# Patient Record
Sex: Male | Born: 1955
Health system: Southern US, Community
[De-identification: ages and names within clinical notes are randomized; demographics above are authoritative.]

## PROBLEM LIST (undated history)

## (undated) DIAGNOSIS — F329 Major depressive disorder, single episode, unspecified: Secondary | ICD-10-CM

## (undated) DIAGNOSIS — M109 Gout, unspecified: Secondary | ICD-10-CM

## (undated) DIAGNOSIS — E079 Disorder of thyroid, unspecified: Secondary | ICD-10-CM

## (undated) DIAGNOSIS — F32A Depression, unspecified: Secondary | ICD-10-CM

## (undated) DIAGNOSIS — G709 Myoneural disorder, unspecified: Secondary | ICD-10-CM

## (undated) HISTORY — DX: Disorder of thyroid, unspecified: E07.9

## (undated) HISTORY — DX: Major depressive disorder, single episode, unspecified: F32.9

## (undated) HISTORY — PX: ANKLE ARTHROSCOPY WITH RECONSTRUCTION: SHX5583

## (undated) HISTORY — PX: TONSILLECTOMY AND ADENOIDECTOMY: SUR1326

## (undated) HISTORY — DX: Myoneural disorder, unspecified: G70.9

## (undated) HISTORY — DX: Depression, unspecified: F32.A

## (undated) HISTORY — PX: CHOLECYSTECTOMY: SHX55

---

## 1999-04-29 ENCOUNTER — Encounter: Payer: Self-pay | Admitting: Emergency Medicine

## 1999-04-29 ENCOUNTER — Inpatient Hospital Stay (HOSPITAL_COMMUNITY): Admission: EM | Admit: 1999-04-29 | Discharge: 1999-05-12 | Payer: Self-pay | Admitting: Emergency Medicine

## 1999-04-29 ENCOUNTER — Encounter (INDEPENDENT_AMBULATORY_CARE_PROVIDER_SITE_OTHER): Payer: Self-pay | Admitting: *Deleted

## 1999-05-01 ENCOUNTER — Encounter: Payer: Self-pay | Admitting: Family Medicine

## 1999-05-02 ENCOUNTER — Encounter: Payer: Self-pay | Admitting: Thoracic Surgery (Cardiothoracic Vascular Surgery)

## 1999-05-03 ENCOUNTER — Encounter: Payer: Self-pay | Admitting: Cardiology

## 1999-05-03 ENCOUNTER — Encounter: Payer: Self-pay | Admitting: Family Medicine

## 1999-05-04 ENCOUNTER — Encounter: Payer: Self-pay | Admitting: Thoracic Surgery (Cardiothoracic Vascular Surgery)

## 1999-05-05 ENCOUNTER — Encounter: Payer: Self-pay | Admitting: Thoracic Surgery (Cardiothoracic Vascular Surgery)

## 1999-05-06 ENCOUNTER — Encounter: Payer: Self-pay | Admitting: Thoracic Surgery (Cardiothoracic Vascular Surgery)

## 1999-05-07 ENCOUNTER — Encounter: Payer: Self-pay | Admitting: Thoracic Surgery (Cardiothoracic Vascular Surgery)

## 1999-05-08 ENCOUNTER — Encounter: Payer: Self-pay | Admitting: Thoracic Surgery (Cardiothoracic Vascular Surgery)

## 1999-05-08 ENCOUNTER — Encounter: Payer: Self-pay | Admitting: Family Medicine

## 1999-05-18 ENCOUNTER — Encounter: Admission: RE | Admit: 1999-05-18 | Discharge: 1999-05-18 | Payer: Self-pay | Admitting: Family Medicine

## 1999-10-07 ENCOUNTER — Encounter: Payer: Self-pay | Admitting: *Deleted

## 1999-10-07 ENCOUNTER — Inpatient Hospital Stay (HOSPITAL_COMMUNITY): Admission: EM | Admit: 1999-10-07 | Discharge: 1999-10-10 | Payer: Self-pay | Admitting: Emergency Medicine

## 1999-10-07 ENCOUNTER — Encounter: Payer: Self-pay | Admitting: Emergency Medicine

## 1999-10-19 ENCOUNTER — Encounter: Admission: RE | Admit: 1999-10-19 | Discharge: 1999-10-19 | Payer: Self-pay | Admitting: Family Medicine

## 1999-11-07 ENCOUNTER — Inpatient Hospital Stay (HOSPITAL_COMMUNITY): Admission: EM | Admit: 1999-11-07 | Discharge: 1999-11-08 | Payer: Self-pay | Admitting: Emergency Medicine

## 1999-11-07 ENCOUNTER — Encounter: Payer: Self-pay | Admitting: Emergency Medicine

## 1999-11-15 ENCOUNTER — Encounter: Admission: RE | Admit: 1999-11-15 | Discharge: 1999-11-15 | Payer: Self-pay | Admitting: Family Medicine

## 1999-11-23 ENCOUNTER — Encounter: Admission: RE | Admit: 1999-11-23 | Discharge: 1999-11-23 | Payer: Self-pay | Admitting: Family Medicine

## 1999-11-28 ENCOUNTER — Encounter: Admission: RE | Admit: 1999-11-28 | Discharge: 1999-11-28 | Payer: Self-pay | Admitting: Family Medicine

## 1999-11-29 ENCOUNTER — Encounter: Admission: RE | Admit: 1999-11-29 | Discharge: 1999-11-29 | Payer: Self-pay | Admitting: Family Medicine

## 1999-12-07 ENCOUNTER — Encounter: Admission: RE | Admit: 1999-12-07 | Discharge: 1999-12-07 | Payer: Self-pay | Admitting: Family Medicine

## 1999-12-21 ENCOUNTER — Encounter: Admission: RE | Admit: 1999-12-21 | Discharge: 1999-12-21 | Payer: Self-pay | Admitting: Family Medicine

## 1999-12-28 ENCOUNTER — Encounter: Admission: RE | Admit: 1999-12-28 | Discharge: 1999-12-28 | Payer: Self-pay | Admitting: Family Medicine

## 2000-01-04 ENCOUNTER — Encounter: Admission: RE | Admit: 2000-01-04 | Discharge: 2000-01-04 | Payer: Self-pay | Admitting: Family Medicine

## 2000-01-15 ENCOUNTER — Encounter: Admission: RE | Admit: 2000-01-15 | Discharge: 2000-01-15 | Payer: Self-pay | Admitting: Family Medicine

## 2000-01-18 ENCOUNTER — Encounter: Admission: RE | Admit: 2000-01-18 | Discharge: 2000-01-18 | Payer: Self-pay | Admitting: Family Medicine

## 2000-01-25 ENCOUNTER — Encounter: Admission: RE | Admit: 2000-01-25 | Discharge: 2000-01-25 | Payer: Self-pay | Admitting: Family Medicine

## 2000-02-01 ENCOUNTER — Encounter: Admission: RE | Admit: 2000-02-01 | Discharge: 2000-02-01 | Payer: Self-pay | Admitting: Family Medicine

## 2000-02-08 ENCOUNTER — Encounter: Admission: RE | Admit: 2000-02-08 | Discharge: 2000-02-08 | Payer: Self-pay | Admitting: Family Medicine

## 2000-02-14 ENCOUNTER — Encounter: Admission: RE | Admit: 2000-02-14 | Discharge: 2000-02-14 | Payer: Self-pay | Admitting: Family Medicine

## 2000-02-21 ENCOUNTER — Encounter: Admission: RE | Admit: 2000-02-21 | Discharge: 2000-02-21 | Payer: Self-pay | Admitting: Family Medicine

## 2000-02-29 ENCOUNTER — Encounter: Admission: RE | Admit: 2000-02-29 | Discharge: 2000-02-29 | Payer: Self-pay | Admitting: Family Medicine

## 2000-03-05 ENCOUNTER — Encounter: Admission: RE | Admit: 2000-03-05 | Discharge: 2000-03-05 | Payer: Self-pay | Admitting: Family Medicine

## 2000-03-07 ENCOUNTER — Encounter: Admission: RE | Admit: 2000-03-07 | Discharge: 2000-03-07 | Payer: Self-pay | Admitting: Family Medicine

## 2000-03-14 ENCOUNTER — Encounter: Admission: RE | Admit: 2000-03-14 | Discharge: 2000-03-14 | Payer: Self-pay | Admitting: Family Medicine

## 2000-03-25 ENCOUNTER — Emergency Department (HOSPITAL_COMMUNITY): Admission: EM | Admit: 2000-03-25 | Discharge: 2000-03-26 | Payer: Self-pay | Admitting: Internal Medicine

## 2000-03-25 ENCOUNTER — Encounter: Payer: Self-pay | Admitting: Emergency Medicine

## 2000-07-30 ENCOUNTER — Emergency Department (HOSPITAL_COMMUNITY): Admission: EM | Admit: 2000-07-30 | Discharge: 2000-07-30 | Payer: Self-pay | Admitting: Emergency Medicine

## 2000-09-05 ENCOUNTER — Encounter: Payer: Self-pay | Admitting: Emergency Medicine

## 2000-09-05 ENCOUNTER — Emergency Department (HOSPITAL_COMMUNITY): Admission: EM | Admit: 2000-09-05 | Discharge: 2000-09-05 | Payer: Self-pay | Admitting: *Deleted

## 2000-09-21 ENCOUNTER — Emergency Department (HOSPITAL_COMMUNITY): Admission: EM | Admit: 2000-09-21 | Discharge: 2000-09-21 | Payer: Self-pay | Admitting: Emergency Medicine

## 2001-01-01 ENCOUNTER — Encounter: Payer: Self-pay | Admitting: Emergency Medicine

## 2001-01-01 ENCOUNTER — Emergency Department (HOSPITAL_COMMUNITY): Admission: EM | Admit: 2001-01-01 | Discharge: 2001-01-01 | Payer: Self-pay | Admitting: Emergency Medicine

## 2001-02-27 ENCOUNTER — Encounter: Payer: Self-pay | Admitting: Emergency Medicine

## 2001-02-27 ENCOUNTER — Emergency Department (HOSPITAL_COMMUNITY): Admission: EM | Admit: 2001-02-27 | Discharge: 2001-02-27 | Payer: Self-pay | Admitting: Emergency Medicine

## 2001-08-01 ENCOUNTER — Emergency Department (HOSPITAL_COMMUNITY): Admission: EM | Admit: 2001-08-01 | Discharge: 2001-08-01 | Payer: Self-pay | Admitting: Emergency Medicine

## 2001-08-01 ENCOUNTER — Encounter: Payer: Self-pay | Admitting: Emergency Medicine

## 2003-10-11 ENCOUNTER — Emergency Department (HOSPITAL_COMMUNITY): Admission: EM | Admit: 2003-10-11 | Discharge: 2003-10-11 | Payer: Self-pay | Admitting: Emergency Medicine

## 2003-10-15 ENCOUNTER — Inpatient Hospital Stay (HOSPITAL_COMMUNITY): Admission: RE | Admit: 2003-10-15 | Discharge: 2003-10-25 | Payer: Self-pay | Admitting: Psychiatry

## 2003-11-20 ENCOUNTER — Other Ambulatory Visit: Payer: Self-pay

## 2004-02-28 ENCOUNTER — Ambulatory Visit: Payer: Self-pay | Admitting: Family Medicine

## 2004-02-28 ENCOUNTER — Ambulatory Visit: Payer: Self-pay | Admitting: *Deleted

## 2004-06-08 ENCOUNTER — Emergency Department (HOSPITAL_COMMUNITY): Admission: EM | Admit: 2004-06-08 | Discharge: 2004-06-08 | Payer: Self-pay | Admitting: Emergency Medicine

## 2004-06-09 ENCOUNTER — Ambulatory Visit: Payer: Self-pay | Admitting: Family Medicine

## 2004-06-10 ENCOUNTER — Emergency Department (HOSPITAL_COMMUNITY): Admission: EM | Admit: 2004-06-10 | Discharge: 2004-06-10 | Payer: Self-pay | Admitting: Family Medicine

## 2004-06-14 ENCOUNTER — Ambulatory Visit: Payer: Self-pay | Admitting: Family Medicine

## 2004-06-16 ENCOUNTER — Emergency Department (HOSPITAL_COMMUNITY): Admission: EM | Admit: 2004-06-16 | Discharge: 2004-06-16 | Payer: Self-pay | Admitting: Family Medicine

## 2004-09-28 ENCOUNTER — Emergency Department (HOSPITAL_COMMUNITY): Admission: EM | Admit: 2004-09-28 | Discharge: 2004-09-28 | Payer: Self-pay | Admitting: Emergency Medicine

## 2004-09-28 ENCOUNTER — Ambulatory Visit: Payer: Self-pay | Admitting: Family Medicine

## 2004-09-28 ENCOUNTER — Ambulatory Visit (HOSPITAL_COMMUNITY): Admission: RE | Admit: 2004-09-28 | Discharge: 2004-09-28 | Payer: Self-pay | Admitting: Family Medicine

## 2004-10-02 ENCOUNTER — Ambulatory Visit: Payer: Self-pay | Admitting: Family Medicine

## 2004-10-13 ENCOUNTER — Emergency Department (HOSPITAL_COMMUNITY): Admission: EM | Admit: 2004-10-13 | Discharge: 2004-10-13 | Payer: Self-pay | Admitting: Emergency Medicine

## 2004-10-16 ENCOUNTER — Ambulatory Visit: Payer: Self-pay | Admitting: Family Medicine

## 2004-10-21 ENCOUNTER — Emergency Department (HOSPITAL_COMMUNITY): Admission: EM | Admit: 2004-10-21 | Discharge: 2004-10-21 | Payer: Self-pay | Admitting: Family Medicine

## 2004-10-25 ENCOUNTER — Ambulatory Visit: Payer: Self-pay | Admitting: Family Medicine

## 2004-11-07 ENCOUNTER — Emergency Department (HOSPITAL_COMMUNITY): Admission: EM | Admit: 2004-11-07 | Discharge: 2004-11-07 | Payer: Self-pay | Admitting: Emergency Medicine

## 2004-11-15 ENCOUNTER — Ambulatory Visit: Payer: Self-pay | Admitting: Nurse Practitioner

## 2004-11-22 ENCOUNTER — Ambulatory Visit: Payer: Self-pay | Admitting: Family Medicine

## 2004-12-01 ENCOUNTER — Emergency Department (HOSPITAL_COMMUNITY): Admission: EM | Admit: 2004-12-01 | Discharge: 2004-12-02 | Payer: Self-pay | Admitting: Emergency Medicine

## 2004-12-02 ENCOUNTER — Emergency Department (HOSPITAL_COMMUNITY): Admission: EM | Admit: 2004-12-02 | Discharge: 2004-12-03 | Payer: Self-pay | Admitting: Emergency Medicine

## 2004-12-07 ENCOUNTER — Inpatient Hospital Stay (HOSPITAL_COMMUNITY): Admission: EM | Admit: 2004-12-07 | Discharge: 2004-12-14 | Payer: Self-pay | Admitting: Psychiatry

## 2004-12-07 ENCOUNTER — Ambulatory Visit: Payer: Self-pay | Admitting: Psychiatry

## 2004-12-11 ENCOUNTER — Encounter: Payer: Self-pay | Admitting: Psychiatry

## 2007-06-29 ENCOUNTER — Emergency Department (HOSPITAL_COMMUNITY): Admission: EM | Admit: 2007-06-29 | Discharge: 2007-06-29 | Payer: Self-pay | Admitting: Family Medicine

## 2007-07-30 ENCOUNTER — Ambulatory Visit: Payer: Self-pay | Admitting: Vascular Surgery

## 2007-07-30 ENCOUNTER — Encounter (INDEPENDENT_AMBULATORY_CARE_PROVIDER_SITE_OTHER): Payer: Self-pay | Admitting: Emergency Medicine

## 2007-07-31 ENCOUNTER — Inpatient Hospital Stay (HOSPITAL_COMMUNITY): Admission: EM | Admit: 2007-07-31 | Discharge: 2007-08-02 | Payer: Self-pay | Admitting: Emergency Medicine

## 2007-08-01 ENCOUNTER — Encounter (INDEPENDENT_AMBULATORY_CARE_PROVIDER_SITE_OTHER): Payer: Self-pay | Admitting: Internal Medicine

## 2007-08-02 ENCOUNTER — Ambulatory Visit: Payer: Self-pay | Admitting: Psychiatry

## 2007-08-02 ENCOUNTER — Inpatient Hospital Stay (HOSPITAL_COMMUNITY): Admission: AD | Admit: 2007-08-02 | Discharge: 2007-08-06 | Payer: Self-pay | Admitting: Psychiatry

## 2007-08-14 ENCOUNTER — Emergency Department (HOSPITAL_COMMUNITY): Admission: EM | Admit: 2007-08-14 | Discharge: 2007-08-14 | Payer: Self-pay | Admitting: Emergency Medicine

## 2008-07-25 ENCOUNTER — Emergency Department (HOSPITAL_COMMUNITY): Admission: EM | Admit: 2008-07-25 | Discharge: 2008-07-25 | Payer: Self-pay | Admitting: Emergency Medicine

## 2008-08-01 ENCOUNTER — Emergency Department (HOSPITAL_COMMUNITY): Admission: EM | Admit: 2008-08-01 | Discharge: 2008-08-01 | Payer: Self-pay | Admitting: Emergency Medicine

## 2010-06-25 ENCOUNTER — Encounter: Payer: Self-pay | Admitting: Family Medicine

## 2010-10-17 NOTE — H&P (Signed)
NAME:  Paul Jefferson, Paul Jefferson NO.:  [id number]   MEDICAL RECORD NO.:  [id number]          PATIENT TYPE:  IPS   LOCATION:  0503                          FACILITY:  [facility]   PHYSICIAN:  Geoffery Lyons, M.D.      DATE OF BIRTH:  [birth year]   DATE OF ADMISSION:  08/02/2007  DATE OF DISCHARGE:                       PSYCHIATRIC ADMISSION ASSESSMENT   IDENTIFYING INFORMATION:  This is a voluntary admission to the services  of Dr. Geoffery Lyons.  This is a 55 year old single white male.  He was  admitted to the main hospital February 25 to February 28.  He had a  syncopal attack and he fell, enduring lacerations to his lower legs with  cellulitis.  He was admitted for antibiotic treatment, fluid treatment  of mild rhabdomyolysis, and is being transferred to the [facility] at this time for medication adjustment.  The patient was  seen in consult on February 26 by Dr. Geralyn Flash.  The patient is  well known to him, and at that time he was also treated for alcohol and  benzodiazepines dependence.  He was discharged on Campral, Seroquel,  Prozac, and was referred to [facility] at that  time for further followup.   PAST PSYCHIATRIC HISTORY:  He has a very long history for alcohol abuse  and infrequently attends [facility].  He has had  inpatient and outpatient treatment for years.   SOCIAL HISTORY:  He finished high school, he has never married, he has  no children.  He states he is currently employed and that he lives in an  apartment.  He frequently lives with and gets financial support from his  parents.   FAMILY HISTORY:  Depression.   ALCOHOL AND DRUG ABUSE:  He states his last use of alcohol was 6-8  months ago.   MEDICATIONS:  At the time of transfer from the hospital:  He was on  doxycycline 100 mg p.o. b.i.d. for 10 more days to treat his  cellulitis, Naprosyn enteric coated 500 mg p.o. b.i.d. for 2 more days  and then b.i.d. p.r.n. with food, Percocet 2 p.o. q.4-6h p.r.n.,  Protonix 40 mg p.o. daily, Prozac 20 mg p.o. daily, Klonopin 2 mg p.o.  daily, Seroquel 150 mg at h.s., and Synthroid 200 mcg p.o. daily.  He  was also to have wet to dry dressing changes to his right great toe  b.i.d. and he is to keep his legs elevated when in bed.   ALLERGIES:  No known drug allergies.   POSITIVE PHYSICAL FINDINGS:  PHYSICAL EXAMINATION:  They are well  documented and already on the chart.  Vital signs on admission show he  is 73 inches tall.  He weighs 240.5 pounds.  Temperature is 97.4, blood  pressure is 101/59 to 90/51, pulse is 72, respirations are 18.   MENTAL STATUS EXAM:  He is easily aroused.  He is appropriately groomed,  dressed and nourished.  His speech is not pressured.  His mood is  appropriate to the situation.  He affect has a normal range.  Thought  processes are clear, rational and goal oriented.  Judgment and insight  are good.  Concentration and memory are good.  Intelligence is at least  average.  He denies being suicidal or homicidal.  He denies auditory or  visual hallucinations.   ADMISSION DIAGNOSES:  AXIS I:  Alcohol and benzodiazepines dependence.  He also has major depressive disorder recurrent, without psychotic  features.  AXIS II:  Deferred.  AXIS III:  Cellulitis status post syncopal fall, hypothyroidism,  hypercholesterolemia, rhabdomyolysis, resolved, obesity.  AXIS IV:  Severe.  AXIS V:  Global assessment of function is 50.   PLAN:  The patient is to be detoxed from benzodiazepines and alcohol and  toward that end the low dose Librium protocol was initiated.  Other  medication adjustments will be made while on the floor, and he will  return to the [facility] for further followup  post discharge.   ESTIMATED LENGTH OF STAY:  Another 4-5 days.      Mickie Leonarda Salon, P.A.-C.      Geoffery Lyons, M.D.  Electronically Signed     MD/MEDQ  D:  08/03/2007  T:  08/03/2007  Job:  [id number]

## 2010-10-17 NOTE — H&P (Signed)
NAME:  Paul Jefferson, Paul Jefferson NO.:  [id number]   MEDICAL RECORD NO.:  [id number]          PATIENT TYPE:  OBV   LOCATION:  0107                         FACILITY:  [facility]   PHYSICIAN:  Hollice Espy, M.D.DATE OF BIRTH:  1955-12-18   DATE OF ADMISSION:  07/30/2007  DATE OF DISCHARGE:                              HISTORY & PHYSICAL   PRIMARY CARE PHYSICIAN:  Vikki Ports, M.D.   CONSULTING PHYSICIAN:  Antonietta Breach, M.D.   CHIEF COMPLAINT:  Fall.   HISTORY OF PRESENT ILLNESS:  The patient is a 55 year old white male  with past medical history of hypothyroidism, tobacco use, and  psychiatric disorder not otherwise specified who has been having  problems with his medications. He says his medications are constantly  adjusted and he at times gets very light-headed and dizzy and falls. He  fell yesterday leading to lacerations on both legs. He had planned to go  to [facility] for medication adjustments, but his legs started to  hurt so badly today that he came into the emergency room. In the  emergency room , he was evaluated and thought to perhaps have a  cellulitis. Labs were done on the patient and he was found to have a  white count of 16 as well as elevated CPK levels consistent with a fall.  The patient had an elevated D-dimer of 0.83; however, Doppler's  confirmed there was no evidence of deep venous thrombosis. With these  findings, it was felt the patient likely had a cellulitis secondary to  laceration and would need that to be treated before he could go over to  [facility]. Currently, he is doing well. He complains of some  mild leg pain but denies any headaches, vision changes, or dysphagia. No  chest pain, palpitations, shortness of breath, wheeze, cough, abdominal  pain, hematuria, dysuria, constipation, diarrhea, focal extremity  numbness, weakness, or pain other than described above.   REVIEW OF SYSTEMS:  Otherwise negative.   PAST  MEDICAL HISTORY:  Hypothyroidism, tobacco abuse, and psychiatric  disorder not otherwise specified.   MEDICATIONS:  1. Klonopin 2.  2. Daily Synthroid 200 mcg.  3. Daily Prozac 20.  4. Daily Seroquel 150 q.h.s.   ALLERGIES:  NO KNOWN DRUG ALLERGIES.   SOCIAL HISTORY:  He smokes about a half pack per day.   FAMILY HISTORY:  Noncontributory.   PHYSICAL EXAMINATION:  VITAL SIGNS: Patient's vitals on admission:  Temperature 98.6, heart rate 81, blood pressure 119/64, respirations 20,  02 saturation 97% on room air.  GENERAL: Alert and oriented x3, in no apparent distress.  HEENT:  He is normocephalic, atraumatic. His mucous membranes are  slightly dry. He has no carotid bruits.  HEART:  Regular rate and rhythm, S1 and S2.  LUNGS:  Clear to auscultation bilaterally.  ABDOMEN:  Soft, nontender, nondistended. Positive bowel sounds.  EXTREMITIES: He has evidence of 2 recent fresh lacerations, one about 9  to 10 inches over the left anterior leg below the knee, the other about  6 inches on the right anterior lower extremity below the knee. They  are  slightly tender to touch.   LAB WORK:  White count 16.1, H&H 10.9 and 31, MCV 90, platelet count  316, no shift. D-dimer is elevated at 0.83. CPK 341. MB 11.7. Troponin  less than 0.05. CPK 527. MB 11.8, troponin 0.03. Sodium 134, potassium  3.8, chloride 101, bicarb 24, BUN 17, creatinine 1.3, glucose 116.   ASSESSMENT/PLAN:  1. Fall with leg laceration leading to cellulitis. Treat with IV      Avelox and Doxycycline to cover both community-acquired methicillin-      resistant Staph aureus and gram negatives.  2. Psychiatric disturbance: The patient wants to go to Behavior Health      for medication adjustments. We will get Psych to see here. Social      Work consult to transfer over once white count is normalized.  3. Hypothyroidism: Continue Synthroid.  4. Tobacco abuse: Continue nicotine patch.  5. Increased CPK secondary to fall,  treat with IV fluids.      Hollice Espy, M.D.  Electronically Signed     SKK/MEDQ  D:  07/30/2007  T:  07/30/2007  Job:  [id number]   cc:   Vikki Ports, M.D.  Fax: [phone]   Antonietta Breach, M.D.

## 2010-10-17 NOTE — Discharge Summary (Signed)
NAME:  Paul Jefferson, Paul Jefferson NO.:  [id number]   MEDICAL RECORD NO.:  [id number]          PATIENT TYPE:  INP   LOCATION:  1531                         FACILITY:  [facility]   PHYSICIAN:  Kela Millin, M.D.DATE OF BIRTH:  29-Mar-1956   DATE OF ADMISSION:  07/30/2007  DATE OF DISCHARGE:                               DISCHARGE SUMMARY   DISCHARGE DIAGNOSES:  1. Probable syncope with fall.  2. Leg lacerations with cellulitis.  3. Major depressive disorder, recurrent - without psychotic features      per psychiatrist.  4. History of alcohol and benzodiazepine dependence.  5. Rhabdomyolysis, mild.  6. Hypothyroidism.  7. Tobacco abuse.  8. Left lower lobe pleural parenchymal thickening, band like - per CT      scan.  Follow-up CT scan of chest in three months per primary care      physician.   PROCEDURES AND STUDIES:  1. CT angiogram of chest - no evidence of pulmonary embolism.  Band-      like pleural parenchymal thickening in the superior segment of the      left lower lobe likely a chronic process.  However, recommend      follow-up CT of thorax in three months to assess interval growth      and exclude malignancy.  2. 2-D echocardiogram - normal left ventricular systolic function.      Ejection fraction 55-60%, no evidence of regional wall motion      abnormalities.  3. Carotid Doppler ultrasound done in December 2008 - no significant      ICA stenosis.  Bilateral mild intimal wall changes of the common      carotid.  4. Doppler ultrasound of lower extremities - no DVT.   CONSULTATIONS:  Psychiatry, Dr. Electa Sniff.   BRIEF HISTORY:  The patient is a 55 year old white male with above  listed medical problems who presented status post with lacerations on  both legs and swelling.  He wanted to go to [facility] for  adjustment of his medications, but he was having so much pain in his  legs that he decided to come to the ER to be evaluated.  In the ER he  was  found to have a white cell count of 16 and his CPK was elevated at  527.  He had Doppler ultrasound of his leg which showed no evidence of a  DVT.  He was admitted for further evaluation and management.   Please see the full admission History and Physical dictated on July 30, 2007 by Dr. Rito Ehrlich for the details of his admission physical exam  as well as the laboratory data.   HOSPITAL COURSE:  1. Probable syncope with fall - upon admission the patient had a D-      dimer done which was noted to be elevated at 0.83.  Initially lower      extremity ultrasounds of his legs were done, and these were      negative for a DVT.  A CT angiogram was later done that was      negative for pulmonary  embolus.  Workup for syncope included serial      cardiac enzymes which revealed normal troponins.  His total CPK's      were elevated, and this was attributed to rhabdomyolysis secondary      to his fall.  With hydration the rhabdomyolysis resolved.  A 2-D      echocardiogram was done to further evaluate the possible syncope      which was within normal limits as above.  It was noted that the      patient had previously been seen at [facility] for possible stroke in      December, and at that point had carotid Doppler ultrasounds which      showed no significant ICA stenosis.  The patient has not had any      further syncopal episodes here in the hospital.  The impression was      that his falls might have been related to dizziness that he has      been complaining of as a result of his psychiatric medications and      for which reason he needs to go to [facility] for adjustment      of his medications.  2. Fall with leg lacerations and cellulitis - the patient was started      on empiric antibiotics on admission.  It was noted that his white      cell count was elevated at 16.1 on admission.  With the IV      antibiotics his leukocytosis has improved to 11.2 on last check      prior to  discharge.  The patient has remained afebrile, and the      erythema in his legs improved.  His still has some pain from the      fall/lacerations, and is to continue pain medications upon      discharge.  3. Mild rhabdomyolysis - the patient was hydrated with IV fluids when      his total CPK reduced on recheck.  4. Hypothyroidism - the patient was maintained on his outpatient      medications during this hospital stay.  5. Major depressive disorder.  Recurrent alcohol and benzodiazepine      dependence - psychiatry was consulted, and Dr. Electa Sniff saw the      patient and recommended continuing his usual Prozac and also for      now his Xanax and Klonopin.  He stated that once the patient was      transferred to [facility] he would go through the      detoxification.  Dr. Electa Sniff also recommended to continue his usual Seroquel.   DISCHARGE MEDICATIONS:  1. Doxycycline 100 mg p.o. b.i.d. x10 more days.  2. Naprosyn enteric-coated 500 mg p.o. b.i.d. x2 days and then b.i.d.      p.r.n. with food.  3. Percocet two p.o. q.4-6 h. p.r.n.  4. Protonix 40 mg p.o. daily.  5. Prozac 20 mg p.o. daily.  6. Klonopin 2 mg p.o. daily.  7. Seroquel 150 mg p.o. q.h.s.  8. Synthroid 200 mg p.o. daily.   SPECIAL INSTRUCTIONS:  1. Wet to dry dressing changes to the one on his right great toe      b.i.d.  2. The patient is to keep his legs elevated when in bed.   DISCHARGE CONDITION:  Improved, stable.   FOLLOWUP CARE:  The patient is to follow up with Dr. Theresia Lo upon  discharge from [facility].  He is to call for an appointment.      Kela Millin, M.D.  Electronically Signed     ACV/MEDQ  D:  08/02/2007  T:  08/02/2007  Job:  [id number]   cc:   Vikki Ports, M.D.  Fax: [phone]

## 2010-10-17 NOTE — Consult Note (Signed)
NAME:  Paul Jefferson, Paul Jefferson NO.:  [id number]   MEDICAL RECORD NO.:  [id number]          PATIENT TYPE:  INP   LOCATION:  1531                         FACILITY:  [facility]   PHYSICIAN:  Anselm Jungling, MD  DATE OF BIRTH:  11/05/1955   DATE OF CONSULTATION:  07/31/2007  DATE OF DISCHARGE:                                 CONSULTATION   IDENTIFYING DATA AND REASON FOR REFERRAL:  The patient is a 55 year old  with cellulitis here at [facility].  He had been scheduled to  go into the [facility] for stabilization of mood disorder,  but he developed a cellulitis in the meantime and therefore is admitted  here to [facility] with the plan to transfer to [facility] when he is medically stable here.  Psychiatric consultation is  requested because of his status in which he is pending transfer to the  psychiatric facility.   HISTORY OF PRESENT ILLNESS:  The patient is well-known to me, having  been under my care in the inpatient psychiatric service approximately 20  months ago.  At that time, he was treated for alcohol and benzodiazepine  dependence and was discharged on a regimen of Campral, Seroquel, Prozac,  and referred to [facility] for further followup.   Today, the patient states that although he has had many followup visits  at [facility], that he continues to feel ongoing  problems with his moods, particularly anxiety and agitation.  He does  not feel that his medications are currently correct.   CURRENT PSYCHOTROPICS:  1. Prozac 20 mg daily.  2. Klonopin 2 mg daily.  3. Seroquel 150 mg q.h.s.  4. Xanax 0.5 mg t.i.d.   It is not clear why he is on both Xanax and Klonopin.   PAST MEDICAL HISTORY:  As above.  The patient also has a history of  hypothyroidism and hypercholesterolemia.   MENTAL STATUS EXAM:  The patient is a tall, paunchy gentleman who  appears well-nourished and normally  developed. __________ recently from  his treatment 2 years ago, but estimates that that was actually 5 years  ago, which is not the case.  He is not displaying any signs or symptoms  of psychosis or thought disorder.  His mood is depressed with dysphoric  and anxious affect.  When I let him know that the plan is for him to  transfer to [facility] once he is medically stabilized, he  expresses relief and gratitude.  There are no delusional statements.  There is nothing to suggest any underlying psychosis or thought  disorder.  He is not presenting with suicidal ideation, and as such,  there does not appear to be any concern about self-harm.   IMPRESSION:   AXIS I:  Major depressive disorder, recurrent, without psychotic  features; alcohol dependence, benzodiazepine dependence.   AXIS II:  Deferred.   AXIS III:  Cellulitis, hypercholesterolemia, hypothyroidism.   AXIS IV:  Stressors, severe.   AXIS V:  GAF 50.   RECOMMENDATIONS:  At this time, I would recommend continuing  his usual  Prozac.  He can be continued on his Xanax and Klonopin for now, although  when we transfer him to [facility], we will have him go through  detoxification.  It is suggested that we postpone the detoxification  process and the withdrawal of those medications until he is in  [facility].  If the process is started here at [facility], there  is a very good chance that he will bolt from treatment.   I have no other specific recommendations at this time.  It would also be  reasonable to continue his usual Seroquel and Prozac.  Thank you for  asking me to participate in this patient's care.      Anselm Jungling, MD  Electronically Signed     SPB/MEDQ  D:  07/31/2007  T:  07/31/2007  Job:  [id number]

## 2010-10-20 NOTE — H&P (Signed)
. [facility]  Patient:    Paul Jefferson, Paul Jefferson                         MRN: [id number] Adm. Date:  [id number] Attending:  Sanjuana Letters Dictator:   Talmage Nap, M.D. CC:         Raynelle Jan, M.D. at [facility]                         History and Physical  ADMISSION DIAGNOSES: 1. Chest pain, rule out myocardial infarction. 2. Depression. 3. Alcoholism. 4. Hypothyroidism. 5. History of migraines. 6. History of hemorrhoids. 7. Tobacco abuse. 8. History of a right empyema. Status post VATS May 01, 1999. 9. History of Vioxx and Ultram allergy with rash.  HISTORY OF PRESENT ILLNESS:  This is a 55 year old male admitted on Oct 07, 1999, for chest pain and an elevated troponin with a clean catheterization thought to have unexplained increased troponin with possible GI versus costochondritis chest pain. The patient improved upon discharge and followed up with Raynelle Jan, M.D. at [facility]. He was, at that time, setup for a GI evaluation on June 15. The patient presented this morning to [facility] ER for a two-day history of chest pain. He describes the pain as feeling like "there are bricks on my chest" and occasionally having sharp pains. They are located in the left chest and originally had some radiation to the left arm associated with numbness. The numbness has resolved, but chest pain continues, despite Demerol and morphine. The patient also reports a two-day history of shortness of breath and diaphoresis. He has had some nausea but no vomiting. He also reports having fallen in his house last evening. He denies any loss of consciousness. He originally denied any alcohol intake but after a 200-alcohol level reveals that he did drink one pint of vodka to try to help relieve the pain. The patient denies any change in his stools or any vomiting of blood.  PAST MEDICAL HISTORY: 1. Right empyema.  Status post VATS May 01, 1999. 2. Admission for chest pain May of 2001 with increased troponin but normal    catheterization thought not to be cardiac. 3. Depression. 4. Alcoholism. Reported quit December of 2000 but drank one pint of vodka last    night. 5. Hypothyroidism on Synthroid. 6. History of migraines. 7. History of hemorrhoids with guaiac-positive stools May of 2001. 8. History of tobacco abuse.  CURRENT MEDICATIONS: 1. Synthroid 0.2 mg q.d. 2. Doxepin 100 mg q.d. 3. Klonopin 2 mg b.i.d. 4. Midrin p.r.n. 5. Multivitamin q.d., currently not taking. 6. Protonix q.d., currently not taking. 7. Darvocet p.r.n., not taking.  ALLERGIES:  ULTRAM and VIOXX causing rash.  SOCIAL HISTORY:  The patient works for [employer] as a runner. He is an alcoholic who had reported quit in December of 2000 but currently drinking. Does smoke one pack per day of tobacco. Denies illicit drug use.  FAMILY HISTORY:  Diabetes and peripheral vascular disease in his father. Depression in his mother.  REVIEW OF SYSTEMS:  Positive for chest pain, shortness of breath, nausea, and diaphoresis. No PND. No edema. No orthopnea. Positive chronic cough, no increase currently. No nasal discharge. Positive for fall last night. Positive for headache. Negative for loss of consciousness. No urinary symptoms. No abdominal pain. No change in stools. No history of heartburn.  PHYSICAL EXAMINATION:  VITAL SIGNS:  Temperature is 96.7, blood pressure 101/50, heart rate 72, respiratory rate 27, O2 saturation 97% on room air.  GENERAL:  A male lying in bed with some diaphoresis in mild distress.  HEENT:  Pupils are equal, round, and reactive to light. Extraocular movements intact. TMs are clear. There is no nasal discharge. Throat is pink. There is a bright black eye.  NECK:  Reveals no JVD. No bruits. No adenopathy.  CHEST:  Clear to auscultation bilaterally with decreased breath sounds in  the bilateral bases. There is pain to palpation over the left chest.  CARDIOVASCULAR:  Reveals a regular rate and rhythm without murmur.  ABDOMEN:  Soft with positive bowel sounds, nontender, nondistended. No masses.  EXTREMITIES:  Without clubbing, cyanosis, or edema. There are scrapes and bruising on the patients right forearm and hand. Pulses are 2+ bilaterally.  RECTAL:  Deferred.  LABORATORY DATA:  UA negative. Urine drug screen negative. Alcohol level 211. Lipase 32, amylase 50. CMET within normal limits. Potassium 3.6. White count 13.2, hemoglobin 14, platelets 361. CK 202, with an MB of 1.8, relative index 0.9. Troponin 0.38.  Chest x-ray reveals chronic right lower lobe scarring.  EKG is sinus rhythm, no ST or T wave changes.  CT of the head is normal.  ASSESSMENT AND PLAN:  This is a 55 year old male admitted for chest pain that is atypical in nature and increased troponin.  1. Chest pain. Etiology is unclear. Troponin is elevated, but CK-MB and EKGs    are normal. This was similar to his admission in May of this year. The    patient does have a negative cardiac catheterization. Etiology of chest    pain includes possible rib fracture, costochondritis, gastroesophageal    reflux disease, esophageal spasm, coronary spasm, or psychological. Will    admit patient for observation, rule out myocardial infarction, and pain    control with Celebrex and morphine. Consider a gastrointestinal workup    sooner. Until then, start Protonix and obtain H. pylori. Again, etiology    of chest pain is likely not cardiac. 2. Hypothyroidism. Continue Synthroid, check a TSH. 3. Alcoholism. Watch for delirium tremens in patient. Will continue Klonopin    but consider another benzodiazepine if needed for withdrawal. Will give    multivitamin, thiamine, and folate. Offer assistance if needed. 4. Tobacco abuse. Will counsel on abuse, nicotine patch if needed. 5. Depression. Continue doxepin  and Klonopin. DD:  11/07/99 TD:  11/07/99 Job: [id number]

## 2010-10-20 NOTE — Cardiovascular Report (Signed)
[place]. [facility]  Patient:    Paul Jefferson, Paul Jefferson                         MRN: [id number] Proc. Date: 10/09/99 Adm. Date:  [id number] Disc. Date: [id number] Attending:  Garnette Scheuermann CC:         Sibyl Parr. Darrick Penna, M.D.             Luis Abed, M.D. [facility]             CV Laboratory             Delsa Grana. Andrey Campanile, M.D.                        Cardiac Catheterization  INDICATIONS: Mr. Pierre is a 55 year old who has had atypical chest pain, and thought to have musculoskeletal etiology. However, the troponins were positive and subsequently the patient was referred for cardiac catheterization.  PROCEDURES: Left heart catheterization, selective coronary angiography, selective left ventriculography, proximal root aortography.  DESCRIPTION OF PROCEDURE: The procedure was performed from the right femoral artery. After placement of an initial 6 French sheath, we noted that the blood pressure was in the range of about 80 in the central aorta. We then proceeded to give him IV fluids over approximately a 30 minute period to get his pressure up. There was never any bradycardia. He was somewhat claustrophobic and we did give him 3 mg of intravenous Versed. The procedure was performed without complication.  HEMODYNAMICS: The initial central aortic pressure was 77/51. LV pressure 85/12, after intravenous fluid administration. There was no gradient on pullback across the aortic valve.  ANGIOGRAPHIC DATA:  LEFT VENTRICULOGRAPHY: Ventriculography in the RAO projection reveals preserved global systolic function. No segmental abnormalities contraction were identified. Ejection fraction was not calculated because of ectopy. However, the overall systolic function appeared to be well-preserved without segmental wall motion abnormality.  The proximal aortic root appeared to be a normal sized aortic root. No significant aortic regurgitation was noted.  The left main coronary  artery was free of significant disease.  The left anterior descending artery coursed through the apex. There was one large diagonal branch and one somewhat smaller diagonal branch. Distal LAD wrapped the apex. No critical lesions were noted.  The circumflex provided a large marginal branch and an AV circumflex. Other than minor luminal irregularity, no critical lesions were noted.  The right coronary artery was a dominant vessel providing a posterior descending branch and two good sized posterolateral branches. No critical disease was noted.  CONCLUSIONS: 1. Normal left ventricular function. 2. No aortic root dissection and no significant aortic root regurgitation. 3. No high-grade coronary stenoses.  DISPOSITION: I have discussed the case with Dr. Myrtis Ser. We will obtain a 2-D echocardiogram. No other studies will be performed. DD:  10/09/99 TD:  10/11/99 Job: [id number]

## 2010-10-20 NOTE — Discharge Summary (Signed)
NAME:  Paul Jefferson, Paul Jefferson NO.:  [id number]   MEDICAL RECORD NO.:  [id number]          PATIENT TYPE:  IPS   LOCATION:  0503                          FACILITY:  [facility]   PHYSICIAN:  Geoffery Lyons, M.D.      DATE OF BIRTH:  02/14/1956   DATE OF ADMISSION:  08/02/2007  DATE OF DISCHARGE:  08/06/2007                               DISCHARGE SUMMARY   CHIEF COMPLAINT/HISTORY OF PRESENT ILLNESS:  This was the first  admission to [facility] for this 55 year old single  white male admitted to the main hospital February 25 to February 28 for  syncopal attack and he fell enduring lacerations to his lower legs with  cellulitis.  He was admitted for antibiotic treatment, fluid treatment  of mild abdominal aortic disease, and he is being transferred to  [facility].  Seen in consult February 26 by Dr. Electa Sniff.  He was  also treated for alcohol and benzodiazepine dependence.  Discharged on  Campral, Seroquel, Prozac.  Referred to the [facility].   PAST PSYCHIATRIC HISTORY:  He has a long history of alcohol abuse.  Infrequently attends [facility] mental [facility] center.  Has had  inpatient and outpatient treatment for years.   MEDICAL HISTORY:  Alcohol and drug history as already stated, alcohol  and benzodiazepine abuse, rule out dependence.  Cellulitis, status post  syncopal, hypothyroidism, hypercholesterolemia, rhabdomyolysis resolved.   MEDICATIONS:  1. Doxycycline 100 mg twice a day.  2. Naproxen 500 mg twice a day.  3. Percocet two every 4-6 hours as needed for pain.  4. Protonix 40 mg per day.  5. Prozac 20 mg per day.  6. Klonopin 2 mg per day.  7. Seroquel 150 at bedtime.  8. Synthroid 200 mcg per day.   PHYSICAL EXAMINATION:  Failed to show any acute findings.   LABORATORY WORKUP:  Sodium 139, potassium 3.6, glucose 97.  SGOT 19,  SGPT 19.  Total bilirubin 0.7.  TSH 0.129, T4 1.82, free T3 3.6.   MENTAL STATUS EXAM:  Reveals male  appropriately groomed and dressed.  Speech was normal in rate, tempo, and production.  Mood is anxious,  depressed.  Affect was broad.  Thought processes were clear, rational,  goal oriented.  No active suicidal or homicidal ideas, no delusions, no  hallucinations.  Cognition well-preserved.   IMPRESSION:  AXIS I:  Alcohol and benzodiazepine dependence, major  depressive disorder.  AXIS II:  No diagnosis.  AXIS III:  Cellulitis status post syncopal fall, hypothyroidism,  hypercholesterolemia, rhabdomyolysis which was resolved.  AXIS IV:  Moderate.  AXIS V:  Upon admission 35-40, highest GAF in the last year 65-70.   COURSE IN THE HOSPITAL:  He was admitted.  Started in individual and  group psychotherapy.  He was detoxified with Librium.  He was maintained  on the same medication he was taking the in medical unit except Klonopin  and that was discontinued.  As already stated 55 year old single white  male with long history of alcohol and drug abuse presented for detox  March 2.  Endorsed he was very  nauseous, unable to eat.  Wanted to get  his medication adjusted; minimize his substance abuse.  Claimed that two  weeks prior to this admission he went to [facility].  Medications  were adjusted then, but he is not feeling any better.  The chart  suggested alcohol, benzodiazepine, opiate abuse.  He minimized.  He  continued to endorse nausea, vomiting once.  March 3 he was still having  a hard time being that his medications were not working well, feeling  nauseated.  Claimed he was not drinking or using benzodiazepines.  Felt  that he could not explain what was going on.  He endorsed that he was  uncomfortable with the physician at [facility].  Wanted to change  medications.  He endorsed he was not suicidal and that has not been his  problem.  He was wanting outpatient treatment.  On March 4 he was in  full contact with reality.  There were no active suicidal or homicidal   ideas, no hallucinations or delusions.  He was physically better, able  to be up and about, tolerating medications well, active in milieu, felt  that he was ready and stable to go home.   DISCHARGE DIAGNOSES:  AXIS I:  Alcohol and benzodiazepine dependent,  mood disorder NOS.  AXIS II:  No diagnosis.  AXIS III:  Cellulitis status post syncope, hypothyroidism,  hypercholesterolemia, rhabdomyolysis resolved.  AXIS IV:  Moderate.  AXIS V:  Upon discharge 50-55.   DISCHARGE MEDICATIONS:  Discharged on Protonix 40 mg per day, Seroquel  50 in the morning and 150 at night, doxycycline 100 mg twice a day,  Synthroid 200 mcg per day, Robaxin 500 mg one four times a day,  trazodone 100 1/2 to one at night for sleep.   FOLLOWUP:  Dr. Lang Snow at [facility].      Geoffery Lyons, M.D.  Electronically Signed     IL/MEDQ  D:  09/03/2007  T:  09/04/2007  Job:  [id number]

## 2010-10-20 NOTE — Discharge Summary (Signed)
NAME:  Paul Jefferson, Paul Jefferson NO.:  [id number]   MEDICAL RECORD NO.:  [id number]          PATIENT TYPE:  IPS   LOCATION:  0505                          FACILITY:  [facility]   PHYSICIAN:  Anselm Jungling, MD  DATE OF BIRTH:  1956-02-09   DATE OF ADMISSION:  12/07/2004  DATE OF DISCHARGE:  12/14/2004                                 DISCHARGE SUMMARY   IDENTIFYING DATA AND REASON FOR ADMISSION:  This was the second [facility]  admission for Paul Jefferson, a 55 year old separate Caucasian male, who was admitted  for alcohol and benzodiazepine detoxification.  He had presented himself for  treatment elsewhere, prior to coming to [facility] for inpatient psychiatric  hospitalization.   HISTORY OF PRESENTING PROBLEMS:  The patient had been consuming in excess of  one fifth of hard liquor per day, and in addition had been using Klonopin 4  mg daily.  He had previously been detoxed at [facility] in May 2005.  He had a  history of alcohol withdrawal seizures previously.   MEDICATIONS ON ADMISSION:  1.  Prozac 60 mg daily.  2.  Trazodone 100 mg q.h.s.  3.  Klonopin 2 mg b.i.d.  4.  Pravachol 10 mg daily.  5.  Levothyroxine 175 mcg daily.   INITIAL DIAGNOSTIC IMPRESSION:  AXIS I:  Alcohol and benzodiazepine  dependence.  Possible substance-induced mood disorder.  AXIS II:  Deferred.  AXIS III:  History of hypothyroidism.  Right shoulder injury.  AXIS IV:  Stressors severe.  AXIS V:  Global assessment of function on admission 30.   MEDICAL AND LABORATORY:  There were no significant medical issues outside of  his alcohol withdrawal during this brief hospital stay.  The patient showed  a leukocytosis of 15,100 on admission.  Because he was a smoker, a chest x-  ray was obtained which showed no acute changes.  Because of his history of  hypothyroidism, thyroid studies were obtained which showed an elevated TSH  of 6.4, decreased T3 uptake at 34.5 and decreased T4 at 0.78.  He was  continued on his usual  levothyroxine 175 mcg per day.  Other laboratory was  significant for AST of 58 and ALT of 42, both presumed to be related to his  alcohol use.   HOSPITAL COURSE:  The patient was admitted to the adult inpatient service  where he was placed on both Librium and phenobarbital withdrawal protocols.  He was continued on Prozac 20 mg daily, but this was increased to 40 mg  daily in hopes of a better antidepressant response.  Trazodone 50 mg at h.s.  was given on a p.r.n. basis for insomnia.  To further help with daytime  agitation and restlessness, Seroquel 25 mg t.i.d. and 100 mg h.s. was added.  This was beneficial and well tolerated.  Subsequently the dose was increased  to 50 mg t.i.d. with 100 mg q.h.s.   Later in the patient's hospital stay, Campral 666 mg t.i.d. was added for  alcohol cessation.   Throughout the early part of the patient's stay, he showed significant  alcohol withdrawal symptoms with  shaking, tremulousness, sweating and  disturbed sleep.  However, he did not experience any extreme signs or  symptoms of alcohol withdrawal such as seizure or DTs.  His symptoms  appeared to be adequately managed by the above-referenced medication  regimen.   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  However, he was absent any  further alcohol withdrawal symptoms.   AFTERCARE:  The patient was to follow up at the [facility] on December 19, 2004 to see Dr. Lang Snow for medication followup.   DISCHARGE MEDICATIONS:  1.  Campral 666 mg t.i.d.  2.  Seroquel 50 mg t.i.d. and 100 mg q.h.s.  3.  Prozac 40 mg daily.  4.  Librium 25 mg t.i.d. x 1 day, then Librium 25 mg b.i.d. x 3 days, then      Librium 25 mg daily x 3 days, then discontinue Librium.  5.  Levothyroxine 175 mcg daily.  6.  Pravachol 10 mg p.o. daily.   DISCHARGE  DIAGNOSES:  AXIS I:  Alcohol and benzodiazepine dependence.  Substance-induced mood disorder.  AXIS II:  Deferred.  AXIS III:  Hypothyroidism.  Right shoulder injury.  AXIS IV:  Stressors severe.  AXIS V:  Global assessment of function on discharge 55.   The patient was urged to have his thyroid status monitored on an ongoing  basis through his usual medical Zeki Bedrosian, which is [facility].     _______________    SPB/MEDQ  D:  01/03/2005  T:  01/03/2005  Job:  [id number]

## 2010-10-20 NOTE — H&P (Signed)
[place]. [facility]  Patient:    Paul Jefferson                          MRN: [id number] Adm. Date:  [id number] Attending:  Gwendolyn Lima. Dictator:   Guadalupe Dawn, M.D.                         History and Physical  PROBLEM LIST: 1. Cough with associated fever and chills x 1 month. 2. Weight loss of 15 pounds over 1 month. 3. Tobacco abuse.  HISTORY OF PRESENT ILLNESS:  This is a 55 year old white male in good health until 1 month ago when he developed cough that was dry.  He tried over the counter medications with no improvement.  He states cough was worse at nights.  After 2-3 weeks cough became minimally productive of a little white sputum.  He had one episode of "smothering" with a coughing spasm and then had sudden onset of acute pain.  He states that he heard and felt some ribs on the right side of his chest crack.  He states now when he lays flat on his back he develops severe coughing and a "drowning feeling".  He does also have dyspnea on exertion.  He admits to poor appetite with approximately 15 pound weight loss over the past month with mild nausea and no vomiting.  He has no diarrhea.  He has had an increased fluid intake. He admits to sore back worse with coughing.  He states the fever is worse at night with a high of 102.2.  He denies exposure to any one with known tuberculosis. e denies history of IV drug use, blood transfusion, travel to [place], multiple sexual partners past or present.  PAST MEDICAL HISTORY:   1. Hypothyroidism.  2. Depression.  3. 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  PAST SURGICAL HISTORY:  Tonsillectomy 55 years old.  SOCIAL HISTORY:  Unemployed, was previously a Music therapist.  Smoked one pack per day x 30 years.  Denies alcohol or drug use.  FAMILY HISTORY:  His father has diabetes mellitus and also some aunts have diabetes mellitus.  Father and mother have had cardiac catheterizations.  He has one uncle with  lung cancer.  MEDICATIONS:  1. Levothroid 0.175 mg q.d.  2. Doxepin 100 mg p.o. q.h.s. 3. Effexor 75 mg p.o. q.a.m.  4. Klonopin 1-2 mg p.r.n. 5. Aleve every day. 6. Tylenol p.r.n.  7. Cough syrup.  ALLERGIES:   No known drug allergies.  REVIEW OF SYSTEMS:  Negative except for HPI.  PHYSICAL EXAMINATION:  VITAL SIGNS:  Temperature 96.8, blood pressure 96/61, pulse 93, oxygen saturation 97% on room air, respiratory rate 20.  GENERAL:   He is ill-appearing in no acute distress, alert and oriented x 3.  HEENT:  [place]/AT, PERRL, EOMI, fundi within normal limits, oropharynx within normal limits.  Tympanic membranes normal bilaterally.  NECK:  Supple, no lymphadenopathy, no JVD, no thyromegaly.  CHEST:  Left side lung fields are normal, right base with markedly decreased breath sounds and mid lung fields on the right have crackles and decreased breath sounds. There is egophony on the right.  CARDIOVASCULAR:  Regular rate and rhythm, no murmur or gallop.  ABDOMEN:  Soft, nondistended with normoactive bowel sounds.  Tender right upper  quadrant, no rebound or guarding, no mass.  RECTAL:  Normal tone, heme negative stool.  EXTREMITIES:  No clubbing, cyanosis,  or edema.  Posterior pulses peripherally neurologic, grossly intact.  LABORATORY:  Chest x-ray shows a right middle lobe infiltrate with tracking of ____ chest wall.  HIV pending, CBC pending, PPD placed, sputum and blood cultures pending, chest T pending.  IMPRESSION:  A 55 year old white male with 1 month history of constitutional signs and symptoms of infection and chest x-ray with significant pneumonia, possible empyema.  He also has a history of hypothyroidism and tobacco abuse.  PLAN: 1. Admit to a medical bed. 2. Treat pneumonia/possible empyema with Rocephin 2 g IV q.24h. and    Clindamycin 900 mg IV q.8h. 3. Rule out cancers and abscess with chest CT. 4. Left lateral decubitus film to evaluate empyema  fluid. 5. Blood and sputum cultures. 6. Read PPD in 48-72 hours. 7. IV fluid resuscitation. 8. Analgesia for possible rib fractures.DD:  04/29/99 TD:  04/29/99 Job: [id number]

## 2010-10-20 NOTE — Discharge Summary (Signed)
[place]. [facility]  Patient:    Paul Jefferson, Paul Jefferson                         MRN: [id number] Adm. Date:  [id number] Disc. Date: [id number] Attending:  Sanjuana Letters Dictator:   Doren Custard, M.D.                           Discharge Summary  DATE OF BIRTH: 10-Feb-1956  DISCHARGE DIAGNOSES:  1. Noncardiac chest pain.  2. Hypothyroidism.  3. Depression.  4. Alcoholism.  5. Tobacco abuse.  DISCHARGE MEDICATIONS:  1. Synthroid 0.2 mg p.o. q.d.  2. Doxepin 100 mg p.o. q.d.  3. Klonopin 2 mg p.o. b.i.d.  4. Protonix 40 mg p.o. b.i.d.  5. Naprosyn 500 mg p.o. b.i.d. p.r.n. pain (total of #30 given).  PROCEDURES:  1. Chest x-ray showing normal cardiac size, no focal consolidation, and right     scar secondary to an old empyema.  2. CT of the head, negative.  3. Left rib detail, negative.  HISTORY OF PRESENT ILLNESS: This patient is a 55 year old white male with recurrent chest pain, pneumonia/empyema/VATS in November 2000, admitted for chest pain, rule out MI in May 2001, with normal cardiac catheterization but positive troponin.  He came in on November 07, 1999 complaining of worsening chest pain x 3 days and was drinking after four to six months of abstinence.  He reported he was doing some significant strenuous activity three days prior to admission and chest pain has been going on since that time.  He had a normal EKG and troponin of 0.38, and was thus admitted for chest pain and to rule out MI.  HOSPITAL COURSE: #1 - CHEST PAIN: The patient had serial cardiac enzymes and CKs and MBs were all negative.  Two troponins were 0.38 and 0.57 (this consistent with mildly elevated troponins on previous admission).  Repeat EKG showed no ischemia changes.  His pain was controlled with morphine initially and then this was stopped and his pain was minor after that time.  The pain was worse with deep inspiration and chest wall compression, felt most likely  to be musculoskeletal in origin.  #2 - SUBSTANCE ABUSE: The patient has a history of alcoholism and reported abstinence for six months prior to a drinking episode prior to this admission. He was continued to continue abstinence from alcohol and tobacco and the patient indicated that he would avoid alcohol.  #3 - DEPRESSION: The patient does not meet criteria for major depression at this time but will follow up with Dr. Rondel Jumbo at the [facility] for management of elements of depression and anxiety as well as stress management.  #4: LEUKOCYTOSIS: The patient was afebrile and had no signs or symptoms of infection during this admission.  It was felt this might be white cell demargination.  DISCHARGE LABORATORY DATA: Sodium 130, potassium 3.9, chloride 97, bicarbonate 25, glucose 77, BUN 11, creatinine 0.9.  Calcium 8.6.  Hemoglobin 11.9, hematocrit 33.3, platelets 278,000, WBC 17.0.  Troponin #1 was 0.38, troponin #2 was 0.57.  CK #1 was 202, MB 1.8, CK #2 was 205, MB 3.1, CK #3 was 141, MB 0.9.  FOLLOW-UP:  1. The patient is to call Dr. Rondel Jumbo for follow-up in one to two     days.  2. The patient is to follow up with Dr. Joetta Manners on  November 28, 1999 at     2:45 p.m.  3. The patient is to call or return to the clinic if he develops chest or     abdominal pain.  SPECIAL INSTRUCTIONS: The patient was instructed to avoid alcohol.DD:  11/08/99 TD:  11/13/99 Job: [id number]

## 2010-10-20 NOTE — Discharge Summary (Signed)
[place]. [facility]  Patient:    EATON, FOLMAR                         MRN: [id number] Adm. Date:  [id number] Disc. Date: [id number] Attending:  Kelvin Cellar Dictator:   Raynelle Jan                           Discharge Summary  DATE OF BIRTH:  [birth year]  DISCHARGE DIAGNOSES: 1. Chest pain, likely either costochondritis or gastroesophageal reflux    disease in nature. 2. Guaiac positive stools. 3. Depression. 4. Hypothyroidism. 5. History of a right empyema, status post VATS May 01, 1999.  DISCHARGE MEDICATIONS: 1. Levothyroxine 200 mcg 1 tablet p.o. q.d. 2. Doxepin 100 mg p.o. q.d. 3. Klonopin 2 mg p.o. b.i.d. 4. Midrin p.r.n. 5. Multivitamin p.o. q.d. 6. Protonix 40 mg p.o. q.d. 7. Percocet 5/325 mg 1-2 tablets every 6 hours p.r.n. pain.  DISCHARGE DIET:  He is to be discharged on a low fat, low salt diet.  DISCHARGE FOLLOW-UP: 1. He is to follow up with myself at [facility] on May 17 at    2:45 p.m. 2. The patient is to follow up with [facility] on Tuesday, May 29, at 2 p.m.    to see Dr. Russella Dar for an outpatient work-up for his guaiac positive stools    and to rule out GI etiologies of his chest pain.  CONSULTATIONS:  Dr. Myrtis Ser with [facility].  PROCEDURES: 1. The patient underwent cardiac catheterization on Oct 09, 1999, showing    essentially no disease and a normal left ventricular function. 2. 2-D echocardiogram.  The patient underwent 2-D echocardiogram on Oct 10, 1999, which showed normal left ventricular ejection fraction, trace mitral    regurgitation, mild tricuspid regurgitation, essentially otherwise normal.  ADMISSION HISTORY:  Briefly, the patient is a 55 year old white male with a history of a right lung empyema status post VATS procedure in November 2000, who presented to [facility] Emergency Department with substernal chest pain, 8/10 at its worst but tended to be worse with deep  breaths, exertion, and movement of the arms.  He stated that relaxation tended to help the pain; however, it was accompanied by mild dyspnea, nausea, vomiting x 1, and some intermittent diaphoresis.  He had had an occasional nonproductive cough x 2 weeks which he attributed to allergies.  On exam in the emergency department, cardiovascularly, he was found to have a regular rate and rhythm with no murmur, rub or gallop.  His lungs were notable for decreased breath sounds in the right base but were otherwise clear.  An EKG showed a normal sinus rhythm but no acute ST or T wave changes.  First set of cardiac enzymes showed a CK of 97, MB fraction of 0.8, index 0.8, troponin elevated at .47.  Chest x-ray in the emergency department showed some bibasilar atelectasis, no active disease or COPD.  The patient was therefore admitted for rule out MI given his elevated enzymes.  HOSPITAL COURSE: #1  - CHEST PAIN:  The patient was placed on low-dose beta-blocker, IV heparin and IV nitroglycerin and given morphine for the pain.  Serial enzymes x 4 revealed elevation of his troponins; however, normal CK and CK-MBs. Cardiology consult was obtained on Oct 08, 1999, and given his presentation, the patient was therefore brought to the catheterization  lab to rule out coronary artery disease.  The patient underwent catheterization on Oct 08, 1999, which showed normal coronary arteries and a normal left ventricular function.  Per cardiologys recommendations, a 2-D echocardiogram was obtained to assess ejection fraction and function which was essentially normal. Therefore, it was felt that his chest pain was likely due to another etiology such as GERD or costochondritis.  He was therefore discharged to home on Protonix 40 mg p.o. q.d. and Percocet 5/325 mg 1-2 tablets every 6 hours as needed for pain.  #2 - GUAIAC POSITIVE STOOLS:  On admission, the patients rectal exam was found to be guaiac positive.  He had  no history of GERD; however, given his symptomatology, it was felt that an outpatient work-up would certainly be in order to rule out some sort of gastritis or peptic ulcer disease.  He was therefore scheduled with [facility] on an outpatient basis for work-up and was placed on Protonix at discharge.  #3 - DEPRESSION AND ANXIETY:  The patient remained on his home medications, including Doxepin 100 mg p.o. q.d. and Klonopin 2 mg p.o. b.i.d. DD:  06/17/00 TD:  06/17/00 Job: [id number]

## 2010-10-20 NOTE — Op Note (Signed)
[place]. [facility]  Patient:    Paul Jefferson                          MRN: [id number] Proc. Date: 05/01/99 Adm. Date:  [id number] Attending:  Sanjuana Letters                           Operative Report  PREOPERATIVE DIAGNOSES: 1. Right pneumonia. 2. Possible lung abscess. 3. Right empyema.  POSTOPERATIVE DIAGNOSES: 1. Right pneumonia. 2. Lung abscess with empyema.  OPERATIONS PERFORMED: 1. Bronchoscopy. 2. Right VATS. 3. Decortication.  SURGEON: Salvatore Decent. Dorris Fetch, M.D.  ASSISTANT: Areta Haber, P.A.  ANESTHESIA: General.  FINDINGS: Bronchoscopy:  Normal endobronchial anatomy.  No endobronchial lesions. Mucosal irritation of the airways in the right lung.  Thoracoscopy: Complex empyema with extensive fibrinous exudate.  Thickened pleural peel.  Moderate visceral pleural peel.  Abscess, right middle lobe.  CLINICAL NOTE:  Paul Jefferson is a 55 year old gentleman who had been in good health until approximately a month prior to admission when he developed upper respiratory symptoms.  The patient did not seek medical attention until his condition worsened.  He began experiencing pleuritic chest pain, frequent cough and fever to 102. he patient was admitted and treated with intravenous antibiotics.  The patient chest x-ray showed a complex right effusion.  CT scan was performed, which showed multiple infiltrates on the right side and possible right middle lobe lung abscess as well as probable empyema.  Possible therapeutic options were discussed with the patient.  It was felt the ost definitive option would be bronchoscopy to rule out an endobronchial lesion followed by a VATS for drainage of peripneumonic effusion and decortication if n empyema was, in fact, present.  The indications, risks and benefits of this, as well as the alternative modes of therapy, were discussed in detail with the patient.  He understood and  agreed to proceed.  OPERATIVE NOTE:  Paul Jefferson was brought to the Preoperative Holding Area on May 01, 1999.  Arterial blood pressure monitoring line was placed and intravenous access was obtained.  The patient was brought to the Operating Room, anesthetized and intubated with a single lumen endotracheal tube.  Flexible fiberoptic bronchoscopy was carried out.  There were no endobronchial lesions noted.  There was mucosal inflammation throughout the right bronchial tree.  Washings were performed for both cytology and cultures.  Cultures were sent for bacterial, fungal, viral and acid fast bacilli.  The patient then was reintubated with a double lumen endotracheal tube.  He was  placed in the left lateral decubitus position and  the right chest was prepped nd draped in the usual fashion.  An incision in the posterior axillary line in the  seventh intercostal space and carried through the skin and subcutaneous tissue.  Hemostasis was achieved with cautery.  A single lung ventilation was carried out with the left lung only being inflated.  The chest was carefully entered above the eighth rib using a hemostat.  A finger was placed into the chest cavity and gelatinous material could be felt.  Fluid as evacuated.  Specimens were sent for pH, LDH, glucose, protein and cell counts. A separate specimen was sent for bacterial, fungal, viral and acid fast bacilli cultures.   Finally a separate specimen was sent  for cytology.  A thoracoscope was inserted.  A complex peripneumonic effusion with extensive fibrinous exudate and multiple  loculations were identified.  There was a thick parietal pleural peel consistent with empyema as well as moderate peel on the visceral pleural.  A second incision was made posteriorly in approximately the fifth intercostal space.  The chest was entered under thoracoscopic visualization. The fibrinous exudative adhesions then were taken down from  between the visceral and parietal pleura.  Multiple fluid collections were entered and were evacuated. The thick peel on the parietal pleura then was taken off using blunt dissection. This was a very inflamed pleural surface and there was diffuse bleeding from the underlying surface.  Specimens were sent of the peel for both cultures and pathology.  No tumor masses were seen.  The peel then was taken off the visceral surface of the lung.  This was primarily over the lower and middle lobes.  This  came off easily with peeling the exudate off the middle lobe.  A lung abscess was unroofed.  There was purulent drainage.  This was likewise cultured.  The apex f the lung was densely adherent and was not taken down.  Considerable time was taken debriding this peel throughout the lung cavity.  The chest was then copiously irrigated with warm Saline solution. Three chest tubes were placed through separate incisions, they were directed to the apex anteriorly and posteriorly, and a third tube was placed along the diaphragmatic surface of the lung.  The chest tubes were placed to suction.  The remaining incision was closed with 0-Vicryl figure-of-eight fascial sutures, 2-0 Vicryl subcutaneous suture and 3-0 Vicryl subcuticular suture.  All sponge, needle and instrument counts were correct at the end of the procedure.  The patient was taken from the Operating Room extubated to the Recovery Room in  stable condition. DD:  05/02/99 TD:  05/02/99 Job: [id number]

## 2010-10-20 NOTE — H&P (Signed)
[place]. [facility]  Patient:    Paul Jefferson, Paul Jefferson                         MRN: [id number] Adm. Date:  10/07/99 Attending:  Sibyl Parr. Darrick Penna, M.D. Dictator:   Cheree Ditto, M.D. CC:         [facility]                         History and Physical  CHIEF COMPLAINT:  Chest pain.  HISTORY OF PRESENT ILLNESS:  The patient is a 55 year old male with history of right lung empyema, status post November of 2000 VATS procedure presents with substernal chest pain since Friday a.m. at 2:30. He developed the pain while he was in bed. The chest pain, 8/10 at its worse, waxing and wanes but does not go completely away. The pain is worse with deep breathing, exertion, and moving his arms. Relaxing helps the pain. He did have vomiting x 1 and has occasional nausea with the pain. He is mildly short of breath. No radiation. He has intermittent diaphoresis. No fevers. Occasional nonproductive cough for two weeks which he attributes to allergies. No history of reflux. His pain continued throughout last night and this a.m.  PAST MEDICAL HISTORY/PAST SURGICAL HISTORY: 1. Right lung empyema, status post VATS on May 01, 1999. 2. Depression. 3. Status post tonsillectomy. 4. Hypothyroidism. 5. History of migraines.  ALLERGIES:  He gets a diffuse rash with VIOXX and ULTRAM.  CURRENT MEDICATIONS: 1. Levothyroxine 0.002 q.d. 2. Doxepin 100 mg q.d. 3. Klonopin 2 mg b.i.d. 4. Midrin p.r.n. 5. Multivitamin q.d.  SOCIAL HISTORY:  He is a former heavy drinker. No alcohol since December of 2000. He smoked one pack per day. No illicit drugs.  FAMILY HISTORY:  His father has diabetes and peripheral vascular disease. His mother has depression.  REVIEW OF SYSTEMS:  His left hand cramped up and his bilateral upper extremities became numb on Thursday. This has resolved. No dysuria. He has a history of hemorrhoid with scant blood on his tissue  recently.  PHYSICAL EXAMINATION:  VITAL SIGNS:  Blood pressure 103/65, heart rate 89, respiratory rate 18, O2 saturation 98% on room air.  GENERAL:  He appears uncomfortable but engages in appropriate conversation.  HEENT:  Pupils are equal, round, and reactive to light and accommodation. Extraocular movements are intact. Oropharynx is clear. Tympanic membranes are normal. There is no sinus tenderness.  LUNGS:  Decreased breath sounds at the right base but otherwise clear. There are well-healed surgical scars on the right flank.  CARDIOVASCULAR:  Regular rate and rhythm with no murmur, rub, or gallop.  ABDOMEN:  Soft, nontender. Positive bowel sounds. He is tender to palpation near the right lower rib.  EXTREMITIES:  No clubbing, cyanosis, or edema.  NEUROLOGICAL:  Cranial nerves 2 through 12 are intact. Sensation is intact to light touch in the upper extremities bilaterally.  RECTAL:  Good tone. No mass. Guaiac positive.  LABORATORY DATA:  EKG shows normal sinus rhythm with no acute ST or T wave changes.  White count 16.6, hemoglobin 15.1, hematocrit 42.2, platelets 306. Troponin I 0.47. CK 97, MB 0.8, index 0.8, creatinine 0.9.  Chest x-ray shows bibasilar atelectasis. No acute disease, COPD.  ASSESSMENT AND PLAN:  The patient is a 55 year old male with no known coronary artery disease but a long time history of smoking presents with chest  pain.  1. Chest pain. EKG is normal but his troponin I is elevated. Will admit and    evaluate with serial EKGs and enzymes. We will monitor on telemetry.    Aspirin was given in the emergency room. We will start low-dose beta    blocker and IV heparin. We will be careful with nitroglycerin as one    sublingual nitroglycerin dropped his blood pressure. Given his history of    empyema and leukocytosis, we will consider as ______. Pericarditis is a    possibility. Check a sed rate. Chest pain seems pleuritic in nature. We    will  provide morphine for pain control. 2. Hypothyroidism. Continue thyroid hormone replacement. 3. Depression. Continue home medicines. Disposition:  The patient has a card    from financial services to receive care at the [facility]. 4. Guaiac-positive stool. We will monitor hemoglobin. Will likely need    outpatient colonoscopy. DD:  10/08/99 TD:  10/08/99 Job: [id number]

## 2010-10-20 NOTE — Discharge Summary (Signed)
NAME:  Paul Jefferson, Paul Jefferson                       ACCOUNT NO.:  [id number]   MEDICAL RECORD NO.:  [id number]                   PATIENT TYPE:  IPS   LOCATION:  0300                                 FACILITY:  [facility]   PHYSICIAN:  Jeanice Lim, M.D.              DATE OF BIRTH:  1956-02-09   DATE OF ADMISSION:  10/15/2003  DATE OF DISCHARGE:  10/25/2003                                 DISCHARGE SUMMARY   IDENTIFYING DATA:  This is a 55 year old single white male, here for  detox.  Relapsed a week ago after two years of sobriety.  Alcohol level was  318.  Urine drug screen was positive for benzodiazepines.  The patient is  prescribed Klonopin.  Had been in [facility] in [facility], [facility], [facility] in  the [facility]s and [facility] in the past.  History of hypothyroidism.   MEDICATIONS:  Zoloft 100 mg q.a.m., Levothroid 0.175 mg q.d., doxepin 150 mg  q.h.s., Klonopin 2 mg q.a.m. and q.h.s.   ALLERGIES:  No known drug allergies.   PHYSICAL EXAMINATION:  Status post drainage on right in 2001, scratches  right wrist, given tetanus shot.   MENTAL STATUS EXAM:  Alert and oriented x 3.  Grooming appropriate.  Casual  attire.  Soft, hesitant speaking.  Tremulous, quite shaky.  Mood depressed,  dysphoric.  Affect remorseful.  Thought process goal directed and rational.  Cognitively intact.  No psychotic symptoms or dangerous ideation.  The  patient followed up by [facility] and [facility].   ADMISSION DIAGNOSES:   AXIS I:  1. Alcohol dependence.  2. Reported history of a diagnosis of bipolar disorder versus substance-     induced mood disorder.   AXIS II:  Dependent traits.   AXIS III:  Hypothyroidism.   AXIS IV:  Moderate (stressors including primary support, occupational  problems, housing problems and economic problems).   AXIS V:  20/55.   HOSPITAL COURSE:  The patient was admitted and ordered routine p.r.n.  medications and underwent further monitoring.   Was encouraged to participate  in individual, group and milieu therapy.  The patient was admitted for  detox, medically monitored, ordered routine p.r.n.'s.  Encouraged to  participate in individual, group and milieu therapy, especially substance  abuse treatment.  Zoloft was optimized and Seroquel used for agitation and  to restore sleep.  The patient was resumed on medications, including  Protonix and Librium p.r.n. for withdrawal symptoms.  The patient was  detoxed after initially having elevated blood pressure, pulse, temperature,  tremulous, describing palpitations, significant withdrawal symptoms with a  positive history of withdrawal seizures, drinking two fifths per day as per  patient and taking 4 mg of Klonopin a day.  The patient did gradually  tolerate stabilization of medications, nerves, medical condition and  withdrawal symptoms.  The patient had many negative complaints about the  staff, seemed to be more focused on interpersonal conflict  and reasons for  admission.  However, the patient could be redirected at times and tolerated  Librium detox protocol.  The patient participated in aftercare plan.   CONDITION ON DISCHARGE:  The patient was discharged in improved condition.  The patient still expressing negative attitude towards staff, hospital and  interventions.  Apparently, wanted to stay on Klonopin and may be having  Xanax added and minimizing the seriousness of alcohol as well as the  severity of withdrawal symptoms that he experienced during admission,  displays long history of treatment for substance abuse.  He was discharged  with no dangerous ideation, no psychotic symptoms, reported motivation to  remain abstinent and given medication education regarding safety and  risk/benefit ratio.   DISCHARGE MEDICATIONS:  1. Synthroid 175 mcg daily.  2. Sinequan 150 mg, 1 at bedtime.  3. Zoloft 100 mg, 1-1/2 q.a.m.  4. Seroquel 25 mg, 2 q.a.m., 1 p.m. and 6 p.m. and 200  mg q.h.s.   FOLLOW UP:  The patient was discharged to follow up with Emm[facility] on  Oct 26, 2003 and alcohol and drug services ([facility]).   DISCHARGE DIAGNOSES:   AXIS I:  1. Alcohol dependence.  2. Reported history of a diagnosis of bipolar disorder versus substance-     induced mood disorder.   AXIS II:  Dependent traits.   AXIS III:  Hypothyroidism.   AXIS IV:  Moderate (stressors including primary support, occupational  problems, housing problems and economic problems).   AXIS V:  Global Assessment of Functioning on discharge 55.                                               Jeanice Lim, M.D.    JEM/MEDQ  D:  11/14/2003  T:  11/14/2003  Job:  [id number]

## 2011-02-22 LAB — POCT URINALYSIS DIP (DEVICE)
Ketones, ur: NEGATIVE
Operator id: 235561
Protein, ur: NEGATIVE
Specific Gravity, Urine: 1.01

## 2011-02-22 LAB — OCCULT BLOOD X 1 CARD TO LAB, STOOL: Fecal Occult Bld: NEGATIVE

## 2011-02-23 LAB — BASIC METABOLIC PANEL
BUN: 17
BUN: 6
BUN: 9
Calcium: 8.6
Chloride: 106
Creatinine, Ser: 0.93
Creatinine, Ser: 1.03
Creatinine, Ser: 1.26
GFR calc non Af Amer: 60 — ABNORMAL LOW
GFR calc non Af Amer: >60
Glucose, Bld: 105 — ABNORMAL HIGH
Glucose, Bld: 107 — ABNORMAL HIGH
Potassium: 3.8

## 2011-02-23 LAB — CBC
HCT: 31.1 — ABNORMAL LOW
MCV: 91.7
Platelets: 272
Platelets: 316
RDW: 12.8
WBC: 11.2 — ABNORMAL HIGH
WBC: 16.1 — ABNORMAL HIGH

## 2011-02-23 LAB — TROPONIN I
Troponin I: 0.02
Troponin I: 0.03

## 2011-02-23 LAB — CK TOTAL AND CKMB (NOT AT ARMC)
Relative Index: 2.2
Relative Index: 2.7 — ABNORMAL HIGH
Total CK: 337 — ABNORMAL HIGH

## 2011-02-23 LAB — POCT CARDIAC MARKERS
CKMB, poc: 11.7
Operator id: 4708
Troponin i, poc: <0.05

## 2011-02-23 LAB — DIFFERENTIAL
Lymphocytes Relative: 17
Lymphs Abs: 2.7
Neutro Abs: 11.6 — ABNORMAL HIGH
Neutrophils Relative %: 72

## 2011-02-23 LAB — D-DIMER, QUANTITATIVE: D-Dimer, Quant: 0.83 — ABNORMAL HIGH

## 2011-02-26 LAB — BASIC METABOLIC PANEL
BUN: 14
Chloride: 104
Creatinine, Ser: 1.11
Glucose, Bld: 88
Potassium: 3.7

## 2011-02-26 LAB — CBC
HCT: 33.6 — ABNORMAL LOW
Hemoglobin: 11.7 — ABNORMAL LOW
MCHC: 35
MCV: 89.7
Platelets: 369
RBC: 3.74 — ABNORMAL LOW
RDW: 12.2
WBC: 12.4 — ABNORMAL HIGH

## 2011-02-26 LAB — POCT CARDIAC MARKERS
CKMB, poc: 1.1
Myoglobin, poc: 79.3
Operator id: 5362
Troponin i, poc: <0.05

## 2011-02-26 LAB — COMPREHENSIVE METABOLIC PANEL
ALT: 19
AST: 19
Albumin: 2.7 — ABNORMAL LOW
Alkaline Phosphatase: 102
BUN: 6
CO2: 26
Calcium: 9
Chloride: 107
Creatinine, Ser: 0.77
GFR calc Af Amer: >60
GFR calc non Af Amer: >60
Glucose, Bld: 97
Potassium: 3.6
Sodium: 139
Total Bilirubin: 0.7
Total Protein: 6.1

## 2011-02-26 LAB — DIFFERENTIAL
Basophils Absolute: 0.2 — ABNORMAL HIGH
Basophils Relative: 2 — ABNORMAL HIGH
Eosinophils Absolute: 0.5
Eosinophils Relative: 4
Lymphocytes Relative: 29
Lymphs Abs: 3.6
Monocytes Absolute: 0.8
Monocytes Relative: 7
Neutro Abs: 7.3
Neutrophils Relative %: 58

## 2011-02-26 LAB — TSH: TSH: 0.129 — ABNORMAL LOW

## 2011-02-26 LAB — RAPID URINE DRUG SCREEN, HOSP PERFORMED
Amphetamines: NOT DETECTED
Barbiturates: NOT DETECTED
Benzodiazepines: POSITIVE — AB
Cocaine: NOT DETECTED
Opiates: POSITIVE — AB
Tetrahydrocannabinol: NOT DETECTED

## 2011-02-26 LAB — T3, FREE: T3, Free: 3.6 (ref 2.3–4.2)

## 2011-02-26 LAB — ETHANOL

## 2011-02-26 LAB — T4, FREE: Free T4: 1.82 — ABNORMAL HIGH

## 2012-09-05 ENCOUNTER — Ambulatory Visit: Payer: Self-pay

## 2012-09-05 ENCOUNTER — Ambulatory Visit: Payer: Self-pay | Admitting: Family Medicine

## 2012-09-05 VITALS — BP 120/80 | HR 73 | Temp 98.5°F | Resp 20 | Ht 75.0 in | Wt 295.0 lb

## 2012-09-05 DIAGNOSIS — K5900 Constipation, unspecified: Secondary | ICD-10-CM

## 2012-09-05 DIAGNOSIS — R109 Unspecified abdominal pain: Secondary | ICD-10-CM

## 2012-09-05 DIAGNOSIS — E039 Hypothyroidism, unspecified: Secondary | ICD-10-CM

## 2012-09-05 LAB — POCT CBC
HCT, POC: 48.8 % (ref 43.5–53.7)
Hemoglobin: 15.6 g/dL (ref 14.1–18.1)
Lymph, poc: 3.8 — AB (ref 0.6–3.4)
MCHC: 32 g/dL (ref 31.8–35.4)
MCV: 99.3 fL — AB (ref 80–97)
POC Granulocyte: 8.3 — AB (ref 2–6.9)
POC LYMPH PERCENT: 29 %L (ref 10–50)
RDW, POC: 14.4 %

## 2012-09-05 MED ORDER — LEVOTHYROXINE SODIUM 200 MCG PO TABS: 200.0000 ug | ORAL_TABLET | Freq: Every day | ORAL | Status: DC

## 2012-09-05 NOTE — Patient Instructions (Addendum)
Drink more water  Try to walk more when your foot allows.  Avoid cheese, bananas  Eat more fruits and vegetables, especially green vegetables  Take a bottle of phosphosoda (ask pharmacist for it)  Take Miralax one dose twice daily until stools are loose, then decrease to 1/2 dose daily, then as needed  Consider using a fleets enema  Take the thyroid medicine faithfully  Return if worse pain or go to the emergency room   Constipation, Adult Constipation is when a person has fewer than 3 bowel movements a week; has difficulty having a bowel movement; or has stools that are dry, hard, or larger than normal. As people grow older, constipation is more common. If you try to fix constipation with medicines that make you have a bowel movement (laxatives), the problem may get worse. Long-term laxative use may cause the muscles of the colon to become weak. A low-fiber diet, not taking in enough fluids, and taking certain medicines may make constipation worse. CAUSES   Certain medicines, such as antidepressants, pain medicine, iron supplements, antacids, and water pills.   Certain diseases, such as diabetes, irritable bowel syndrome (IBS), thyroid disease, or depression.   Not drinking enough water.   Not eating enough fiber-rich foods.   Stress or travel.  Lack of physical activity or exercise.  Not going to the restroom when there is the urge to have a bowel movement.  Ignoring the urge to have a bowel movement.  Using laxatives too much. SYMPTOMS   Having fewer than 3 bowel movements a week.   Straining to have a bowel movement.   Having hard, dry, or larger than normal stools.   Feeling full or bloated.   Pain in the lower abdomen.  Not feeling relief after having a bowel movement. DIAGNOSIS  Your caregiver will take a medical history and perform a physical exam. Further testing may be done for severe constipation. Some tests may include:   A barium enema  X-ray to examine your rectum, colon, and sometimes, your small intestine.  A sigmoidoscopy to examine your lower colon.  A colonoscopy to examine your entire colon. TREATMENT  Treatment will depend on the severity of your constipation and what is causing it. Some dietary treatments include drinking more fluids and eating more fiber-rich foods. Lifestyle treatments may include regular exercise. If these diet and lifestyle recommendations do not help, your caregiver may recommend taking over-the-counter laxative medicines to help you have bowel movements. Prescription medicines may be prescribed if over-the-counter medicines do not work.  HOME CARE INSTRUCTIONS   Increase dietary fiber in your diet, such as fruits, vegetables, whole grains, and beans. Limit high-fat and processed sugars in your diet, such as [place] fries, hamburgers, cookies, candies, and soda.   A fiber supplement may be added to your diet if you cannot get enough fiber from foods.   Drink enough fluids to keep your urine clear or pale yellow.   Exercise regularly or as directed by your caregiver.   Go to the restroom when you have the urge to go. Do not hold it.  Only take medicines as directed by your caregiver. Do not take other medicines for constipation without talking to your caregiver first. SEEK IMMEDIATE MEDICAL CARE IF:   You have bright red blood in your stool.   Your constipation lasts for more than 4 days or gets worse.   You have abdominal or rectal pain.   You have thin, pencil-like stools.  You have unexplained  weight loss. MAKE SURE YOU:   Understand these instructions.  Will watch your condition.  Will get help right away if you are not doing well or get worse. Document Released: 02/17/2004 Document Revised: 08/13/2011 Document Reviewed: 04/24/2011 [facility] Patient Information 2013 [place], [place].

## 2012-09-05 NOTE — Progress Notes (Signed)
57 year old man with abdominal pain and constipation. He says his bowels have not moved for a week, and he notes that since problem. He takes muscle relaxants and tramadol for chronic back pain problems. He's been to his orthopedist couple weeks ago and was being treated for gout with colchicine. He's been out of his thyroid medicine. He does not have a history of constipation the past. However he has badly plugged up now. He says he tried to break out with his fingers a little bit, and had some rabbit poops.  Cannot eat well.  No fever.  Nonspecific generalized abdominal pains. Hard to pass gas.  All he has taken is a stool softener.  Objective: Obese male who does not feel well. Chest is clear. Heart regular without murmurs. Abdomen has fairly normal bowel sounds. Tympanitic in the upper abdomen on percussion. Soft without organomegaly or masses. He is rotund. Skin is warm and dry. He does have a splint on his ankle where the gout bothered him.  Results for orders placed in visit on 09/05/12  POCT CBC      Result Value Range   WBC 13.2 (*) 4.6 - 10.2 K/uL   Lymph, poc 3.8 (*) 0.6 - 3.4   POC LYMPH PERCENT 29.0  10 - 50 %L   MID (cbc) 1.0 (*) 0 - 0.9   POC MID % 7.9  0 - 12 %M   POC Granulocyte 8.3 (*) 2 - 6.9   Granulocyte percent 63.1  37 - 80 %G   RBC 4.91  4.69 - 6.13 M/uL   Hemoglobin 15.6  14.1 - 18.1 g/dL   HCT, POC 44.0  10.2 - 53.7 %   MCV 99.3 (*) 80 - 97 fL   MCH, POC 31.8 (*) 27 - 31.2 pg   MCHC 32.0  31.8 - 35.4 g/dL   RDW, POC 72.5     Platelet Count, POC 196  142 - 424 K/uL   MPV 10.2  0 - 99.8 fL   [facility] reading (PRIMARY) by  Dr. Alwyn Ren Nonspecific abdomen. Lots of gas in the transverse colon. Lots of stool in the low abdomen.  Assessment: Abdominal pain constipation Hypothyroidism Recent attack of gout.  Plan: Gave him the instructions below. Stress if he gets abruptly worse he is going to the emergency room. Actually as I went through various laxatives and things  he started telling me more things. He tried. He would tell me that he are to try that, he is arty tried this, and initially when I asked him he only told me that he had taken some stool softeners. This progressed revelation of his history made it difficult.   Drink more water  Try to walk more when your foot allows.  Avoid cheese, bananas  Eat more fruits and vegetables, especially green vegetables  Take a bottle of phosphosoda (ask pharmacist for it)  Take Miralax one dose twice daily until stools are loose, then decrease to 1/2 dose daily, then as needed  Consider using a fleets enema  Take the thyroid medicine faithfully  Return if worse pain or go to the emergency room

## 2012-09-06 LAB — TSH: TSH: 692.42 u[IU]/mL — ABNORMAL HIGH (ref 0.350–4.500)

## 2012-09-12 ENCOUNTER — Telehealth: Payer: Self-pay | Admitting: Radiology

## 2012-09-12 ENCOUNTER — Other Ambulatory Visit: Payer: Self-pay | Admitting: Radiology

## 2012-09-12 ENCOUNTER — Encounter: Payer: Self-pay | Admitting: Radiology

## 2012-09-12 DIAGNOSIS — E039 Hypothyroidism, unspecified: Secondary | ICD-10-CM

## 2012-09-12 NOTE — Telephone Encounter (Signed)
Spoke with patient and went over results-he understood and will return for repeat blood work. He does take his medication daily x 6-7 years. Orders will be placed for lab visit only.  Notes Recorded by Watt Climes on 09/12/2012 at 8:15 AM Left message on machine to call back. Will send U[facility] letter. Notes Recorded by Johnnette Litter, CMA on 09/11/2012 at 5:53 PM Left message on machine to call back Notes Recorded by Lucia Gaskins, CMA on 09/09/2012 at 3:37 PM Left message to return call Notes Recorded by Peyton Najjar, MD on 09/08/2012 at 10:09 AM Call patient:  His thyroid test shows an extremely underactive thyroid gland. Has he been on his medicine faithfully?   We need to be sure the test was correct (this is the most underactive value I have seen for a thyroid). I would like him to come in and get lab only repeat of tests this week: Full thyroid panel plus TSH.  If the tests prove to be correct and he has been taking his medicine faithfully I will need to refer him to a specialist.

## 2012-09-18 ENCOUNTER — Other Ambulatory Visit: Payer: Self-pay

## 2012-09-18 DIAGNOSIS — E039 Hypothyroidism, unspecified: Secondary | ICD-10-CM

## 2012-09-18 LAB — THYROID PANEL WITH TSH
Free Thyroxine Index: 3.1 (ref 1.0–3.9)
T4, Total: 10.1 ug/dL (ref 5.0–12.5)
TSH: 43.026 u[IU]/mL — ABNORMAL HIGH (ref 0.350–4.500)

## 2012-09-24 ENCOUNTER — Telehealth: Payer: Self-pay

## 2012-09-24 NOTE — Telephone Encounter (Signed)
Patient was notified of lab findings.

## 2012-09-24 NOTE — Telephone Encounter (Signed)
Pt is calling in regards to labs. Call back number is [phone]

## 2012-09-25 MED ORDER — LEVOTHYROXINE SODIUM 125 MCG PO TABS: ORAL_TABLET | ORAL | Status: DC

## 2012-09-25 NOTE — Telephone Encounter (Signed)
Patient called wanted to know why thyroid meds has not been called in yet. Pt was told yesterday they would be called in. [employer]. Please let pt know when done. [phone]

## 2012-09-25 NOTE — Telephone Encounter (Signed)
Per lab results note from Dr. Alwyn Ren- Synthroid increased to 250 mcg- pt made aware

## 2013-03-06 ENCOUNTER — Ambulatory Visit: Payer: Self-pay | Admitting: Family Medicine

## 2013-03-06 ENCOUNTER — Ambulatory Visit: Payer: Self-pay

## 2013-03-06 VITALS — BP 118/70 | HR 100 | Temp 98.6°F | Resp 20 | Ht 74.0 in | Wt 272.2 lb

## 2013-03-06 DIAGNOSIS — E039 Hypothyroidism, unspecified: Secondary | ICD-10-CM

## 2013-03-06 DIAGNOSIS — M25579 Pain in unspecified ankle and joints of unspecified foot: Secondary | ICD-10-CM

## 2013-03-06 DIAGNOSIS — Z23 Encounter for immunization: Secondary | ICD-10-CM

## 2013-03-06 DIAGNOSIS — S93409A Sprain of unspecified ligament of unspecified ankle, initial encounter: Secondary | ICD-10-CM

## 2013-03-06 DIAGNOSIS — S93401A Sprain of unspecified ligament of right ankle, initial encounter: Secondary | ICD-10-CM

## 2013-03-06 DIAGNOSIS — M25571 Pain in right ankle and joints of right foot: Secondary | ICD-10-CM

## 2013-03-06 DIAGNOSIS — M109 Gout, unspecified: Secondary | ICD-10-CM

## 2013-03-06 MED ORDER — HYDROCODONE-ACETAMINOPHEN 5-325 MG PO TABS: 1.0000 | ORAL_TABLET | Freq: Four times a day (QID) | ORAL | Status: DC | PRN

## 2013-03-06 MED ORDER — LEVOTHYROXINE SODIUM 125 MCG PO TABS: ORAL_TABLET | ORAL | Status: DC

## 2013-03-06 MED ORDER — ALLOPURINOL 100 MG PO TABS: 100.0000 mg | ORAL_TABLET | Freq: Every day | ORAL | Status: DC

## 2013-03-06 MED ORDER — DICLOFENAC SODIUM 75 MG PO TBEC: 75.0000 mg | DELAYED_RELEASE_TABLET | Freq: Two times a day (BID) | ORAL | Status: DC

## 2013-03-06 MED ORDER — COLCHICINE 0.6 MG PO TABS: 0.6000 mg | ORAL_TABLET | Freq: Every day | ORAL | Status: DC

## 2013-03-06 NOTE — Progress Notes (Addendum)
Subjective: Patient stumbled on Sunday night and felt a pop in the lateral aspect of right ankle. Same morning it was hurting him, and is continued to hurt intensely. He does have a history of gout. He has taken colchicine for gout, and apparently also has been on allopurinol at one point. The colchicine is very expensive. He needs his thyroid checked today also.  Objective: Pleasant man in moderate discomfort. Cannot move his right foot without hurting him a good deal. Stat tenderness laterally over the distal fibula and in the soft tissues surrounding that and below it. When he moves his toes it hurts lower aspect of his foot. He is tender along there also. Pulses are diminished in that right foot, but capillary refill appears to be very good .  Assessment: Right foot injury and pain Hypothyroidism  Plan: X-ray right foot Uric acid level TSH  [facility] reading (PRIMARY) by  Dr. Alwyn Ren Normal ankle.

## 2013-03-06 NOTE — Patient Instructions (Signed)
Wear your boot at home to support the ankle  Allopurinol one daily  Continue the colchicine when you have attacks of gout  Take the diclofenac one twice daily for pain and inflammation and ankle  Use the hydrocodone every 4 hours when needed for severe pain only  Continue thyroid  Return if ankle not improving

## 2013-03-07 LAB — TSH: TSH: 0.049 u[IU]/mL — ABNORMAL LOW (ref 0.350–4.500)

## 2013-03-15 ENCOUNTER — Other Ambulatory Visit: Payer: Self-pay

## 2013-03-15 ENCOUNTER — Telehealth: Payer: Self-pay | Admitting: *Deleted

## 2013-03-15 MED ORDER — LEVOTHYROXINE SODIUM 100 MCG PO TABS: 100.0000 ug | ORAL_TABLET | Freq: Every day | ORAL | Status: DC

## 2013-03-15 NOTE — Telephone Encounter (Signed)
Pt would like a refill on pain medication, because his ankle is not improving.  He tried taking the diclofenac but it gives him the tremors.  Can he try something else?

## 2013-03-16 ENCOUNTER — Telehealth: Payer: Self-pay | Admitting: Family Medicine

## 2013-03-16 NOTE — Telephone Encounter (Signed)
Called patient to advise  °

## 2013-03-16 NOTE — Telephone Encounter (Signed)
Patient want refill of pain medicine Amy L already spoke with patient about this plus want a refill of his thyroid medicine

## 2013-03-16 NOTE — Telephone Encounter (Signed)
Would recommend pt RTC for further evaluation, would have expected improvement within 10 days

## 2013-03-19 ENCOUNTER — Ambulatory Visit: Payer: Self-pay | Admitting: Family Medicine

## 2013-03-19 VITALS — BP 122/74 | HR 94 | Temp 97.3°F | Resp 20 | Ht 74.5 in | Wt 272.8 lb

## 2013-03-19 DIAGNOSIS — Z5189 Encounter for other specified aftercare: Secondary | ICD-10-CM

## 2013-03-19 DIAGNOSIS — M25571 Pain in right ankle and joints of right foot: Secondary | ICD-10-CM

## 2013-03-19 DIAGNOSIS — S99921D Unspecified injury of right foot, subsequent encounter: Secondary | ICD-10-CM

## 2013-03-19 DIAGNOSIS — I739 Peripheral vascular disease, unspecified: Secondary | ICD-10-CM

## 2013-03-19 MED ORDER — METHOCARBAMOL 500 MG PO TABS: 500.0000 mg | ORAL_TABLET | Freq: Three times a day (TID) | ORAL | Status: DC

## 2013-03-19 MED ORDER — HYDROCODONE-ACETAMINOPHEN 5-325 MG PO TABS: 1.0000 | ORAL_TABLET | Freq: Four times a day (QID) | ORAL | Status: DC | PRN

## 2013-03-19 NOTE — Patient Instructions (Signed)
RICE:  R: Rest is achieved by limiting weightbearing. Use crutches until you are able to walk with a normal gait. I:   Ice or cold water immersion for 15 to 20 minutes every two to three hours for the first 48 hours or until swelling is improved, whichever comes first. C: Compression with an elastic bandage to minimize swelling should be applied early. E: Elevation - The injured ankle should be kept elevated above the level of the heart to further alleviate swelling.  Nonsteroidal antiinflammatory drugs (NSAIDs) can be used; no particular NSAID has been shown to be superior so you can using whatever you have at home - ibuprofen, aleve, motrin, advil, etc.    Exercises including pointing and flexing the foot and doing foot circles should be started early, once acute pain and swelling subside, to maintain range of motion. The intensity of rehabilitation is increased gradually.  Ankle splints or braces can limit extremes of joint motion and allow early weightbearing while protecting against reinjury.   Ankle Exercises for Rehabilitation Following ankle injuries, it is as important to follow your caregivers instructions for regaining full use of your ankle as it was to follow the initial treatment plan following the injury. The following are some suggestions for exercises and treatment, which can be done to help you regain full use of your ankle as soon as possible.  Follow all instructions regarding physical therapy.  Before exercising, it may be helpful to use heat on the muscles or joint being exercised. This loosens up the muscles and tendons (cord like structure) and decreases chances of injury during your exercises. If this is not possible just begin your exercises slowly to gradually warm up.  Stand on your toes several times per dayto strengthen the calf muscles. These are the muscles in the back of your leg between the knee and the heel. The cord you can feel just above the heel is the  Achilles tendon. Rise up on your toes several times repeating this three to four times per day. Do not exercise to the point of pain. If pain starts to develop, decrease the exercise until you are comfortable again.  Do range of motion exercises. This means moving the ankle in all directions. Practice writing the alphabet with your toes in the air. Do not increase beyond a range that is comfortable.  Increase the strength of the muscles in the front of your leg by raising your toes and foot straight up in the air. Repeat this exercise as you did the calf exercise with the same warnings. This also help to stretch your muscles.  Stretch your calf muscles also by leaning against a wall with your hands in front of you. Put your feet a few feet from the wall and bend your knees until you feel the muscles in your calves become tight.  After exercising it may be helpful to put ice on the ankle to prevent swelling and improve rehabilitation. This may be done for 15 to 20 minutes following your exercises. If exercising is being done in the work place, this may not always be possible.  Taping an ankle injury may be helpful to give added support following an injury. It also may help prevent re-injury. This may be true if you are in training or in a conditioning program. You and your caregiver can decide on the best course of action to follow. Document Released: 05/18/2000 Document Revised: 08/13/2011 Document Reviewed: 05/15/2008 [facility] Patient Information 2014 [place], [place].

## 2013-03-25 NOTE — Progress Notes (Signed)
Subjective:    Patient ID: Paul Jefferson, male    DOB: [birth year], 57 y.o.   MRN: [id number]  Chief Complaint  Patient presents with  . Medication Refill  . Ankle Pain    right ankle   Ankle Pain  Pertinent negatives include no numbness.   Paul Jefferson is a 57 y.o. male with a history of gout who presents to [facility] complaining of right ankle pain of sudden onset 2 weeks prev when he tripped over it and heard a pop. Patient reports that he has had to compensate for some ongoing weakness to the left ankle after Lt ankle surgery for an accident that occurred at work several years ago (performed by Dr. Fonnie Jarvis), and so tends to rely to heavily on his Rt ankle. Patient was seen here by Dr. Alwyn Ren 2 weeks ago w/ negative Rt ankle xray so diagnosed w/ ankle sprain. Patient states that he was started on a course of Diclofenac, but that he did not like its effects and has stopped taking it. Patient was also started on a course of allopurinol 2 weeks ago and reports that he is still currently taking it and has had no prob w/ gout. Patient reports that he has run out of his prescription for hydrocodone and would like a refill. He denies any recent changes to his left ankle. He reports associated swelling to the right ankle. Patient reports applying ice to the area with mild, temporary relief.    Past Medical History  Diagnosis Date  . Thyroid disease   . Neuromuscular disorder   . Depression    Current Outpatient Prescriptions on File Prior to Visit  Medication Sig Dispense Refill  . allopurinol (ZYLOPRIM) 100 MG tablet Take 1 tablet (100 mg total) by mouth daily.  30 tablet  6  . colchicine 0.6 MG tablet Take 1 tablet (0.6 mg total) by mouth daily.  30 tablet  5  . levothyroxine (SYNTHROID, LEVOTHROID) 100 MCG tablet Take 1 tablet (100 mcg total) by mouth daily.  30 tablet  2  . diclofenac (VOLTAREN) 75 MG EC tablet Take 1 tablet (75 mg total) by mouth 2 (two) times  daily.  30 tablet  0  . levothyroxine (SYNTHROID) 125 MCG tablet Take 2 tablets daily- total dose of 250 mcg  60 tablet  5  . traMADol (ULTRAM) 50 MG tablet Take 50 mg by mouth every 6 (six) hours as needed for pain.       No current facility-administered medications on file prior to visit.   Allergies  Allergen Reactions  . Voltaren [Diclofenac Sodium] Other (See Comments)    Per patients states this medication caused him to feel nervous, dizziness, and light headed and he does not want to take it again.   Review of Systems  Constitutional: Negative for fever, chills, diaphoresis, activity change and appetite change.  Musculoskeletal: Positive for arthralgias, gait problem, joint swelling and myalgias.  Skin: Positive for color change ( ecchymosis). Negative for rash and wound.  Neurological: Positive for weakness ( to left ankle, normal for him). Negative for numbness.  Hematological: Negative for adenopathy. Does not bruise/bleed easily.     BP 122/74  Pulse 94  Temp(Src) 97.3 F (36.3 C) (Oral)  Resp 20  Ht 6' 2.5" (1.892 m)  Wt 272 lb 12.8 oz (123.741 kg)  BMI 34.57 kg/m2  SpO2 96%  Objective:   Physical Exam  Nursing note and vitals reviewed. Constitutional: He is oriented  to person, place, and time. He appears well-developed and well-nourished. No distress.  HENT:  Head: Normocephalic and atraumatic.  Eyes: Conjunctivae are normal.  Neck: Normal range of motion. Neck supple.  Cardiovascular: Intact distal pulses.   Pulmonary/Chest: Effort normal. No respiratory distress.  Abdominal: He exhibits no distension.  Musculoskeletal: Normal range of motion. He exhibits edema and tenderness.  Right foot is cold with decreased capillary refill of 6-7 seconds. 1+ DP pulse bilaterally. Posterior tibial pulse on the right is not palpable. Unable to move the right toes secondary to pain. No pain to the lateral malleolus. Pain along distal fibula. Trace amount of edema to the right  foot. Pain along the 5th metatarsal head. Pain along the AITF. Negative squeeze and Thompson's test. Negative CF ligament tenderness.   Neurological: He is alert and oriented to person, place, and time.  Skin: Skin is warm and dry.  Ecchymosis over mid and lateral right foot.   Psychiatric: He has a normal mood and affect. His behavior is normal.      Assessment & Plan:   Right foot injury, subsequent encounter - Plan: Ambulatory referral to Orthopedic Surgery   Right ankle pain - Plan: HYDROcodone-acetaminophen (NORCO) 5-325 MG per tablet, Ambulatory referral to Orthopedic Surgery - recommend to pt that he needs a foot xray since now he is having more pain over his mid-lateral foot - an area that would not be well visualized on his prior ankle xray - however, pt declines due to cost.  He is still having severe pain and disability with huge loss of ROM and function 2 wks after sprain so he needs ortho eval to which pt is aggreable. Refilled pain medicines for pt x 1 only but no further refills w/o further w/u as to cause of foot pain. Discussed treatment plan with patient at bedside and patient verbalized agreement.   Peripheral vascular disease, unspecified   Meds ordered this encounter  Medications  . HYDROcodone-acetaminophen (NORCO) 5-325 MG per tablet    Sig: Take 1 tablet by mouth every 6 (six) hours as needed for pain.    Dispense:  30 tablet    Refill:  0  . methocarbamol (ROBAXIN) 500 MG tablet    Sig: Take 1 tablet (500 mg total) by mouth 3 (three) times daily.    Dispense:  40 tablet    Refill:  0    I personally performed the services described in this documentation, which was scribed in my presence. The recorded information has been reviewed and considered, and addended by me as needed.  Norberto Sorenson, MD MPH

## 2013-04-02 ENCOUNTER — Telehealth: Payer: Self-pay

## 2013-04-02 NOTE — Telephone Encounter (Signed)
Dr.Hopper,  Pt states that he dropped off his labs recently and wanted to know if you have had time to review them yet.    Best# [phone]

## 2013-04-03 NOTE — Telephone Encounter (Signed)
Did you review?

## 2013-04-03 NOTE — Telephone Encounter (Signed)
Patient is calling in reference to some lab results/ppw that he dropped off for Dr. Alwyn Ren to review.   [phone]

## 2013-04-04 ENCOUNTER — Telehealth: Payer: Self-pay

## 2013-04-04 NOTE — Telephone Encounter (Signed)
Patient called requesting that Dr. Alwyn Ren review patient's lab work and complete a letter. Patient's phone number [phone].

## 2013-04-06 ENCOUNTER — Other Ambulatory Visit: Payer: Self-pay | Admitting: Family Medicine

## 2013-04-06 DIAGNOSIS — M25571 Pain in right ankle and joints of right foot: Secondary | ICD-10-CM

## 2013-04-06 MED ORDER — HYDROCODONE-ACETAMINOPHEN 5-325 MG PO TABS: 1.0000 | ORAL_TABLET | Freq: Four times a day (QID) | ORAL | Status: DC | PRN

## 2013-04-06 NOTE — Progress Notes (Signed)
See telephone note.

## 2013-04-06 NOTE — Telephone Encounter (Signed)
I tried to call.  Left message on machine.  However I would like him called back again to say the below:  1:  His note asked about lovastatin for his lipids.  Only the triglycerides were high on his labs he brought by. That is not a treatment of choice for just high triglycerides since his cholesterol was good. I left a message that he should come back by some time to discuss his lipids and decide on another medication for him. He has a little liver enzyme elevation also, and that would need to be monitored.  #2   he wants more pain pills. He was supposed to have gone to see an orthopedist. I will give him a few pain pills and leave the prescription at the desk, for 20 more of the Norco 5/325. However I will not prescribe anymore after this.

## 2013-04-07 ENCOUNTER — Telehealth: Payer: Self-pay

## 2013-04-07 NOTE — Telephone Encounter (Signed)
Noted  

## 2013-04-07 NOTE — Telephone Encounter (Signed)
Lupita Leash will resend the Referral to [facility], he has settled the [employer], and is self [facility] will no longer see him. He will pick up the records from [facility] and have them available for the appointment at [facility]. Patient aware this is the last Rx Dr Alwyn Ren will provide, to you FYI

## 2013-04-07 NOTE — Telephone Encounter (Signed)
Called him, see previous phone message from Dr Alwyn Ren.

## 2013-04-07 NOTE — Telephone Encounter (Signed)
PT WOULD LIKE TO SPEAK WITH A NURSE, DIDN'T WANT TO GO INTO DETAILS WITH ME. PLEASE CALL [phone]

## 2013-04-13 ENCOUNTER — Telehealth: Payer: Self-pay | Admitting: Radiology

## 2013-04-13 NOTE — Telephone Encounter (Signed)
Can you please check on Ortho referral?

## 2013-04-13 NOTE — Telephone Encounter (Signed)
Patient needs meds to control his triglyerides [facility] on [address]   [phone]

## 2013-04-13 NOTE — Telephone Encounter (Signed)
Per Dr Alwyn Ren, advised patient medication not indicated. He understands he needs to have LFT'S drawn, he wants lab only for this. Can this be lab only encounter? Pended please advise.  Also have asked Lupita Leash to check on the referral , as he has not heard from this yet.

## 2013-04-15 NOTE — Telephone Encounter (Signed)
Discussed with Paul Jefferson has declined the referral, he has to contact [facility]. Need to explain to patient, 2 groups [facility]  and [place], neither has accepted his referral. He will be advised to contact [facility], Dr Lajoyce Corners will take care of him, but he has to call them, he has a balance there.

## 2013-04-16 NOTE — Telephone Encounter (Signed)
Lab only visit okay.

## 2013-04-16 NOTE — Telephone Encounter (Signed)
Thanks patient aware

## 2013-04-20 NOTE — Telephone Encounter (Signed)
Left another message for him, I am quite sure he knows the situation.

## 2014-02-11 ENCOUNTER — Emergency Department (HOSPITAL_COMMUNITY)
Admission: EM | Admit: 2014-02-11 | Discharge: 2014-02-11 | Disposition: A | Discharge status: Home / Self Care | Payer: Self-pay | Attending: Emergency Medicine | Admitting: Emergency Medicine

## 2014-02-11 ENCOUNTER — Encounter (HOSPITAL_COMMUNITY): Payer: Self-pay | Admitting: Emergency Medicine

## 2014-02-11 DIAGNOSIS — E079 Disorder of thyroid, unspecified: Secondary | ICD-10-CM | POA: Insufficient documentation

## 2014-02-11 DIAGNOSIS — Z8659 Personal history of other mental and behavioral disorders: Secondary | ICD-10-CM | POA: Insufficient documentation

## 2014-02-11 DIAGNOSIS — M25529 Pain in unspecified elbow: Secondary | ICD-10-CM | POA: Insufficient documentation

## 2014-02-11 DIAGNOSIS — M7020 Olecranon bursitis, unspecified elbow: Secondary | ICD-10-CM | POA: Insufficient documentation

## 2014-02-11 DIAGNOSIS — Z79899 Other long term (current) drug therapy: Secondary | ICD-10-CM | POA: Insufficient documentation

## 2014-02-11 DIAGNOSIS — F17200 Nicotine dependence, unspecified, uncomplicated: Secondary | ICD-10-CM | POA: Insufficient documentation

## 2014-02-11 DIAGNOSIS — Z8669 Personal history of other diseases of the nervous system and sense organs: Secondary | ICD-10-CM | POA: Insufficient documentation

## 2014-02-11 DIAGNOSIS — M7022 Olecranon bursitis, left elbow: Secondary | ICD-10-CM

## 2014-02-11 DIAGNOSIS — G8929 Other chronic pain: Secondary | ICD-10-CM | POA: Insufficient documentation

## 2014-02-11 DIAGNOSIS — M549 Dorsalgia, unspecified: Secondary | ICD-10-CM | POA: Insufficient documentation

## 2014-02-11 MED ORDER — OXYCODONE-ACETAMINOPHEN 5-325 MG PO TABS: 1.0000 | ORAL_TABLET | ORAL | Status: DC | PRN

## 2014-02-11 MED ORDER — OXYCODONE-ACETAMINOPHEN 5-325 MG PO TABS: 2.0000 | ORAL_TABLET | Freq: Once | ORAL | Status: DC

## 2014-02-11 MED ORDER — DEXAMETHASONE SODIUM PHOSPHATE 10 MG/ML IJ SOLN: 10.0000 mg | Freq: Once | INTRAMUSCULAR | Status: DC

## 2014-02-11 MED ORDER — KETOROLAC TROMETHAMINE 60 MG/2ML IM SOLN
60.0000 mg | Freq: Once | INTRAMUSCULAR | Status: DC
  Filled 2014-02-11: qty 2

## 2014-02-11 MED ORDER — MELOXICAM 15 MG PO TABS: 15.0000 mg | ORAL_TABLET | Freq: Every day | ORAL | Status: DC

## 2014-02-11 NOTE — Discharge Instructions (Signed)
Olecranon Bursitis Bursitis is swelling and soreness (inflammation) of a fluid-filled sac (bursa) that covers and protects a joint. Olecranon bursitis occurs over the elbow.  CAUSES Bursitis can be caused by injury, overuse of the joint, arthritis, or infection.  SYMPTOMS   Tenderness, swelling, warmth, or redness over the elbow.  Elbow pain with movement. This is greater with bending the elbow.  Squeaking sound when the bursa is rubbed or moved.  Increasing size of the bursa without pain or discomfort.  Fever with increasing pain and swelling if the bursa becomes infected. HOME CARE INSTRUCTIONS   Put ice on the affected area.  Put ice in a plastic bag.  Place a towel between your skin and the bag.  Leave the ice on for 15-20 minutes each hour while awake. Do this for the first 2 days.  When resting, elevate your elbow above the level of your heart. This helps reduce swelling.  Continue to put the joint through a full range of motion 4 times per day. Rest the injured joint at other times. When the pain lessens, begin normal slow movements and usual activities.  Only take over-the-counter or prescription medicines for pain, discomfort, or fever as directed by your caregiver.  Reduce your intake of milk and related dairy products (cheese, yogurt). They may make your condition worse. SEEK IMMEDIATE MEDICAL CARE IF:   Your pain increases even during treatment.  You have a fever.  You have heat and inflammation over the bursa and elbow.  You have a red line that goes up your arm.  You have pain with movement of your elbow. MAKE SURE YOU:   Understand these instructions.  Will watch your condition.  Will get help right away if you are not doing well or get worse. Document Released: 06/20/2006 Document Revised: 08/13/2011 Document Reviewed: 05/06/2007 [employer] Patient Information 2015 [employer]. This information is not intended to replace advice given to you by your  health care provider. Make sure you discuss any questions you have with your health care provider.  

## 2014-02-11 NOTE — ED Notes (Signed)
Pt c/o right elbow pain with some redness and swelling noted; pt sts some foot pain; pt sts hx of gout

## 2014-02-11 NOTE — ED Provider Notes (Signed)
CSN: [id number]     Arrival date & time 02/11/14  1326 History  This chart was scribed for non-physician practitioner Arthor Captain working with Candyce Churn III, * by Carl Best, ED Scribe. This patient was seen in room TR07C/TR07C and the patient's care was started at 2:59 PM.     Chief Complaint  Patient presents with  . Elbow Pain  . Foot Pain    HPI HPI Comments: Isador Castille is a 58 y.o. male with a history of gout who presents to the Emergency Department complaining of constant right elbow pain with associated swelling that started last night.  He is also complaining of constant bilateral foot pain, ankle pain, back pain, and shoulder pain.    Past Medical History  Diagnosis Date  . Thyroid disease   . Neuromuscular disorder   . Depression    Past Surgical History  Procedure Laterality Date  . Cholecystectomy     History reviewed. No pertinent family history. History  Substance Use Topics  . Smoking status: Current Every Day Smoker -- 0.50 packs/day    Types: Cigarettes  . Smokeless tobacco: Not on file  . Alcohol Use: No    Review of Systems  Musculoskeletal: Positive for arthralgias, back pain and joint swelling.      Allergies  Voltaren  Home Medications   Prior to Admission medications   Medication Sig Start Date End Date Taking? Authorizing Provider  allopurinol (ZYLOPRIM) 100 MG tablet Take 1 tablet (100 mg total) by mouth daily. 03/06/13  Yes Peyton Najjar, MD  colchicine 0.6 MG tablet Take 0.6 mg by mouth daily as needed (for gout flare ups). 03/06/13  Yes Peyton Najjar, MD  levothyroxine (SYNTHROID, LEVOTHROID) 125 MCG tablet Take by mouth daily before breakfast. 03/06/13  Yes Peyton Najjar, MD   BP 150/71  Pulse 108  Temp(Src) 98 F (36.7 C) (Oral)  Resp 20  Ht  (1.905 m)  Wt 265 lb (120.203 kg)  BMI 33.12 kg/m2  SpO2 96%  Physical Exam  Nursing note and vitals reviewed. Constitutional: He is oriented to person, place,  and time. He appears well-developed and well-nourished.  HENT:  Head: Normocephalic and atraumatic.  Eyes: EOM are normal.  Neck: Normal range of motion.  Cardiovascular: Normal rate.   Pulmonary/Chest: Effort normal.  Musculoskeletal: Normal range of motion.  Neurological: He is alert and oriented to person, place, and time.  Skin: Skin is warm and dry.  Psychiatric: He has a normal mood and affect. His behavior is normal.      ED Course  Procedures (including critical care time)  DIAGNOSTIC STUDIES: Oxygen Saturation is 96% on room air, adequate by my interpretation.    COORDINATION OF CARE: 3:00 PM- Discussed a clinical suspicion of olecranon bursitis with the patient.  Discussed discharging the patient with steroids and pain medication and the patient agreed to the treatment plan.   Labs Review Labs Reviewed - No data to display  Imaging Review No results found.   EKG Interpretation None      MDM   Final diagnoses:  Olecranon bursitis, left  Chronic pain    Patient with olecranon bursitis of the R elbow. Able to move although painful. No apparent signs of infeciton. Chronic pain. Will d/c withe pain meds. F/u with pcp  I personally performed the services described in this documentation, which was scribed in my presence. The recorded information has been reviewed and is accurate.  {Add scribe attestation  stateme  Arthor Captain, PA-C 02/14/14 1943

## 2014-02-15 NOTE — ED Provider Notes (Signed)
Medical screening examination/treatment/procedure(s) were performed by non-physician practitioner and as supervising physician I was immediately available for consultation/collaboration.   EKG Interpretation None        Jarissa Sheriff David Odas Ozer III, MD 02/15/14 1633 

## 2014-04-13 ENCOUNTER — Encounter (HOSPITAL_COMMUNITY): Payer: Self-pay | Admitting: Emergency Medicine

## 2014-04-13 ENCOUNTER — Emergency Department (HOSPITAL_COMMUNITY): Payer: Self-pay

## 2014-04-13 ENCOUNTER — Inpatient Hospital Stay (HOSPITAL_COMMUNITY)
Admission: EM | Admit: 2014-04-13 | Discharge: 2014-04-16 | DRG: 896 | Disposition: A | Discharge status: Home / Self Care | Payer: Self-pay | Attending: Family Medicine | Admitting: Family Medicine

## 2014-04-13 DIAGNOSIS — E871 Hypo-osmolality and hyponatremia: Secondary | ICD-10-CM | POA: Diagnosis present

## 2014-04-13 DIAGNOSIS — F1010 Alcohol abuse, uncomplicated: Secondary | ICD-10-CM

## 2014-04-13 DIAGNOSIS — R079 Chest pain, unspecified: Secondary | ICD-10-CM

## 2014-04-13 DIAGNOSIS — E785 Hyperlipidemia, unspecified: Secondary | ICD-10-CM | POA: Diagnosis present

## 2014-04-13 DIAGNOSIS — F419 Anxiety disorder, unspecified: Secondary | ICD-10-CM | POA: Diagnosis present

## 2014-04-13 DIAGNOSIS — F10239 Alcohol dependence with withdrawal, unspecified: Principal | ICD-10-CM | POA: Diagnosis present

## 2014-04-13 DIAGNOSIS — E079 Disorder of thyroid, unspecified: Secondary | ICD-10-CM | POA: Diagnosis present

## 2014-04-13 DIAGNOSIS — E8729 Other acidosis: Secondary | ICD-10-CM | POA: Diagnosis present

## 2014-04-13 DIAGNOSIS — J9811 Atelectasis: Secondary | ICD-10-CM | POA: Diagnosis present

## 2014-04-13 DIAGNOSIS — M109 Gout, unspecified: Secondary | ICD-10-CM | POA: Diagnosis present

## 2014-04-13 DIAGNOSIS — Z79899 Other long term (current) drug therapy: Secondary | ICD-10-CM

## 2014-04-13 DIAGNOSIS — F17210 Nicotine dependence, cigarettes, uncomplicated: Secondary | ICD-10-CM | POA: Diagnosis present

## 2014-04-13 DIAGNOSIS — E872 Acidosis: Secondary | ICD-10-CM | POA: Diagnosis present

## 2014-04-13 DIAGNOSIS — E669 Obesity, unspecified: Secondary | ICD-10-CM | POA: Diagnosis present

## 2014-04-13 DIAGNOSIS — Z79891 Long term (current) use of opiate analgesic: Secondary | ICD-10-CM

## 2014-04-13 DIAGNOSIS — Z6834 Body mass index (BMI) 34.0-34.9, adult: Secondary | ICD-10-CM

## 2014-04-13 DIAGNOSIS — F10939 Alcohol use, unspecified with withdrawal, unspecified: Secondary | ICD-10-CM | POA: Diagnosis present

## 2014-04-13 DIAGNOSIS — F329 Major depressive disorder, single episode, unspecified: Secondary | ICD-10-CM | POA: Diagnosis present

## 2014-04-13 DIAGNOSIS — J9601 Acute respiratory failure with hypoxia: Secondary | ICD-10-CM | POA: Diagnosis present

## 2014-04-13 DIAGNOSIS — F064 Anxiety disorder due to known physiological condition: Secondary | ICD-10-CM | POA: Diagnosis present

## 2014-04-13 DIAGNOSIS — R0902 Hypoxemia: Secondary | ICD-10-CM

## 2014-04-13 DIAGNOSIS — R072 Precordial pain: Secondary | ICD-10-CM | POA: Diagnosis present

## 2014-04-13 DIAGNOSIS — R911 Solitary pulmonary nodule: Secondary | ICD-10-CM | POA: Diagnosis present

## 2014-04-13 DIAGNOSIS — Z888 Allergy status to other drugs, medicaments and biological substances status: Secondary | ICD-10-CM

## 2014-04-13 DIAGNOSIS — Z23 Encounter for immunization: Secondary | ICD-10-CM

## 2014-04-13 HISTORY — DX: Gout, unspecified: M10.9

## 2014-04-13 LAB — I-STAT TROPONIN, ED
TROPONIN I, POC: 0.02 ng/mL (ref 0.00–0.08)
Troponin i, poc: 0 ng/mL (ref 0.00–0.08)

## 2014-04-13 LAB — RAPID URINE DRUG SCREEN, HOSP PERFORMED
Amphetamines: NOT DETECTED
BENZODIAZEPINES: NOT DETECTED
Barbiturates: NOT DETECTED
Cocaine: NOT DETECTED
Opiates: NOT DETECTED
Tetrahydrocannabinol: NOT DETECTED

## 2014-04-13 LAB — BASIC METABOLIC PANEL
ANION GAP: 21 — AB (ref 5–15)
BUN: 12 mg/dL (ref 6–23)
CHLORIDE: 92 meq/L — AB (ref 96–112)
CO2: 21 mEq/L (ref 19–32)
Calcium: 9.8 mg/dL (ref 8.4–10.5)
Creatinine, Ser: 1.05 mg/dL (ref 0.50–1.35)
GFR, EST AFRICAN AMERICAN: 88 mL/min — AB (ref >90)
GFR, EST NON AFRICAN AMERICAN: 76 mL/min — AB (ref >90)
Glucose, Bld: 168 mg/dL — ABNORMAL HIGH (ref 70–99)
POTASSIUM: 4.1 meq/L (ref 3.7–5.3)
SODIUM: 134 meq/L — AB (ref 137–147)

## 2014-04-13 LAB — CBC
HCT: 41.7 % (ref 39.0–52.0)
Hemoglobin: 14.3 g/dL (ref 13.0–17.0)
MCH: 35.5 pg — ABNORMAL HIGH (ref 26.0–34.0)
MCHC: 34.3 g/dL (ref 30.0–36.0)
MCV: 103.5 fL — ABNORMAL HIGH (ref 78.0–100.0)
Platelets: 200 10*3/uL (ref 150–400)
RBC: 4.03 MIL/uL — AB (ref 4.22–5.81)
RDW: 12.4 % (ref 11.5–15.5)
WBC: 11.2 10*3/uL — ABNORMAL HIGH (ref 4.0–10.5)

## 2014-04-13 LAB — HEPATIC FUNCTION PANEL
ALBUMIN: 3.9 g/dL (ref 3.5–5.2)
ALT: 127 U/L — ABNORMAL HIGH (ref 0–53)
AST: 110 U/L — ABNORMAL HIGH (ref 0–37)
Alkaline Phosphatase: 176 U/L — ABNORMAL HIGH (ref 39–117)
BILIRUBIN DIRECT: 0.2 mg/dL (ref 0.0–0.3)
Indirect Bilirubin: 0.5 mg/dL (ref 0.3–0.9)
Total Bilirubin: 0.7 mg/dL (ref 0.3–1.2)
Total Protein: 8.2 g/dL (ref 6.0–8.3)

## 2014-04-13 LAB — ETHANOL: Alcohol, Ethyl (B): <11 mg/dL (ref 0–11)

## 2014-04-13 LAB — LACTIC ACID, PLASMA: LACTIC ACID, VENOUS: 2.5 mmol/L — AB (ref 0.5–2.2)

## 2014-04-13 LAB — PRO B NATRIURETIC PEPTIDE: PRO B NATRI PEPTIDE: 127.3 pg/mL — AB (ref 0–125)

## 2014-04-13 MED ORDER — ONDANSETRON HCL 4 MG/2ML IJ SOLN
4.0000 mg | Freq: Four times a day (QID) | INTRAMUSCULAR | Status: DC | PRN
  Administered 2014-04-14: 4 mg via INTRAVENOUS
  Filled 2014-04-13: qty 2

## 2014-04-13 MED ORDER — LORAZEPAM 1 MG PO TABS
1.0000 mg | ORAL_TABLET | Freq: Four times a day (QID) | ORAL | Status: DC | PRN
  Administered 2014-04-14: 1 mg via ORAL
  Filled 2014-04-13 (×2): qty 1

## 2014-04-13 MED ORDER — LORAZEPAM 2 MG/ML IJ SOLN
1.0000 mg | Freq: Four times a day (QID) | INTRAMUSCULAR | Status: DC | PRN
  Administered 2014-04-14 – 2014-04-16 (×3): 1 mg via INTRAVENOUS
  Filled 2014-04-13 (×4): qty 1

## 2014-04-13 MED ORDER — LORAZEPAM 1 MG PO TABS
0.0000 mg | ORAL_TABLET | Freq: Two times a day (BID) | ORAL | Status: DC
  Administered 2014-04-13: 2 mg via ORAL
  Filled 2014-04-13 (×2): qty 2

## 2014-04-13 MED ORDER — LORAZEPAM 2 MG/ML IJ SOLN
0.0000 mg | Freq: Four times a day (QID) | INTRAMUSCULAR | Status: AC
  Administered 2014-04-13: 2 mg via INTRAVENOUS
  Administered 2014-04-14: 1 mg via INTRAVENOUS
  Administered 2014-04-14 (×2): 2 mg via INTRAVENOUS
  Administered 2014-04-15 (×4): 1 mg via INTRAVENOUS
  Filled 2014-04-13 (×9): qty 1

## 2014-04-13 MED ORDER — ACETAMINOPHEN 325 MG PO TABS: 650.0000 mg | ORAL_TABLET | ORAL | Status: DC | PRN

## 2014-04-13 MED ORDER — LORAZEPAM 2 MG/ML IJ SOLN: 0.0000 mg | Freq: Two times a day (BID) | INTRAMUSCULAR | Status: DC

## 2014-04-13 MED ORDER — THIAMINE HCL 100 MG/ML IJ SOLN
100.0000 mg | Freq: Every day | INTRAMUSCULAR | Status: DC
  Filled 2014-04-13: qty 1

## 2014-04-13 MED ORDER — QUETIAPINE FUMARATE ER 50 MG PO TB24
100.0000 mg | ORAL_TABLET | Freq: Every day | ORAL | Status: DC
  Administered 2014-04-13 – 2014-04-15 (×3): 100 mg via ORAL
  Filled 2014-04-13 (×4): qty 2

## 2014-04-13 MED ORDER — COLCHICINE 0.6 MG PO TABS
0.6000 mg | ORAL_TABLET | Freq: Every day | ORAL | Status: DC | PRN
  Filled 2014-04-13: qty 1

## 2014-04-13 MED ORDER — LORAZEPAM 1 MG PO TABS: 0.0000 mg | ORAL_TABLET | Freq: Four times a day (QID) | ORAL | Status: DC

## 2014-04-13 MED ORDER — GI COCKTAIL ~~LOC~~
30.0000 mL | Freq: Once | ORAL | Status: AC
  Administered 2014-04-13: 30 mL via ORAL
  Filled 2014-04-13: qty 30

## 2014-04-13 MED ORDER — VITAMIN B-1 100 MG PO TABS
100.0000 mg | ORAL_TABLET | Freq: Every day | ORAL | Status: DC
  Administered 2014-04-13: 100 mg via ORAL
  Filled 2014-04-13 (×2): qty 1

## 2014-04-13 MED ORDER — OXYCODONE-ACETAMINOPHEN 5-325 MG PO TABS
1.0000 | ORAL_TABLET | ORAL | Status: DC | PRN
Start: 2014-04-13 — End: 2014-04-16
  Administered 2014-04-13 – 2014-04-16 (×11): 2 via ORAL
  Filled 2014-04-13 (×11): qty 2

## 2014-04-13 MED ORDER — LORAZEPAM 2 MG/ML IJ SOLN: 0.0000 mg | Freq: Four times a day (QID) | INTRAMUSCULAR | Status: DC

## 2014-04-13 MED ORDER — ASPIRIN EC 325 MG PO TBEC
325.0000 mg | DELAYED_RELEASE_TABLET | Freq: Every day | ORAL | Status: DC
  Administered 2014-04-13 – 2014-04-16 (×4): 325 mg via ORAL
  Filled 2014-04-13 (×4): qty 1

## 2014-04-13 MED ORDER — LEVOTHYROXINE SODIUM 125 MCG PO TABS
125.0000 ug | ORAL_TABLET | Freq: Every day | ORAL | Status: DC
  Administered 2014-04-14 – 2014-04-16 (×2): 125 ug via ORAL
  Filled 2014-04-13 (×4): qty 1

## 2014-04-13 MED ORDER — HEPARIN SODIUM (PORCINE) 5000 UNIT/ML IJ SOLN
5000.0000 [IU] | Freq: Three times a day (TID) | INTRAMUSCULAR | Status: DC
  Administered 2014-04-13 – 2014-04-16 (×9): 5000 [IU] via SUBCUTANEOUS
  Filled 2014-04-13 (×11): qty 1

## 2014-04-13 MED ORDER — LORAZEPAM 2 MG/ML IJ SOLN
0.0000 mg | Freq: Two times a day (BID) | INTRAMUSCULAR | Status: DC
  Administered 2014-04-16: 1 mg via INTRAVENOUS
  Administered 2014-04-16: 2 mg via INTRAVENOUS
  Filled 2014-04-13: qty 1

## 2014-04-13 NOTE — ED Notes (Signed)
Pt states he feels like is withdrawing from alcohol, states he usually drinks a 1/5 a day, last time had that was yesterday, states has not had anything to drink today, states having centralized chest pain and shortness of breath, states has been more depressed lately, denies SI/HI, pt states he hears noises like whistles and bells.

## 2014-04-13 NOTE — ED Notes (Signed)
Per pt, states central chest pain for 24 hours-radiates to back-states it feels like it is skipping beats

## 2014-04-13 NOTE — H&P (Signed)
[facility] History and Physical  Paul CapersJames Jefferson Cobbs [id number] DOB: 18-Mar-1956 DOA: 04/13/2014  Referring physician: EDP PCP: No PCP Per Patient   Chief Complaint: Chest pain, Detox   HPI: Paul CapersJames Jefferson Paul Jefferson is a 58 y.o. male who presents to the ED for evaluation of chest pain and wishes to detox from EtOH.  With regard to chest pain, he reports over 24 hours of intermittent, substernal chest pain with radiation to his back.  Pain is a pressure like sensation, waxing and waning.  Nothing makes it better or worse.  Associated nausea and SOB.  Prior cardiac cath in hospital 8 years ago.  Regarding EtOH detox:  He admits to being a very heavy drinker, fifth of liquor daily, wants to quit.  He also reports current withdrawal symptoms including anxiety from EtOH, has had seizures and DTs in the past.  Review of Systems: Systems reviewed.  As above, otherwise negative  Past Medical History  Diagnosis Date  . Thyroid disease   . Neuromuscular disorder   . Depression    Past Surgical History  Procedure Laterality Date  . Cholecystectomy     Social History:  reports that he has been smoking Cigarettes.  He has been smoking about 0.50 packs per day. He does not have any smokeless tobacco history on file. He reports that he does not drink alcohol or use illicit drugs.  Allergies  Allergen Reactions  . Voltaren [Diclofenac Sodium] Other (See Comments)    Per patients states this medication caused him to feel nervous, dizziness, and light headed and he does not want to take it again.    No family history on file.   Prior to Admission medications   Medication Sig Start Date End Date Taking? Authorizing Provider  clonazePAM (KLONOPIN) 1 MG tablet Take 1 mg by mouth 2 (two) times daily.   Yes Historical Provider, MD  colchicine 0.6 MG tablet Take 0.6 mg by mouth daily as needed (gout flare up).   Yes Historical Provider, MD  levothyroxine (SYNTHROID, LEVOTHROID) 125 MCG tablet Take  125 mcg by mouth daily before breakfast.  03/06/13  Yes Peyton Najjaravid H Hopper, MD  oxyCODONE-acetaminophen (PERCOCET/ROXICET) 5-325 MG per tablet Take 1-2 tablets by mouth every 4 (four) hours as needed for moderate pain or severe pain. 02/11/14  Yes Arthor CaptainAbigail Harris, PA-C  QUEtiapine (SEROQUEL XR) 50 MG TB24 24 hr tablet Take 100 mg by mouth at bedtime.   Yes Historical Provider, MD  allopurinol (ZYLOPRIM) 100 MG tablet Take 1 tablet (100 mg total) by mouth daily. 03/06/13   Peyton Najjaravid H Hopper, MD  meloxicam (MOBIC) 15 MG tablet Take 1 tablet (15 mg total) by mouth daily. Take 1 daily with food. 02/11/14   Arthor CaptainAbigail Harris, PA-C   Physical Exam: Filed Vitals:   04/13/14 2030  BP: 117/75  Pulse: 64  Temp:   Resp: 12    BP 117/75 mmHg  Pulse 64  Temp(Src) 97.7 F (36.5 C) (Oral)  Resp 12  SpO2 96%  General Appearance:    Alert, oriented, no distress, appears stated age  Head:    Normocephalic, atraumatic  Eyes:    PERRL, EOMI, sclera non-icteric        Nose:   Nares without drainage or epistaxis. Mucosa, turbinates normal  Throat:   Moist mucous membranes. Oropharynx without erythema or exudate.  Neck:   Supple. No carotid bruits.  No thyromegaly.  No lymphadenopathy.   Back:     No CVA tenderness, no spinal tenderness  Lungs:     Clear to auscultation bilaterally, without wheezes, rhonchi or rales  Chest wall:    No tenderness to palpitation  Heart:    Regular rate and rhythm without murmurs, gallops, rubs  Abdomen:     Soft, non-tender, nondistended, normal bowel sounds, no organomegaly  Genitalia:    deferred  Rectal:    deferred  Extremities:   No clubbing, cyanosis or edema.  Pulses:   2+ and symmetric all extremities  Skin:   Skin color, texture, turgor normal, no rashes or lesions  Lymph nodes:   Cervical, supraclavicular, and axillary nodes normal  Neurologic:   CNII-XII intact. Normal strength, sensation and reflexes      throughout    Labs on Admission:  Basic Metabolic  Panel:  Recent Labs Lab 04/13/14 1521  NA 134*  K 4.1  CL 92*  CO2 21  GLUCOSE 168*  BUN 12  CREATININE 1.05  CALCIUM 9.8   Liver Function Tests: No results for input(s): AST, ALT, ALKPHOS, BILITOT, PROT, ALBUMIN in the last 168 hours. No results for input(s): LIPASE, AMYLASE in the last 168 hours. No results for input(s): AMMONIA in the last 168 hours. CBC:  Recent Labs Lab 04/13/14 1521  WBC 11.2*  HGB 14.3  HCT 41.7  MCV 103.5*  PLT 200   Cardiac Enzymes: No results for input(s): CKTOTAL, CKMB, CKMBINDEX, TROPONINI in the last 168 hours.  BNP (last 3 results)  Recent Labs  04/13/14 1520  PROBNP 127.3*   CBG: No results for input(s): GLUCAP in the last 168 hours.  Radiological Exams on Admission: Dg Chest Port 1 View  04/13/2014   CLINICAL DATA:  Mid to LEFT side chest pain radiating around to back, shortness of breath and cough since yesterday, nausea, history smoking  EXAM: PORTABLE CHEST - 1 VIEW  COMPARISON:  Portable exam 2034 hr without priors for comparison  FINDINGS: Upper normal heart size.  Normal mediastinal contours and pulmonary vascularity.  Minimal LEFT basilar atelectasis.  Lungs otherwise clear.  No pleural effusion or pneumothorax.  No acute osseous findings.  IMPRESSION: Minimal LEFT basilar atelectasis.   Electronically Signed   By: Ulyses SouthwardMark  Boles M.D.   On: 04/13/2014 20:43    EKG: Independently reviewed.  Non-specific findings, no priors for comparison.  Assessment/Plan Principal Problem:   Chest pain Active Problems:   Metabolic acidosis, increased anion gap   Alcohol withdrawal   1. Chest pain - 1. Serial trops ordered 2. Tele monitor 3. Lipid panel ordered 4. ASA 325 2. High anion gap - 1. Lactate ordered 2. LFTs ordered given ongoing EtOH abuse 3. EtOH withdrawal - CIWA protocol, ativan PRN, thiamine and folate.    Code Status: Full Code  Family Communication: No family in room Disposition Plan: Admit to obs   Time  spent: 50 min  GARDNER, JARED M. [facility] Pager [phone]  If 7AM-7PM, please contact the day team taking care of the patient [email or web address] Password [email or web address] 04/13/2014, 10:37 PM

## 2014-04-13 NOTE — ED Provider Notes (Signed)
CSN: [id number]     Arrival date & time 04/13/14  1502 History   First MD Initiated Contact with Patient 04/13/14 1947     Chief Complaint  Patient presents with  . Chest Pain  . detox      HPI Comments: Patient presents for evaluation of chest pain and wanting detox from alcohol. In terms of chest pain, he describes 2 days of intermittent substernal chest pain and at times back pain described as pain and pressure. Pain is waxing and waning with no clear alleviating or worsening factors. Pain is moderate in nature and nonradiating. He has associated nausea and shortness of breath. Denies fevers, cough, abdominal pain, vomiting, leg swelling. He had similar symptoms once previously but they were milder.  He has a history of hyperlipidemia. He had a prior cardiac cath another hospital 8 or more years ago.  In terms of alcohol abuse Mr. Dan HumphreysWalker reports that he is a heavy drinker of liquor, drinking approximately a fifth of liquor daily. He is tired of drinking and comes in for detox.  He feels his life is not worth living but has no discrete suicidal ideation or plan. He has a history of alcohol withdrawals with prior seizures.  Patient is a 58 y.o. male presenting with chest pain. The history is provided by the patient.  Chest Pain   Past Medical History  Diagnosis Date  . Thyroid disease   . Neuromuscular disorder   . Depression    Past Surgical History  Procedure Laterality Date  . Cholecystectomy     No family history on file. History  Substance Use Topics  . Smoking status: Current Every Day Smoker -- 0.50 packs/day    Types: Cigarettes  . Smokeless tobacco: Not on file  . Alcohol Use: No    Review of Systems  Cardiovascular: Positive for chest pain.  All other systems reviewed and are negative.     Allergies  Voltaren  Home Medications   Prior to Admission medications   Medication Sig Start Date End Date Taking? Authorizing Provider  clonazePAM (KLONOPIN) 1 MG  tablet Take 1 mg by mouth 2 (two) times daily.   Yes Historical Provider, MD  colchicine 0.6 MG tablet Take 0.6 mg by mouth daily as needed (gout flare up).   Yes Historical Provider, MD  levothyroxine (SYNTHROID, LEVOTHROID) 125 MCG tablet Take 125 mcg by mouth daily before breakfast.  03/06/13  Yes Peyton Najjaravid H Hopper, MD  oxyCODONE-acetaminophen (PERCOCET/ROXICET) 5-325 MG per tablet Take 1-2 tablets by mouth every 4 (four) hours as needed for moderate pain or severe pain. 02/11/14  Yes Arthor CaptainAbigail Harris, PA-C  QUEtiapine (SEROQUEL XR) 50 MG TB24 24 hr tablet Take 100 mg by mouth at bedtime.   Yes Historical Provider, MD  allopurinol (ZYLOPRIM) 100 MG tablet Take 1 tablet (100 mg total) by mouth daily. 03/06/13   Peyton Najjaravid H Hopper, MD  meloxicam (MOBIC) 15 MG tablet Take 1 tablet (15 mg total) by mouth daily. Take 1 daily with food. 02/11/14   Abigail Harris, PA-C   BP 136/86 mmHg  Pulse 68  Temp(Src) 97.7 F (36.5 C) (Oral)  Resp 22  SpO2 99% Physical Exam  Constitutional: He is oriented to person, place, and time. He appears well-developed and well-nourished.  HENT:  Head: Normocephalic and atraumatic.  Cardiovascular: Normal rate and regular rhythm.   No murmur heard. Pulmonary/Chest: Effort normal and breath sounds normal. No respiratory distress.  Abdominal: Soft. There is no tenderness. There is no guarding.  Musculoskeletal: He exhibits no edema or tenderness.  Neurological: He is alert and oriented to person, place, and time.  Skin: Skin is warm.  Psychiatric:  Flat affect  Nursing note and vitals reviewed.   ED Course  Procedures (including critical care time) Labs Review Labs Reviewed  PRO B NATRIURETIC PEPTIDE - Abnormal; Notable for the following:    Pro B Natriuretic peptide (BNP) 127.3 (*)    All other components within normal limits  BASIC METABOLIC PANEL - Abnormal; Notable for the following:    Sodium 134 (*)    Chloride 92 (*)    Glucose, Bld 168 (*)    GFR calc non Af  Amer 76 (*)    GFR calc Af Amer 88 (*)    Anion gap 21 (*)    All other components within normal limits  CBC - Abnormal; Notable for the following:    WBC 11.2 (*)    RBC 4.03 (*)    MCV 103.5 (*)    MCH 35.5 (*)    All other components within normal limits  LACTIC ACID, PLASMA - Abnormal; Notable for the following:    Lactic Acid, Venous 2.5 (*)    All other components within normal limits  HEPATIC FUNCTION PANEL - Abnormal; Notable for the following:    AST 110 (*)    ALT 127 (*)    Alkaline Phosphatase 176 (*)    All other components within normal limits  ETHANOL  URINE RAPID DRUG SCREEN (HOSP PERFORMED)  TROPONIN I  TROPONIN I  TROPONIN I  LIPID PANEL  I-STAT TROPOININ, ED  I-STAT TROPOININ, ED    Imaging Review Dg Chest Port 1 View  04/13/2014   CLINICAL DATA:  Mid to LEFT side chest pain radiating around to back, shortness of breath and cough since yesterday, nausea, history smoking  EXAM: PORTABLE CHEST - 1 VIEW  COMPARISON:  Portable exam 2034 hr without priors for comparison  FINDINGS: Upper normal heart size.  Normal mediastinal contours and pulmonary vascularity.  Minimal LEFT basilar atelectasis.  Lungs otherwise clear.  No pleural effusion or pneumothorax.  No acute osseous findings.  IMPRESSION: Minimal LEFT basilar atelectasis.   Electronically Signed   By: Ulyses SouthwardMark  Boles M.D.   On: 04/13/2014 20:43     EKG Interpretation   Date/Time:  Tuesday April 13 2014 15:15:25 EST Ventricular Rate:  84 PR Interval:  154 QRS Duration: 96 QT Interval:  386 QTC Calculation: 456 R Axis:   156 Text Interpretation:  Sinus rhythm Probable left atrial enlargement  Probable right ventricular hypertrophy Borderline T abnormalities,  anterior leads Minimal ST elevation, lateral leads Baseline wander in  lead(s) I III V3 No old tracing to compare Confirmed by [facility] CenterWENTZ  MD, ELLIOTT  [phone]([id number]) on 04/13/2014 4:48:56 PM      MDM   Final diagnoses:  Chest pain at rest  Alcohol  abuse    Patient. Consents for 2 complaints: chest pain and requesting alcohol detox. In terms of chest pain - patient has heart score 3-4 with intermittent symptoms - recommend stress testing to rule out cardiac cause. Clinical picture and not consistent with PE, pneumonia, or dissection.terms of alcohol abuse-patient with history of EtOH withdrawals with seizures, patient with mild tremor in department, will discuss with medicine regarding stress testing as well as detox.    Tilden FossaElizabeth Marena Witts, MD 04/14/14 0110

## 2014-04-14 DIAGNOSIS — E871 Hypo-osmolality and hyponatremia: Secondary | ICD-10-CM

## 2014-04-14 DIAGNOSIS — F10232 Alcohol dependence with withdrawal with perceptual disturbance: Secondary | ICD-10-CM

## 2014-04-14 LAB — LIPID PANEL
CHOLESTEROL: 216 mg/dL — AB (ref 0–200)
HDL: 40 mg/dL (ref >39)
LDL Cholesterol: 108 mg/dL — ABNORMAL HIGH (ref 0–99)
TRIGLYCERIDES: 342 mg/dL — AB (ref <150)
Total CHOL/HDL Ratio: 5.4 RATIO
VLDL: 68 mg/dL — ABNORMAL HIGH (ref 0–40)

## 2014-04-14 LAB — TROPONIN I

## 2014-04-14 MED ORDER — THIAMINE HCL 100 MG/ML IJ SOLN
100.0000 mg | Freq: Every day | INTRAMUSCULAR | Status: DC
  Filled 2014-04-14 (×3): qty 1

## 2014-04-14 MED ORDER — ALBUTEROL SULFATE (2.5 MG/3ML) 0.083% IN NEBU
2.5000 mg | INHALATION_SOLUTION | Freq: Four times a day (QID) | RESPIRATORY_TRACT | Status: DC | PRN
  Administered 2014-04-14 (×2): 2.5 mg via RESPIRATORY_TRACT
  Filled 2014-04-14: qty 3

## 2014-04-14 MED ORDER — MOMETASONE FURO-FORMOTEROL FUM 100-5 MCG/ACT IN AERO
2.0000 | INHALATION_SPRAY | Freq: Two times a day (BID) | RESPIRATORY_TRACT | Status: DC
  Administered 2014-04-14 – 2014-04-16 (×5): 2 via RESPIRATORY_TRACT
  Filled 2014-04-14 (×2): qty 8.8

## 2014-04-14 MED ORDER — LORAZEPAM 2 MG/ML IJ SOLN
1.0000 mg | Freq: Once | INTRAMUSCULAR | Status: AC
  Administered 2014-04-14: 1 mg via INTRAVENOUS
  Filled 2014-04-14: qty 1

## 2014-04-14 MED ORDER — LORAZEPAM 2 MG/ML IJ SOLN
2.0000 mg | Freq: Once | INTRAMUSCULAR | Status: AC
  Administered 2014-04-14: 2 mg via INTRAVENOUS
  Filled 2014-04-14: qty 1

## 2014-04-14 MED ORDER — MORPHINE SULFATE 2 MG/ML IJ SOLN
2.0000 mg | Freq: Once | INTRAMUSCULAR | Status: AC
  Administered 2014-04-14: 2 mg via INTRAVENOUS
  Filled 2014-04-14: qty 1

## 2014-04-14 MED ORDER — IPRATROPIUM-ALBUTEROL 0.5-2.5 (3) MG/3ML IN SOLN
3.0000 mL | Freq: Two times a day (BID) | RESPIRATORY_TRACT | Status: DC
  Administered 2014-04-15 – 2014-04-16 (×3): 3 mL via RESPIRATORY_TRACT
  Filled 2014-04-14 (×4): qty 3

## 2014-04-14 MED ORDER — VITAMIN B-1 100 MG PO TABS
100.0000 mg | ORAL_TABLET | Freq: Every day | ORAL | Status: DC
  Administered 2014-04-14 – 2014-04-16 (×3): 100 mg via ORAL
  Filled 2014-04-14 (×3): qty 1

## 2014-04-14 MED ORDER — SODIUM CHLORIDE 0.9 % IV SOLN
INTRAVENOUS | Status: DC
  Administered 2014-04-14 – 2014-04-15 (×2): via INTRAVENOUS

## 2014-04-14 MED ORDER — PNEUMOCOCCAL VAC POLYVALENT 25 MCG/0.5ML IJ INJ
0.5000 mL | INJECTION | INTRAMUSCULAR | Status: AC
  Administered 2014-04-15: 0.5 mL via INTRAMUSCULAR
  Filled 2014-04-14 (×2): qty 0.5

## 2014-04-14 MED ORDER — ALBUTEROL SULFATE (2.5 MG/3ML) 0.083% IN NEBU
INHALATION_SOLUTION | RESPIRATORY_TRACT | Status: AC
  Filled 2014-04-14: qty 3

## 2014-04-14 NOTE — Progress Notes (Signed)
UR completed 

## 2014-04-14 NOTE — Progress Notes (Signed)
Clinical Social Work Department BRIEF PSYCHOSOCIAL ASSESSMENT 04/14/2014  Patient:  Paul Jefferson,Paul Jefferson     Account Number:  [id number]     Admit date:  04/13/2014  Clinical Social Worker:  Orpah GreekFOLEY,Jaymes Hang, LCSWA  Date/Time:  04/14/2014 04:07 PM  Referred by:  Physician  Date Referred:  04/14/2014 Referred for  Substance Abuse   Other Referral:   Interview type:  Patient Other interview type:    PSYCHOSOCIAL DATA Living Status:  ALONE Admitted from facility:   Level of care:   Primary support name:  Paul Jefferson (sister) ph#: [phone] Primary support relationship to patient:  SIBLING Degree of support available:   good    CURRENT CONCERNS Current Concerns  Substance Abuse   Other Concerns:    SOCIAL WORK ASSESSMENT / PLAN CSW received consult for ETOH abuse.   Assessment/plan status:  [facility] to [facility] Other assessment/ plan:   Information/referral to community resources:   CSW provided patient with Substance Abuse Resouce List - Inpatient & Outpatient.    PATIENT'S/FAMILY'S RESPONSE TO PLAN OF CARE: Patient informed CSW that patient would like information on insurance - CSW informed patient that Financial Counselor, Jasmine DecemberSharon would be meeting with him. Patient also informed CSW that he is familiar with [facility], [facility] Cena BentonVega aware & has called in psych consult. Patient states that he has been more depressed lately.       Lincoln MaxinKelly Tennessee Hanlon, LCSW [facility] Clinical Social Worker cell #: [phone]

## 2014-04-14 NOTE — Progress Notes (Signed)
[facility] PROGRESS NOTE  Paul CapersJames Mark Jefferson [id number] DOB: 01-29-1956 DOA: 04/13/2014 PCP: No PCP Per Patient  Assessment/Plan:  Principal Problem:   Chest pain - Troponins negative - Pt describes it as sharp pain off the left side of chest that shoots to the back. Sounds atypical.  But he is obese, smokes, and has not been taking his cholesterol medication. As such I would like to obtain stress test - Nothing by mouth at midnight for stress testing  Active Problems:   Metabolic acidosis, increased anion gap - Will provide normal saline administration. - we'll reassess BMP next a.m. Most likely related to alcohol use    Alcohol withdrawal - CIWA protocol.  Hyponatremia - not well controlled currently and still slightly below normal levels. Most likely due to poor oral solute intake. - We'll place on normal saline  Code Status: Full Family Communication: no family at bedside Disposition Plan: with improvement in condition and negative and chest pain rule out will plan on discharging.   Consultants:  none  Procedures:  Myoview pending  Antibiotics:  none  HPI/Subjective: Patient has no new complaints. No acute issues overnight. Nursing reports patient has required Ativan continuously for his alcohol withdrawal  Objective: Filed Vitals:   04/14/14 0532  BP: 110/69  Pulse: 76  Temp: 98.1 F (36.7 C)  Resp: 16    Intake/Output Summary (Last 24 hours) at 04/14/14 1414 Last data filed at 04/14/14 0500  Gross per 24 hour  Intake    240 ml  Output      0 ml  Net    240 ml   Filed Weights   04/13/14 2358  Weight: 126.962 kg (279 lb 14.4 oz)    Exam:   General:  Patient in no acute distress, alert and awake  Cardiovascular: regular rate and rhythm, no murmurs or rubs  Respiratory: clear to auscultation bilaterally, no wheezes  Abdomen: soft, no guarding, nontender, obese  Musculoskeletal: no clubbing   Data Reviewed: Basic Metabolic  Panel:  Recent Labs Lab 04/13/14 1521  NA 134*  K 4.1  CL 92*  CO2 21  GLUCOSE 168*  BUN 12  CREATININE 1.05  CALCIUM 9.8   Liver Function Tests:  Recent Labs Lab 04/13/14 2252  AST 110*  ALT 127*  ALKPHOS 176*  BILITOT 0.7  PROT 8.2  ALBUMIN 3.9   No results for input(s): LIPASE, AMYLASE in the last 168 hours. No results for input(s): AMMONIA in the last 168 hours. CBC:  Recent Labs Lab 04/13/14 1521  WBC 11.2*  HGB 14.3  HCT 41.7  MCV 103.5*  PLT 200   Cardiac Enzymes:  Recent Labs Lab 04/13/14 2252 04/14/14 0201 04/14/14 0456  TROPONINI <0.30 <0.30 <0.30   BNP (last 3 results)  Recent Labs  04/13/14 1520  PROBNP 127.3*   CBG: No results for input(s): GLUCAP in the last 168 hours.  No results found for this or any previous visit (from the past 240 hour(s)).   Studies: Dg Chest Port 1 View  04/13/2014   CLINICAL DATA:  Mid to LEFT side chest pain radiating around to back, shortness of breath and cough since yesterday, nausea, history smoking  EXAM: PORTABLE CHEST - 1 VIEW  COMPARISON:  Portable exam 2034 hr without priors for comparison  FINDINGS: Upper normal heart size.  Normal mediastinal contours and pulmonary vascularity.  Minimal LEFT basilar atelectasis.  Lungs otherwise clear.  No pleural effusion or pneumothorax.  No acute osseous findings.  IMPRESSION: Minimal  LEFT basilar atelectasis.   Electronically Signed   By: Ulyses SouthwardMark  Boles M.D.   On: 04/13/2014 20:43    Scheduled Meds: . albuterol      . aspirin EC  325 mg Oral Daily  . heparin  5,000 Units Subcutaneous 3 times per day  . levothyroxine  125 mcg Oral QAC breakfast  . LORazepam  0-4 mg Intravenous Q6H   Followed by  . [START ON 04/16/2014] LORazepam  0-4 mg Intravenous Q12H  . mometasone-formoterol  2 puff Inhalation BID  . [START ON 04/15/2014] pneumococcal 23 valent vaccine  0.5 mL Intramuscular Tomorrow-1000  . QUEtiapine  100 mg Oral QHS  . thiamine  100 mg Oral Daily   Or   . thiamine  100 mg Intravenous Daily   Continuous Infusions:    Time spent: > 35 minutes    Penny PiaVEGA, Judithe Keetch  [facility] Pager [phone]. If 7PM-7AM, please contact night-coverage at [email or web address], password [facility] 04/14/2014, 2:14 PM  LOS: 1 day

## 2014-04-14 NOTE — Progress Notes (Signed)
Patient came up to floor right before midnight. Around 0040, patient's CIWA was a 14 and patient complaining of chest pain with SOB. Patient given his PRN Ativan and it brought it down to 9. Patient was still complaining of chest pain 8 out of 10 with SOB. NP notified and new orders were given for a one time dose of morphine and orders for his inhaler he uses at home. Neither helped and around 0252, CIWA was back at a 12 and so NP was notified again and new orders were given for a one time dose of Ativan and told RN to call back if patient still having problems. During this time, patient was put on 2L [place] because his oxygen was 88% on room air. It brought his CIWA to a 9, but patient still saying they were having 8 out of 10 chest pain. NP notified again and new orders were given for a one time dose of Ativan and morphine. Will reassess patient.

## 2014-04-15 ENCOUNTER — Other Ambulatory Visit: Payer: Self-pay

## 2014-04-15 ENCOUNTER — Inpatient Hospital Stay (HOSPITAL_COMMUNITY): Payer: MEDICAID

## 2014-04-15 ENCOUNTER — Ambulatory Visit (HOSPITAL_COMMUNITY)
Admission: EM | Admit: 2014-04-15 | Discharge: 2014-04-15 | Disposition: A | Discharge status: Home / Self Care | Payer: Self-pay | Source: Home / Self Care | Attending: Family Medicine | Admitting: Family Medicine

## 2014-04-15 ENCOUNTER — Ambulatory Visit (HOSPITAL_COMMUNITY)
Admit: 2014-04-15 | Discharge: 2014-04-15 | Disposition: A | Discharge status: Home / Self Care | Payer: Self-pay | Attending: Family Medicine | Admitting: Family Medicine

## 2014-04-15 DIAGNOSIS — J9601 Acute respiratory failure with hypoxia: Secondary | ICD-10-CM

## 2014-04-15 DIAGNOSIS — R079 Chest pain, unspecified: Secondary | ICD-10-CM

## 2014-04-15 LAB — BASIC METABOLIC PANEL
Anion gap: 13 (ref 5–15)
BUN: 9 mg/dL (ref 6–23)
CHLORIDE: 97 meq/L (ref 96–112)
CO2: 25 mEq/L (ref 19–32)
Calcium: 9.5 mg/dL (ref 8.4–10.5)
Creatinine, Ser: 1.13 mg/dL (ref 0.50–1.35)
GFR, EST AFRICAN AMERICAN: 80 mL/min — AB (ref >90)
GFR, EST NON AFRICAN AMERICAN: 69 mL/min — AB (ref >90)
GLUCOSE: 110 mg/dL — AB (ref 70–99)
POTASSIUM: 4.6 meq/L (ref 3.7–5.3)
Sodium: 135 mEq/L — ABNORMAL LOW (ref 137–147)

## 2014-04-15 LAB — PRO B NATRIURETIC PEPTIDE: Pro B Natriuretic peptide (BNP): 81.6 pg/mL (ref 0–125)

## 2014-04-15 MED ORDER — REGADENOSON 0.4 MG/5ML IV SOLN
INTRAVENOUS | Status: AC
  Administered 2014-04-15: 0.4 mg via INTRAVENOUS
  Filled 2014-04-15: qty 5

## 2014-04-15 MED ORDER — TECHNETIUM TC 99M SESTAMIBI GENERIC - CARDIOLITE
30.0000 | Freq: Once | INTRAVENOUS | Status: AC | PRN
  Administered 2014-04-15: 30 via INTRAVENOUS

## 2014-04-15 MED ORDER — REGADENOSON 0.4 MG/5ML IV SOLN
0.4000 mg | Freq: Once | INTRAVENOUS | Status: AC
  Administered 2014-04-15: 0.4 mg via INTRAVENOUS

## 2014-04-15 MED ORDER — IOHEXOL 300 MG/ML  SOLN
80.0000 mL | Freq: Once | INTRAMUSCULAR | Status: AC | PRN
  Administered 2014-04-15: 80 mL via INTRAVENOUS

## 2014-04-15 MED ORDER — TECHNETIUM TC 99M SESTAMIBI GENERIC - CARDIOLITE
10.0000 | Freq: Once | INTRAVENOUS | Status: AC | PRN
  Administered 2014-04-15: 10 via INTRAVENOUS

## 2014-04-15 NOTE — Progress Notes (Signed)
Rechecking pt's sats intermittently showed marked improvement over last hour, oxygen decreased slowly, sats stayed above 90%.  Pt up to use bathroom, removed oxygen to ambulate stating he did not want extra tubing w/ IV pole, no SOB noted.  Sats 92-93% on room air after return from bathroom.  No SOB noted and pt w/out complaints.

## 2014-04-15 NOTE — Progress Notes (Signed)
[facility] PROGRESS NOTE  Lucienne CapersJames Mark Gelles ZOX:[id number] DOB: 23-Nov-1955 DOA: 04/13/2014 PCP: No PCP Per Patient  Assessment/Plan:  Principal Problem:   Chest pain - Troponins negative - Pt describes it as sharp pain off the left side of chest that shoots to the back. Sounds atypical.  But he is obese, smokes, and has not been taking his cholesterol medication. As such I would like to obtain stress test. Results pending  Acute respiratory failure with Hypoxia:  - New problem as patient has required oxygen supplementation. - Will obtain BNP - CT scan of chest - consider Echocardiogram should BNP come back elevated. Also would consider cardiology consult at that juncture.  Active Problems:   Metabolic acidosis, increased anion gap - Will provide normal saline administration. - we'll reassess BMP next a.m. Most likely related to alcohol use    Alcohol withdrawal - CIWA protocol.  Hyponatremia - not well controlled currently and still slightly below normal levels. Most likely due to poor oral solute intake. - Was administered normal saline - Will reassess next am.  Code Status: Full Family Communication: no family at bedside Disposition Plan: with improvement in condition and negative and chest pain rule out will plan on discharging.   Consultants:  none  Procedures:  Myoview pending  Antibiotics:  none  HPI/Subjective: Pt has been more short of breath today.  [facility]  Objective: Filed Vitals:   04/15/14 0546  BP: 114/76  Pulse: 76  Temp: 97.2 F (36.2 C)  Resp: 18    Intake/Output Summary (Last 24 hours) at 04/15/14 1347 Last data filed at 04/15/14 0600  Gross per 24 hour  Intake 1676.25 ml  Output      0 ml  Net 1676.25 ml   Filed Weights   04/13/14 2358  Weight: 126.962 kg (279 lb 14.4 oz)    Exam:   General:  Patient in no acute distress, alert and awake  Cardiovascular: warm extremities, no  cyanosis  Respiratory: breathing comfortably on room air, no wheezes  Abdomen: soft, no guarding, nontender, obese  Musculoskeletal: no clubbing   Data Reviewed: Basic Metabolic Panel:  Recent Labs Lab 04/13/14 1521  NA 134*  K 4.1  CL 92*  CO2 21  GLUCOSE 168*  BUN 12  CREATININE 1.05  CALCIUM 9.8   Liver Function Tests:  Recent Labs Lab 04/13/14 2252  AST 110*  ALT 127*  ALKPHOS 176*  BILITOT 0.7  PROT 8.2  ALBUMIN 3.9   No results for input(s): LIPASE, AMYLASE in the last 168 hours. No results for input(s): AMMONIA in the last 168 hours. CBC:  Recent Labs Lab 04/13/14 1521  WBC 11.2*  HGB 14.3  HCT 41.7  MCV 103.5*  PLT 200   Cardiac Enzymes:  Recent Labs Lab 04/13/14 2252 04/14/14 0201 04/14/14 0456  TROPONINI <0.30 <0.30 <0.30   BNP (last 3 results)  Recent Labs  04/13/14 1520  PROBNP 127.3*   CBG: No results for input(s): GLUCAP in the last 168 hours.  No results found for this or any previous visit (from the past 240 hour(s)).   Studies: Dg Chest Port 1 View  04/13/2014   CLINICAL DATA:  Mid to LEFT side chest pain radiating around to back, shortness of breath and cough since yesterday, nausea, history smoking  EXAM: PORTABLE CHEST - 1 VIEW  COMPARISON:  Portable exam 2034 hr without priors for comparison  FINDINGS: Upper normal heart size.  Normal mediastinal contours and  pulmonary vascularity.  Minimal LEFT basilar atelectasis.  Lungs otherwise clear.  No pleural effusion or pneumothorax.  No acute osseous findings.  IMPRESSION: Minimal LEFT basilar atelectasis.   Electronically Signed   By: Mark  Boles M.D.   On: 04/13/2014 20:43    [place]du[place]ses Southward[place] Meds: . aspirin EC  325 mg Oral Daily  . heparin  5,000 Units Subcutaneous 3 times per day  . ipratropium-albuterol  3 mL Nebulization BID  . levothyroxine  125 mcg Oral QAC breakfast  . LORazepam  0-4 mg Intravenous Q6H   Followed by  . [START ON 04/16/2014] LORazepam  0-4 mg  Intravenous Q12H  . mometasone-formoterol  2 puff Inhalation BID  . pneumococcal 23 valent vaccine  0.5 mL Intramuscular Tomorrow-1000  . QUEtiapine  100 mg Oral QHS  . thiamine  100 mg Oral Daily   Or  . thiamine  100 mg Intravenous Daily   Continuous Infusions: . sodium chloride 75 mL/hr at 04/15/14 0509     Time spent: > 35 minutes    Penny PiaVEGA, Miraj Truss  [facility] Pager [phone]. If 7PM-7AM, please contact night-coverage at [email or web address], password [email or web address] 04/15/2014, 1:47 PM  LOS: 2 days

## 2014-04-15 NOTE — Plan of Care (Signed)
Problem: Phase I Progression Outcomes Goal: Hemodynamically stable Outcome: Completed/Met Date Met:  04/15/14     

## 2014-04-15 NOTE — Plan of Care (Signed)
Problem: Phase I Progression Outcomes Goal: Aspirin unless contraindicated Outcome: Completed/Met Date Met:  04/15/14 Goal: MD aware of Cardiac Marker results Outcome: Completed/Met Date Met:  04/15/14 Goal: Voiding-avoid urinary catheter unless indicated Outcome: Completed/Met Date Met:  04/15/14

## 2014-04-15 NOTE — Progress Notes (Signed)
Clinical Social Work  CSW received referral to complete psychosocial assessment. CSW reviewed chart and went to room to meet with patient. Patient on the phone with financial counselor and asked that CSW follow up later. CSW will continue to follow.  [place] Kaleiah [place], [place] [phone]

## 2014-04-15 NOTE — Progress Notes (Signed)
Patient ID: Paul CapersJames Mark Jefferson, male   DOB: Nov 17, 1955, 58 y.o.   MRN: [id number]  Spoke with Dr. Cena BentonVega, patient was left unit for Stress Test, so he was not seen today for psych consult.   Ariadna Setter,Paul R. 04/15/2014 3:03 PM

## 2014-04-15 NOTE — Plan of Care (Signed)
Problem: Consults Goal: Chest Pain Patient Education (See Patient Education module for education specifics.)  Outcome: Progressing Goal: Skin Care Protocol Initiated - if Braden Score 18 or less If consults are not indicated, leave blank or document N/A  Outcome: Not Applicable Date Met:  58/94/83 Goal: Tobacco Cessation referral if indicated Outcome: Progressing Goal: Nutrition Consult-if indicated Outcome: Not Applicable Date Met:  47/58/30 Goal: Diabetes Guidelines if Diabetic/Glucose > 140 If diabetic or lab glucose is > 140 mg/dl - Initiate Diabetes/Hyperglycemia Guidelines & Document Interventions  Outcome: Progressing  Problem: Phase I Progression Outcomes Goal: Anginal pain relieved Outcome: Not Progressing Goal: Other Phase I Outcomes/Goals Outcome: Not Applicable Date Met:  74/60/02  Problem: Phase II Progression Outcomes Goal: Hemodynamically stable Outcome: Progressing Goal: Anginal pain relieved Outcome: Not Progressing Goal: Stress Test if indicated Outcome: Completed/Met Date Met:  04/15/14 Goal: CV Risk Factors identified Outcome: Progressing Goal: If positive for MI, change to MI Path Outcome: Not Applicable Date Met:  98/47/30 Goal: Other Phase II Outcomes/Goals Outcome: Not Applicable Date Met:  85/69/43

## 2014-04-15 NOTE — Progress Notes (Addendum)
Lexiscan nuc completed. EKG nonacute. Patient tolerated well with mild transient dyspnea. Pox 92% at beginning of test on RA, 88% at end of test - placed back on [place]. (See prior nurse note regarding hypoxia this AM.). Of note he reports >24 hrs of continuous chest pain with negative troponins thus far - this chest pain was not present during the test. No wheezing. Images pending. Primary team to follow up on results (cardiology not involved this admission). [facility] is assigned to read the results. Dayna Dunn PA-C

## 2014-04-15 NOTE — Progress Notes (Signed)
Report received from [facility] that pts nailbeds were noted to be ashen in color.  Stated he checked pt's O2sats as [facility] had [facility] and his Sats were 84-85%.  Stated pt did not appear in any distress and had no complaints.  Stated he placed pt to [facility] and his Sats in creased to 89-90%.  I was requested by nuc med tech to check pt who was about to be scanned to recheck his sats.  He was noted to be 85% on [facility].  Pt alert and oriented, pink in color, nailbeds pink and he was in no apparent distress. No change in Sats noted after increase from 2 to 4L, so placed pt on 6LNC.  Sats increased to 92-93%.

## 2014-04-15 NOTE — Progress Notes (Signed)
S/w Dr. Cena BentonVega to update on patient status.  Stated he would like to proceed w/ stress test at this point as long as pt stable to determine cause of patient's chest pain.  Discussed outcome of conversation w/ Lucile Craterana Dunn, PA-C.  Per discussion w/ Dr. Cena BentonVega, will keep pts O2Sats at least 89-90% as pt is long term smoker who stated he is currently going through alcohol withdrawals.

## 2014-04-16 DIAGNOSIS — F1994 Other psychoactive substance use, unspecified with psychoactive substance-induced mood disorder: Secondary | ICD-10-CM

## 2014-04-16 DIAGNOSIS — F1020 Alcohol dependence, uncomplicated: Secondary | ICD-10-CM

## 2014-04-16 LAB — BASIC METABOLIC PANEL
Anion gap: 11 (ref 5–15)
BUN: 10 mg/dL (ref 6–23)
CHLORIDE: 100 meq/L (ref 96–112)
CO2: 27 meq/L (ref 19–32)
CREATININE: 1.38 mg/dL — AB (ref 0.50–1.35)
Calcium: 9.3 mg/dL (ref 8.4–10.5)
GFR calc Af Amer: 63 mL/min — ABNORMAL LOW (ref >90)
GFR calc non Af Amer: 54 mL/min — ABNORMAL LOW (ref >90)
Glucose, Bld: 98 mg/dL (ref 70–99)
POTASSIUM: 4.5 meq/L (ref 3.7–5.3)
Sodium: 138 mEq/L (ref 137–147)

## 2014-04-16 MED ORDER — FLUOXETINE HCL 20 MG PO CAPS
20.0000 mg | ORAL_CAPSULE | Freq: Every day | ORAL | Status: DC
  Administered 2014-04-16: 20 mg via ORAL
  Filled 2014-04-16: qty 1

## 2014-04-16 MED ORDER — FLUOXETINE HCL 20 MG PO CAPS: 20.0000 mg | ORAL_CAPSULE | Freq: Every day | ORAL | Status: DC

## 2014-04-16 MED ORDER — CHLORDIAZEPOXIDE HCL 25 MG PO CAPS: 25.0000 mg | ORAL_CAPSULE | Freq: Three times a day (TID) | ORAL | Status: DC | PRN

## 2014-04-16 MED ORDER — ASPIRIN EC 81 MG PO TBEC: 81.0000 mg | DELAYED_RELEASE_TABLET | Freq: Every day | ORAL | Status: AC

## 2014-04-16 NOTE — Discharge Summary (Signed)
Physician Discharge Summary  Paul CapersJames Mark Casa ZOX:[id number] DOB: 01-Apr-1956 DOA: 04/13/2014  PCP: No PCP Per Patient  Admit date: 04/13/2014 Discharge date: 04/16/2014  Time spent: > 35 minutes  Recommendations for Outpatient Follow-up:  1. Please see d/c instructions below 2. For alcohol withdrawal will provide librium 3. Pt was started on fluoxetine by psychiatrist 4. Pulmonary nodule should be re imaged in one year 11/16  Discharge Diagnoses:  Principal Problem:   Chest pain Active Problems:   Metabolic acidosis, increased anion gap   Alcohol withdrawal   Acute respiratory failure with hypoxia   Discharge Condition: stable  Diet recommendation: Heart healthy  Filed Weights   04/13/14 2358  Weight: 126.962 kg (279 lb 14.4 oz)    History of present illness:  Paul Jefferson is a 58 y.o. male who presents to the ED for evaluation of chest pain and wishes to detox from EtOH. With regard to chest pain, he reports over 24 hours of intermittent, substernal chest pain with radiation to his back.  Hospital Course:   Principal Problem:  Chest pain - Troponins negative - CT of chest reported areas of linear atelectasis - I suspect most likely due to anxiety. He was recently started on fluoxetine by psychiatrist  Acute respiratory failure with Hypoxia:  - chest CT showing pulmonary nodule and linear atelectasis - resolved without intervention. Most likely 2ary to anxiety  Active Problems:  Metabolic acidosis, increased anion gap - Will provide normal saline administration. - we'll reassess BMP next a.m. Most likely related to alcohol use   Alcohol withdrawal - Will discharge on librium  Hyponatremia - not well controlled currently and still slightly below normal levels. Most likely due to poor oral solute intake. - resolved  Procedures:  Please see above  Consultations:  Psychiatry  Discharge Exam: Filed Vitals:   04/16/14 0456  BP: 115/70   Pulse: 75  Temp: 98 F (36.7 C)  Resp: 18    General: pt in nad, alert and awake Cardiovascular: rrr, no mrg Respiratory: cta bl, no wheezes  Discharge Instructions You were cared for by a hospitalist during your hospital stay. If you have any questions about your discharge medications or the care you received while you were in the hospital after you are discharged, you can call the unit and asked to speak with the hospitalist on call if the hospitalist that took care of you is not available. Once you are discharged, your primary care physician will handle any further medical issues. Please note that NO REFILLS for any discharge medications will be authorized once you are discharged, as it is imperative that you return to your primary care physician (or establish a relationship with a primary care physician if you do not have one) for your aftercare needs so that they can reassess your need for medications and monitor your lab values.  Discharge Instructions    Call MD for:  extreme fatigue    Complete by:  As directed      Call MD for:  severe uncontrolled pain    Complete by:  As directed      Call MD for:  temperature >100.4    Complete by:  As directed      Diet - low sodium heart healthy    Complete by:  As directed      Discharge instructions    Complete by:  As directed   Please f/u with your primary care physician and or psychiatrist within the next 1-2 weeks  or sooner should any new concerns arise.     Increase activity slowly    Complete by:  As directed           Current Discharge Medication List    START taking these medications   Details  aspirin EC 81 MG tablet Take 1 tablet (81 mg total) by mouth daily. Qty: 30 tablet, Refills: 0    chlordiazePOXIDE (LIBRIUM) 25 MG capsule Take 1 capsule (25 mg total) by mouth 3 (three) times daily as needed for anxiety. Qty: 30 capsule, Refills: 0    FLUoxetine (PROZAC) 20 MG capsule Take 1 capsule (20 mg total) by mouth  daily. Qty: 30 capsule, Refills: 0      CONTINUE these medications which have NOT CHANGED   Details  clonazePAM (KLONOPIN) 1 MG tablet Take 1 mg by mouth 2 (two) times daily.    colchicine 0.6 MG tablet Take 0.6 mg by mouth daily as needed (gout flare up).    fluticasone-salmeterol (ADVAIR HFA) 115-21 MCG/ACT inhaler Inhale 2 puffs into the lungs 2 (two) times daily.    levothyroxine (SYNTHROID, LEVOTHROID) 125 MCG tablet Take 125 mcg by mouth daily before breakfast.     oxyCODONE-acetaminophen (PERCOCET/ROXICET) 5-325 MG per tablet Take 1-2 tablets by mouth every 4 (four) hours as needed for moderate pain or severe pain. Qty: 10 tablet, Refills: 0    QUEtiapine (SEROQUEL XR) 50 MG TB24 24 hr tablet Take 100 mg by mouth at bedtime.       Allergies  Allergen Reactions  . Voltaren [Diclofenac Sodium] Other (See Comments)    Per patients states this medication caused him to feel nervous, dizziness, and light headed and he does not want to take it again.      The results of significant diagnostics from this hospitalization (including imaging, microbiology, ancillary and laboratory) are listed below for reference.    Significant Diagnostic Studies: Ct Chest W Contrast  04/15/2014   CLINICAL DATA:  Chest pain, hypoxia, shortness of breath, smoker  EXAM: CT CHEST WITH CONTRAST  TECHNIQUE: Multidetector CT imaging of the chest was performed during intravenous contrast administration. Sagittal and coronal MPR images reconstructed from axial data set.  CONTRAST:  80mL OMNIPAQUE IOHEXOL 300 MG/ML  SOLN IV  COMPARISON:  None  FINDINGS: Thoracic vascular structures grossly patent on nondedicated exam.  No thoracic adenopathy.  Enlarged fatty liver.  Post cholecystectomy.  Few pancreatic calcifications identified.  Remaining visualized portions of upper abdomen unremarkable.  Scattered areas of linear atelectasis or scarring in both lungs.  Minimal emphysematous changes in upper lobes.  Scattered  subpleural blebs.  Nonspecific 4 mm LEFT lower lobe nodule image 44.  No additional mass, nodule, infiltrate, pleural effusion or pneumothorax.  No acute osseous findings.  IMPRESSION: Scattered areas of linear atelectasis scarring in both lungs with mild underlying emphysematous changes.  Nonspecific 4 mm LEFT lower lobe nodule, recommendation below.  Hepatomegaly and hepatic steatosis.  Few scattered pancreatic calcifications question chronic calcific pancreatitis.  If the patient is at high risk for bronchogenic carcinoma, follow-up chest CT at 1 year is recommended. If the patient is at low risk, no follow-up is needed. This recommendation follows the consensus statement: Guidelines for Management of Small Pulmonary Nodules Detected on CT Scans: A Statement from the Fleischner Society as published in Radiology 2005; 237:395-400.   Electronically Signed   By: Ulyses Southward M.D.   On: 04/15/2014 15:54   Nm Myocar Multi W/spect W/wall Motion / Ef  04/15/2014  CLINICAL DATA:  Chest pain  EXAM: MYOCARDIAL IMAGING WITH SPECT (REST AND PHARMACOLOGIC-STRESS)  GATED LEFT VENTRICULAR WALL MOTION STUDY  LEFT VENTRICULAR EJECTION FRACTION  TECHNIQUE: Standard myocardial SPECT imaging was performed after resting intravenous injection of 10 mCi Tc-5162m sestamibi. Subsequently, intravenous infusion of Lexiscan was performed under the supervision of the Cardiology staff. At peak effect of the drug, 30 mCi Tc-7462m sestamibi was injected intravenously and standard myocardial SPECT imaging was performed. Quantitative gated imaging was also performed to evaluate left ventricular wall motion, and estimate left ventricular ejection fraction.  COMPARISON:  None.  FINDINGS: Perfusion: There is no stress-induced perfusion defect which re- perfuses on rest imaging. There is fixed thinning of the inferolateral wall.  Wall Motion: Septal hypokinesis is nonspecific. There is otherwise normal wall motion.  Left Ventricular Ejection  Fraction: 62 %  End diastolic volume 76 ml  End systolic volume 29 ml  IMPRESSION: 1. There is fixed thinning of the inferolateral wall related to prior infarct or artifact.  2. Septal hypokinesis is nonspecific.  3. Left ventricular ejection fraction 62%  4. Low-risk stress test findings*.  *2012 Appropriate Use Criteria for Coronary Revascularization Focused Update: J Am Coll Cardiol. 2012;59(9):857-881. http://content.dementiazones.comonlinejacc.org/article.aspx?articleid=1201161   Electronically Signed   By: Maryclare BeanArt  Hoss M.D.   On: 04/15/2014 16:50   Dg Chest Port 1 View  04/13/2014   CLINICAL DATA:  Mid to LEFT side chest pain radiating around to back, shortness of breath and cough since yesterday, nausea, history smoking  EXAM: PORTABLE CHEST - 1 VIEW  COMPARISON:  Portable exam 2034 hr without priors for comparison  FINDINGS: Upper normal heart size.  Normal mediastinal contours and pulmonary vascularity.  Minimal LEFT basilar atelectasis.  Lungs otherwise clear.  No pleural effusion or pneumothorax.  No acute osseous findings.  IMPRESSION: Minimal LEFT basilar atelectasis.   Electronically Signed   By: Ulyses SouthwardMark  Boles M.D.   On: 04/13/2014 20:43    Microbiology: No results found for this or any previous visit (from the past 240 hour(s)).   Labs: Basic Metabolic Panel:  Recent Labs Lab 04/13/14 1521 04/15/14 1601 04/16/14 0510  NA 134* 135* 138  K 4.1 4.6 4.5  CL 92* 97 100  CO2 21 25 27   GLUCOSE 168* 110* 98  BUN 12 9 10   CREATININE 1.05 1.13 1.38*  CALCIUM 9.8 9.5 9.3   Liver Function Tests:  Recent Labs Lab 04/13/14 2252  AST 110*  ALT 127*  ALKPHOS 176*  BILITOT 0.7  PROT 8.2  ALBUMIN 3.9   No results for input(s): LIPASE, AMYLASE in the last 168 hours. No results for input(s): AMMONIA in the last 168 hours. CBC:  Recent Labs Lab 04/13/14 1521  WBC 11.2*  HGB 14.3  HCT 41.7  MCV 103.5*  PLT 200   Cardiac Enzymes:  Recent Labs Lab 04/13/14 2252 04/14/14 0201 04/14/14 0456   TROPONINI <0.30 <0.30 <0.30   BNP: BNP (last 3 results)  Recent Labs  04/13/14 1520 04/15/14 1601  PROBNP 127.3* 81.6   CBG: No results for input(s): GLUCAP in the last 168 hours.     Signed:  Penny PiaVEGA, Samarth Ogle  [facility] 04/16/2014, 3:09 PM

## 2014-04-16 NOTE — Progress Notes (Signed)
Went over all discharge information with pt., all questions answered.  VSS.  Pt. Alert and oriented.  Percocet given at 1418 pm.  Waited four hours from time given to discharge pt due to pt driving himself home.  Pt. States he feels able to drive himself home.  IV taken out, skin clean dry and intact.  Prescriptions and discharge information given to pt.  Pt. Wheeled out by NT.

## 2014-04-16 NOTE — Consult Note (Signed)
[facility] Face-to-Face Psychiatry Consult   Reason for Consult:  Depression and alcohol abuse Referring Physician:  Dr. Dorothy Puffer Walton Jefferson is an 58 y.o. male. Total Time spent with patient: 45 minutes  Assessment: AXIS I:  Substance Induced Mood Disorder and Alcohol dependence AXIS II:  Deferred AXIS III:   Past Medical History  Diagnosis Date  . Thyroid disease   . Neuromuscular disorder   . Depression   . Gout    AXIS IV:  other psychosocial or environmental problems, problems related to social environment and problems with primary support group AXIS V:  41-50 serious symptoms  Plan:  Supportive therapy provided about ongoing stressors.  Subjective:   Paul Jefferson is a 58 y.o. male patient admitted with chest pain and detox from EtOH.  HPI:  Paul Jefferson is a 58 y.o. male seen for psych consult for depression and alcohol dependence. Patient stated that he has been drinking his his teenage years, has been sober about five years prior to 2013. He has been drinking on and off since than and has recent increase of drinking daily for the last four months. Patient has multiple medical problems and seeking alcohol detox treatment at this time and also medication for depression. Patient has family history of alcohol and depression on his both sides of family. He stopped working about two years ago due to ankle pain and also reportedly has settle case against employer and received a large payment from [employer]. Patient denied drug of abuse and denied current suicide, homicide ideation, intention or plans. Patient is willing to participate in rehab treatment when completed alcohol detox treatment and medically cleared. He has completed stress test and results are pending. His BAL is less than 11 and his last use was two days ago and UDS is negative.   Medical History: Patient presents to the ED for evaluation of chest pain and wishes to detox from EtOH. With regard to chest  pain, he reports over 24 hours of intermittent, substernal chest pain with radiation to his back. Pain is a pressure like sensation, waxing and waning. Nothing makes it better or worse. Associated nausea and SOB. Prior cardiac cath in hospital 8 years ago. Regarding EtOH detox: He admits to being a very heavy drinker, fifth of liquor daily, wants to quit. He also reports current withdrawal symptoms including anxiety from EtOH, has had seizures and DTs in the past.  Review of Systems: Systems reviewed. As above, otherwise negative  HPI Elements:   Location:  substance abuse and depression. Quality:  poor. Severity:  acute to chronic. Timing:  unknown.  Past Psychiatric History: Past Medical History  Diagnosis Date  . Thyroid disease   . Neuromuscular disorder   . Depression   . Gout     reports that he has been smoking Cigarettes.  He has been smoking about 0.50 packs per day. He does not have any smokeless tobacco history on file. He reports that he does not drink alcohol or use illicit drugs. No family history on file.   Living Arrangements: Alone   Abuse/Neglect [facility]) Physical Abuse: Denies Verbal Abuse: Denies Sexual Abuse: Denies Allergies:   Allergies  Allergen Reactions  . Voltaren [Diclofenac Sodium] Other (See Comments)    Per patients states this medication caused him to feel nervous, dizziness, and light headed and he does not want to take it again.    ACT Assessment Complete:  No Objective: Blood pressure 115/70, pulse 75, temperature 98 F (36.7  C), temperature source Oral, resp. rate 18, height 6' 3"  (1.905 m), weight 126.962 kg (279 lb 14.4 oz), SpO2 95 %.Body mass index is 34.99 kg/(m^2). Results for orders placed or performed during the hospital encounter of 04/13/14 (from the past 72 hour(s))  BNP (order ONLY if patient complains of dyspnea/SOB AND you have documented it for THIS visit)     Status: Abnormal   Collection Time: 04/13/14  3:20 PM  Result  Value Ref Range   Pro B Natriuretic peptide (BNP) 127.3 (H) 0 - 125 pg/mL  Basic metabolic panel     Status: Abnormal   Collection Time: 04/13/14  3:21 PM  Result Value Ref Range   Sodium 134 (L) 137 - 147 mEq/L   Potassium 4.1 3.7 - 5.3 mEq/L   Chloride 92 (L) 96 - 112 mEq/L   CO2 21 19 - 32 mEq/L   Glucose, Bld 168 (H) 70 - 99 mg/dL   BUN 12 6 - 23 mg/dL   Creatinine, Ser 1.05 0.50 - 1.35 mg/dL   Calcium 9.8 8.4 - 10.5 mg/dL   GFR calc non Af Amer 76 (L) >90 mL/min   GFR calc Af Amer 88 (L) >90 mL/min    Comment: (NOTE) The eGFR has been calculated using the CKD EPI equation. This calculation has not been validated in all clinical situations. eGFR's persistently <90 mL/min signify possible Chronic Kidney Disease.    Anion gap 21 (H) 5 - 15  CBC     Status: Abnormal   Collection Time: 04/13/14  3:21 PM  Result Value Ref Range   WBC 11.2 (H) 4.0 - 10.5 K/uL   RBC 4.03 (L) 4.22 - 5.81 MIL/uL   Hemoglobin 14.3 13.0 - 17.0 g/dL   HCT 41.7 39.0 - 52.0 %   MCV 103.5 (H) 78.0 - 100.0 fL   MCH 35.5 (H) 26.0 - 34.0 pg   MCHC 34.3 30.0 - 36.0 g/dL   RDW 12.4 11.5 - 15.5 %   Platelets 200 150 - 400 K/uL  I-stat troponin, ED (not at [facility])     Status: None   Collection Time: 04/13/14  3:28 PM  Result Value Ref Range   Troponin i, poc 0.02 0.00 - 0.08 ng/mL   Comment 3            Comment: Due to the release kinetics of cTnI, a negative result within the first hours of the onset of symptoms does not rule out myocardial infarction with certainty. If myocardial infarction is still suspected, repeat the test at appropriate intervals.   Ethanol     Status: None   Collection Time: 04/13/14  8:56 PM  Result Value Ref Range   Alcohol, Ethyl (B) <11 0 - 11 mg/dL    Comment:        LOWEST DETECTABLE LIMIT FOR SERUM ALCOHOL IS 11 mg/dL FOR MEDICAL PURPOSES ONLY   I-stat troponin, ED     Status: None   Collection Time: 04/13/14  9:02 PM  Result Value Ref Range   Troponin i, poc 0.00  0.00 - 0.08 ng/mL   Comment 3            Comment: Due to the release kinetics of cTnI, a negative result within the first hours of the onset of symptoms does not rule out myocardial infarction with certainty. If myocardial infarction is still suspected, repeat the test at appropriate intervals.   Urine rapid drug screen (hosp performed)     Status: None  Collection Time: 04/13/14 10:46 PM  Result Value Ref Range   Opiates NONE DETECTED NONE DETECTED   Cocaine NONE DETECTED NONE DETECTED   Benzodiazepines NONE DETECTED NONE DETECTED   Amphetamines NONE DETECTED NONE DETECTED   Tetrahydrocannabinol NONE DETECTED NONE DETECTED   Barbiturates NONE DETECTED NONE DETECTED    Comment:        DRUG SCREEN FOR MEDICAL PURPOSES ONLY.  IF CONFIRMATION IS NEEDED FOR ANY PURPOSE, NOTIFY LAB WITHIN 5 DAYS.        LOWEST DETECTABLE LIMITS FOR URINE DRUG SCREEN Drug Class       Cutoff (ng/mL) Amphetamine      1000 Barbiturate      200 Benzodiazepine   703 Tricyclics       500 Opiates          300 Cocaine          300 THC              50   Troponin I-serum (0, 3, 6 hours)     Status: None   Collection Time: 04/13/14 10:52 PM  Result Value Ref Range   Troponin I <0.30 <0.30 ng/mL    Comment:        Due to the release kinetics of cTnI, a negative result within the first hours of the onset of symptoms does not rule out myocardial infarction with certainty. If myocardial infarction is still suspected, repeat the test at appropriate intervals.   Lactic acid, plasma     Status: Abnormal   Collection Time: 04/13/14 10:52 PM  Result Value Ref Range   Lactic Acid, Venous 2.5 (H) 0.5 - 2.2 mmol/L  Hepatic function panel     Status: Abnormal   Collection Time: 04/13/14 10:52 PM  Result Value Ref Range   Total Protein 8.2 6.0 - 8.3 g/dL   Albumin 3.9 3.5 - 5.2 g/dL   AST 110 (H) 0 - 37 U/L   ALT 127 (H) 0 - 53 U/L   Alkaline Phosphatase 176 (H) 39 - 117 U/L   Total Bilirubin 0.7 0.3 -  1.2 mg/dL   Bilirubin, Direct 0.2 0.0 - 0.3 mg/dL   Indirect Bilirubin 0.5 0.3 - 0.9 mg/dL  Lipid panel     Status: Abnormal   Collection Time: 04/13/14 10:52 PM  Result Value Ref Range   Cholesterol 216 (H) 0 - 200 mg/dL   Triglycerides 342 (H) <150 mg/dL   HDL 40 >39 mg/dL   Total CHOL/HDL Ratio 5.4 RATIO   VLDL 68 (H) 0 - 40 mg/dL   LDL Cholesterol 108 (H) 0 - 99 mg/dL    Comment:        Total Cholesterol/HDL:CHD Risk Coronary Heart Disease Risk Table                     Men   Women  1/2 Average Risk   3.4   3.3  Average Risk       5.0   4.4  2 X Average Risk   9.6   7.1  3 X Average Risk  23.4   11.0        Use the calculated Patient Ratio above and the CHD Risk Table to determine the patient's CHD Risk.        ATP III CLASSIFICATION (LDL):  <100     mg/dL   Optimal  100-129  mg/dL   Near or Above  Optimal  130-159  mg/dL   Borderline  160-189  mg/dL   High  >190     mg/dL   Very High Performed at [facility]   Troponin I-serum (0, 3, 6 hours)     Status: None   Collection Time: 04/14/14  2:01 AM  Result Value Ref Range   Troponin I <0.30 <0.30 ng/mL    Comment:        Due to the release kinetics of cTnI, a negative result within the first hours of the onset of symptoms does not rule out myocardial infarction with certainty. If myocardial infarction is still suspected, repeat the test at appropriate intervals.   Troponin I-serum (0, 3, 6 hours)     Status: None   Collection Time: 04/14/14  4:56 AM  Result Value Ref Range   Troponin I <0.30 <0.30 ng/mL    Comment:        Due to the release kinetics of cTnI, a negative result within the first hours of the onset of symptoms does not rule out myocardial infarction with certainty. If myocardial infarction is still suspected, repeat the test at appropriate intervals.   Pro b natriuretic peptide     Status: None   Collection Time: 04/15/14  4:01 PM  Result Value Ref Range   Pro B  Natriuretic peptide (BNP) 81.6 0 - 125 pg/mL  Basic metabolic panel     Status: Abnormal   Collection Time: 04/15/14  4:01 PM  Result Value Ref Range   Sodium 135 (L) 137 - 147 mEq/L   Potassium 4.6 3.7 - 5.3 mEq/L   Chloride 97 96 - 112 mEq/L   CO2 25 19 - 32 mEq/L   Glucose, Bld 110 (H) 70 - 99 mg/dL   BUN 9 6 - 23 mg/dL   Creatinine, Ser 1.13 0.50 - 1.35 mg/dL   Calcium 9.5 8.4 - 10.5 mg/dL   GFR calc non Af Amer 69 (L) >90 mL/min   GFR calc Af Amer 80 (L) >90 mL/min    Comment: (NOTE) The eGFR has been calculated using the CKD EPI equation. This calculation has not been validated in all clinical situations. eGFR's persistently <90 mL/min signify possible Chronic Kidney Disease.    Anion gap 13 5 - 15  Basic metabolic panel     Status: Abnormal   Collection Time: 04/16/14  5:10 AM  Result Value Ref Range   Sodium 138 137 - 147 mEq/L   Potassium 4.5 3.7 - 5.3 mEq/L   Chloride 100 96 - 112 mEq/L   CO2 27 19 - 32 mEq/L   Glucose, Bld 98 70 - 99 mg/dL   BUN 10 6 - 23 mg/dL   Creatinine, Ser 1.38 (H) 0.50 - 1.35 mg/dL   Calcium 9.3 8.4 - 10.5 mg/dL   GFR calc non Af Amer 54 (L) >90 mL/min   GFR calc Af Amer 63 (L) >90 mL/min    Comment: (NOTE) The eGFR has been calculated using the CKD EPI equation. This calculation has not been validated in all clinical situations. eGFR's persistently <90 mL/min signify possible Chronic Kidney Disease.    Anion gap 11 5 - 15   Labs are reviewed and are pertinent for elevated LFT's.  Current Facility-Administered Medications  Medication Dose Route Frequency Provider Last Rate Last Dose  . acetaminophen (TYLENOL) tablet 650 mg  650 mg Oral Q4H PRN Etta Quill, DO      . albuterol (PROVENTIL) (2.5 MG/3ML) 0.083% nebulizer  solution 2.5 mg  2.5 mg Nebulization Q6H PRN Velvet Bathe, MD   2.5 mg at 04/14/14 1941  . aspirin EC tablet 325 mg  325 mg Oral Daily Etta Quill, DO   325 mg at 04/15/14 1431  . colchicine tablet 0.6 mg  0.6 mg  Oral Daily PRN Etta Quill, DO      . heparin injection 5,000 Units  5,000 Units Subcutaneous 3 times per day Etta Quill, DO   5,000 Units at 04/16/14 0536  . ipratropium-albuterol (DUONEB) 0.5-2.5 (3) MG/3ML nebulizer solution 3 mL  3 mL Nebulization BID Velvet Bathe, MD   3 mL at 04/15/14 2004  . levothyroxine (SYNTHROID, LEVOTHROID) tablet 125 mcg  125 mcg Oral QAC breakfast Etta Quill, DO   125 mcg at 04/14/14 8453  . LORazepam (ATIVAN) injection 0-4 mg  0-4 mg Intravenous Q12H Etta Quill, DO   1 mg at 04/16/14 0001  . LORazepam (ATIVAN) tablet 1 mg  1 mg Oral Q6H PRN Etta Quill, DO   1 mg at 04/14/14 1349   Or  . LORazepam (ATIVAN) injection 1 mg  1 mg Intravenous Q6H PRN Etta Quill, DO   1 mg at 04/16/14 0541  . mometasone-formoterol (DULERA) 100-5 MCG/ACT inhaler 2 puff  2 puff Inhalation BID Gardiner Barefoot, NP   2 puff at 04/15/14 2004  . ondansetron (ZOFRAN) injection 4 mg  4 mg Intravenous Q6H PRN Etta Quill, DO   4 mg at 04/14/14 0026  . oxyCODONE-acetaminophen (PERCOCET/ROXICET) 5-325 MG per tablet 1-2 tablet  1-2 tablet Oral Q4H PRN Etta Quill, DO   2 tablet at 04/16/14 0302  . QUEtiapine (SEROQUEL XR) 24 hr tablet 100 mg  100 mg Oral QHS Etta Quill, DO   100 mg at 04/15/14 2108  . thiamine (VITAMIN B-1) tablet 100 mg  100 mg Oral Daily Minda Ditto, RPH   100 mg at 04/15/14 1431   Or  . thiamine (B-1) injection 100 mg  100 mg Intravenous Daily Minda Ditto, [facility]        Psychiatric Specialty Exam: Physical Exam as per history and physical  ROS hand tremors and depression  Blood pressure 115/70, pulse 75, temperature 98 F (36.7 C), temperature source Oral, resp. rate 18, height 6' 3"  (1.905 m), weight 126.962 kg (279 lb 14.4 oz), SpO2 95 %.Body mass index is 34.99 kg/(m^2).  General Appearance: Casual  Eye Contact::  Good  Speech:  Clear and Coherent  Volume:  Normal  Mood:  Anxious and Depressed  Affect:  Congruent and  Depressed  Thought Process:  Coherent and Goal Directed  Orientation:  Full (Time, Place, and Person)  Thought Content:  WDL  Suicidal Thoughts:  No  Homicidal Thoughts:  No  Memory:  Immediate;   Good Recent;   Good  Judgement:  Intact  Insight:  Lacking  Psychomotor Activity:  Decreased, Restlessness and Tremor  Concentration:  Good  Recall:  Good  Fund of Knowledge:Good  Language: Good  Akathisia:  NA  Handed:  Right  AIMS (if indicated):     Assets:  Communication Skills Desire for Improvement Housing Leisure Time Physical Health Resilience  Sleep:      Musculoskeletal: Strength & Muscle Tone: within normal limits Gait & Station: normal Patient leans: N/A  Treatment Plan Summary: Daily contact with patient to assess and evaluate symptoms and progress in treatment Medication management Continue Ativan alcohol detox protocol and CIWA  and supportive therapy Start Fluoxetine 20 mg PO QD for depression  Continue Seroquel 100 mg Qhs for hallucinations and insomnia  Sharalyn Lomba,JANARDHAHA R. 04/16/2014 7:53 AM

## 2014-04-16 NOTE — Progress Notes (Signed)
Clinical Social Work Department CLINICAL SOCIAL WORK PSYCHIATRY SERVICE LINE ASSESSMENT 04/16/2014  Patient:  Paul Jefferson  Account:  [id number]  [place] Date:  04/13/2014  Clinical Social Worker:  Sindy Messing, LCSW  Date/Time:  04/16/2014 12:00 N Referred by:  Physician  Date referred:  04/16/2014 Reason for Referral  Psychosocial assessment   Presenting Symptoms/Problems (In the person's/family's own words):   Psych consulted due to substance abuse and depression.   Abuse/Neglect/Trauma History (check all that apply)  Denies history   Abuse/Neglect/Trauma Comments:   Psychiatric History (check all that apply)  Outpatient treatment  Residential treatment   Psychiatric medications:  Prozac 20 mg  Seroquel 100 mg  Ativan 0-4 mg   Current Mental Health Hospitalizations/Previous Mental Health History:   Patient reports he was diagnosed with depression several years ago. Patient has been going to [facility] for medication management for the past 5 years.   Current provider:   Jodene Nam and Date:   [place], [place]   Current Medications:   Scheduled Meds:      . aspirin EC  325 mg Oral Daily  . FLUoxetine  20 mg Oral Daily  . heparin  5,000 Units Subcutaneous 3 times per day  . ipratropium-albuterol  3 mL Nebulization BID  . levothyroxine  125 mcg Oral QAC breakfast  . LORazepam  0-4 mg Intravenous Q12H  . mometasone-formoterol  2 puff Inhalation BID  . QUEtiapine  100 mg Oral QHS  . thiamine  100 mg Oral Daily   Or     . thiamine  100 mg Intravenous Daily        Continuous Infusions:      PRN Meds:.acetaminophen, albuterol, colchicine, LORazepam **OR** LORazepam, ondansetron (ZOFRAN) IV, oxyCODONE-acetaminophen       Previous Impatient Admission/Date/Reason:   Patient went to [facility] in the past for SA treatment.   Emotional Health / Current Symptoms    Suicide/Self Harm  None reported   Suicide attempt in the past:   Patient denies any SI or HI.  Patient denies any previous attempts.   Other harmful behavior:   None reported   Psychotic/Dissociative Symptoms  Auditory Hallucinations   Other Psychotic/Dissociative Symptoms:   Patient reports he was having AH when withdrawing from alcohol but denies any psychotic symptoms when sober.    Attention/Behavioral Symptoms  Within Normal Limits   Other Attention / Behavioral Symptoms:   Patient engaged in assessment.    Cognitive Impairment  Within Normal Limits   Other Cognitive Impairment:   Patient alert and oriented.    Mood and Adjustment  Flat    Stress, Anxiety, Trauma, Any Recent Loss/Stressor  Other - See comment   Anxiety (frequency):   N/A   Phobia (specify):   N/A   Compulsive behavior (specify):   N/A   Obsessive behavior (specify):   N/A   Other:   Patient is unemployed and seeking a job or to apply for disability.   Substance Abuse/Use  Current substance use   SBIRT completed (please refer for detailed history):  Y  Self-reported substance use:   Patient reports he has been drinking 1/5 of liquor on daily basis. Patient reports he was sober but started drinking after depression medication was changed. Patient reports a long history of substance use but did have about 5 years sobriety after attending treatment at [facility]. Patient is interested in treatment again to stay sober.   Urinary Drug Screen Completed:  Y Alcohol level:   <  11    Environmental/Housing/Living Arrangement  Stable housing   Who is in the home:   Alone   Emergency contact:  [place]   Patient's Strengths and Goals (patient's own words):   Patient reports supportive family.   Clinical Social Worker's Interpretive Summary:   CSW received referral in order to complete psychosocial assessment. CSW reviewed chart and met with patient at bedside. CSW introduced myself and explained role.    Patient reports he lives alone and has never  been married or have children. Patient reports that his father passed away about 5 years ago and it was a trigger to drinking alcohol again. Patient still has strong relationships with mother and siblings but has not shared how much alcohol he consumes with them. Patient was working but got injured on the job and is currently using money that he received from [employer] comp to pay bills. Patient was interested in job resources so CSW referred [facility] since he has been there in the past. Patient also inquired about applying for disability so CSW encouraged patient to follow up with [employer].    Patient reports he has received alcohol treatment in the past but feels he needs treatment again. CSW reviewed psych MD note and spoke with patient about resources. Patient reports he is not interested in residential treatment at this time because he wants to be available for his mother. CSW and patient discussed high SBIRT score and CSW provided information for residential treatment in case patient changed his mind. Patient reports that he has never tried Intensive Outpatient Program (IOP) but feels it might be beneficial. CSW provided information for IOP programs and scholarship information form for [facility] since patient is not insured. CSW also provided AA meeting list and encouraged patient to connect with [place] meetings as soon as discharge occurs to have additional support.    Patient engaged and thanked CSW for information. Patient reports he is motivated to stay sober and knows that he needs to take better care of his health. Patient denies any SI or HI and plans to continue to follow up at [facility] to manage his depression.    CSW will continue to follow to provide support during hospital stay.   Disposition:  Outpatient referral made/needed   [place], [place] [phone]

## 2014-05-06 ENCOUNTER — Ambulatory Visit: Payer: Self-pay | Attending: Internal Medicine | Admitting: Internal Medicine

## 2014-05-06 ENCOUNTER — Encounter: Payer: Self-pay | Admitting: Internal Medicine

## 2014-05-06 VITALS — BP 114/77 | HR 85 | Temp 98.0°F | Resp 16 | Ht 75.0 in | Wt 275.0 lb

## 2014-05-06 DIAGNOSIS — F419 Anxiety disorder, unspecified: Secondary | ICD-10-CM | POA: Insufficient documentation

## 2014-05-06 DIAGNOSIS — Z7951 Long term (current) use of inhaled steroids: Secondary | ICD-10-CM | POA: Insufficient documentation

## 2014-05-06 DIAGNOSIS — F10239 Alcohol dependence with withdrawal, unspecified: Secondary | ICD-10-CM | POA: Insufficient documentation

## 2014-05-06 DIAGNOSIS — M25571 Pain in right ankle and joints of right foot: Secondary | ICD-10-CM

## 2014-05-06 DIAGNOSIS — E785 Hyperlipidemia, unspecified: Secondary | ICD-10-CM

## 2014-05-06 DIAGNOSIS — E039 Hypothyroidism, unspecified: Secondary | ICD-10-CM | POA: Insufficient documentation

## 2014-05-06 DIAGNOSIS — E038 Other specified hypothyroidism: Secondary | ICD-10-CM

## 2014-05-06 DIAGNOSIS — Z1211 Encounter for screening for malignant neoplasm of colon: Secondary | ICD-10-CM

## 2014-05-06 DIAGNOSIS — F17210 Nicotine dependence, cigarettes, uncomplicated: Secondary | ICD-10-CM | POA: Insufficient documentation

## 2014-05-06 DIAGNOSIS — M109 Gout, unspecified: Secondary | ICD-10-CM | POA: Insufficient documentation

## 2014-05-06 DIAGNOSIS — Z7982 Long term (current) use of aspirin: Secondary | ICD-10-CM | POA: Insufficient documentation

## 2014-05-06 DIAGNOSIS — F329 Major depressive disorder, single episode, unspecified: Secondary | ICD-10-CM | POA: Insufficient documentation

## 2014-05-06 LAB — CBC WITH DIFFERENTIAL/PLATELET
BASOS ABS: 0.1 10*3/uL (ref 0.0–0.1)
Basophils Relative: 1 % (ref 0–1)
Eosinophils Absolute: 0.5 10*3/uL (ref 0.0–0.7)
Eosinophils Relative: 4 % (ref 0–5)
HEMATOCRIT: 42.9 % (ref 39.0–52.0)
HEMOGLOBIN: 15.3 g/dL (ref 13.0–17.0)
Lymphocytes Relative: 26 % (ref 12–46)
Lymphs Abs: 3.5 10*3/uL (ref 0.7–4.0)
MCH: 34.5 pg — ABNORMAL HIGH (ref 26.0–34.0)
MCHC: 35.7 g/dL (ref 30.0–36.0)
MCV: 96.8 fL (ref 78.0–100.0)
MPV: 11.8 fL (ref 9.4–12.4)
Monocytes Absolute: 0.9 10*3/uL (ref 0.1–1.0)
Monocytes Relative: 7 % (ref 3–12)
NEUTROS ABS: 8.4 10*3/uL — AB (ref 1.7–7.7)
Neutrophils Relative %: 62 % (ref 43–77)
PLATELETS: 336 10*3/uL (ref 150–400)
RBC: 4.43 MIL/uL (ref 4.22–5.81)
RDW: 12.9 % (ref 11.5–15.5)
WBC: 13.5 10*3/uL — AB (ref 4.0–10.5)

## 2014-05-06 LAB — COMPLETE METABOLIC PANEL WITH GFR
ALBUMIN: 4.1 g/dL (ref 3.5–5.2)
ALT: 53 U/L (ref 0–53)
AST: 55 U/L — AB (ref 0–37)
Alkaline Phosphatase: 190 U/L — ABNORMAL HIGH (ref 39–117)
BUN: 18 mg/dL (ref 6–23)
CALCIUM: 9.9 mg/dL (ref 8.4–10.5)
CHLORIDE: 102 meq/L (ref 96–112)
CO2: 22 meq/L (ref 19–32)
Creat: 1.05 mg/dL (ref 0.50–1.35)
GFR, EST AFRICAN AMERICAN: 89 mL/min
GFR, Est Non African American: 77 mL/min
Glucose, Bld: 92 mg/dL (ref 70–99)
POTASSIUM: 4.8 meq/L (ref 3.5–5.3)
Sodium: 135 mEq/L (ref 135–145)
Total Bilirubin: 0.3 mg/dL (ref 0.2–1.2)
Total Protein: 8 g/dL (ref 6.0–8.3)

## 2014-05-06 LAB — T4, FREE: Free T4: 0.78 ng/dL — ABNORMAL LOW (ref 0.80–1.80)

## 2014-05-06 LAB — TSH: TSH: 117.678 u[IU]/mL — ABNORMAL HIGH (ref 0.350–4.500)

## 2014-05-06 LAB — POCT GLYCOSYLATED HEMOGLOBIN (HGB A1C): HEMOGLOBIN A1C: 5.2

## 2014-05-06 MED ORDER — ACETAMINOPHEN-CODEINE #3 300-30 MG PO TABS: 1.0000 | ORAL_TABLET | Freq: Three times a day (TID) | ORAL | Status: DC | PRN

## 2014-05-06 MED ORDER — ROSUVASTATIN CALCIUM 20 MG PO TABS: 20.0000 mg | ORAL_TABLET | Freq: Every day | ORAL | Status: DC

## 2014-05-06 NOTE — Progress Notes (Signed)
HFU Pt has a history of hypothyroidism, HTN, deression and GOUT. Pt states that he has pain in his left foot and left ankle.  Pt reports that he feels that he is having arthritic changes.

## 2014-05-06 NOTE — Patient Instructions (Signed)
Hypothyroidism The thyroid is a large gland located in the lower front of your neck. The thyroid gland helps control metabolism. Metabolism is how your body handles food. It controls metabolism with the hormone thyroxine. When this gland is underactive (hypothyroid), it produces too little hormone.  CAUSES These include:   Absence or destruction of thyroid tissue.  Goiter due to iodine deficiency.  Goiter due to medications.  Congenital defects (since birth).  Problems with the pituitary. This causes a lack of TSH (thyroid stimulating hormone). This hormone tells the thyroid to turn out more hormone. SYMPTOMS  Lethargy (feeling as though you have no energy)  Cold intolerance  Weight gain (in spite of normal food intake)  Dry skin  Coarse hair  Menstrual irregularity (if severe, may lead to infertility)  Slowing of thought processes Cardiac problems are also caused by insufficient amounts of thyroid hormone. Hypothyroidism in the newborn is cretinism, and is an extreme form. It is important that this form be treated adequately and immediately or it will lead rapidly to retarded physical and mental development. DIAGNOSIS  To prove hypothyroidism, your caregiver may do blood tests and ultrasound tests. Sometimes the signs are hidden. It may be necessary for your caregiver to watch this illness with blood tests either before or after diagnosis and treatment. TREATMENT  Low levels of thyroid hormone are increased by using synthetic thyroid hormone. This is a safe, effective treatment. It usually takes about four weeks to gain the full effects of the medication. After you have the full effect of the medication, it will generally take another four weeks for problems to leave. Your caregiver may start you on low doses. If you have had heart problems the dose may be gradually increased. It is generally not an emergency to get rapidly to normal. HOME CARE INSTRUCTIONS   Take your  medications as your caregiver suggests. Let your caregiver know of any medications you are taking or start taking. Your caregiver will help you with dosage schedules.  As your condition improves, your dosage needs may increase. It will be necessary to have continuing blood tests as suggested by your caregiver.  Report all suspected medication side effects to your caregiver. SEEK MEDICAL CARE IF: Seek medical care if you develop:  Sweating.  Tremulousness (tremors).  Anxiety.  Rapid weight loss.  Heat intolerance.  Emotional swings.  Diarrhea.  Weakness. SEEK IMMEDIATE MEDICAL CARE IF:  You develop chest pain, an irregular heart beat (palpitations), or a rapid heart beat. MAKE SURE YOU:   Understand these instructions.  Will watch your condition.  Will get help right away if you are not doing well or get worse. Document Released: 05/21/2005 Document Revised: 08/13/2011 Document Reviewed: 01/09/2008 [facility] Patient Information 2015 [place], [place]. This information is not intended to replace advice given to you by your health care provider. Make sure you discuss any questions you have with your health care provider. Ankle Pain Ankle pain is a common symptom. The bones, cartilage, tendons, and muscles of the ankle joint perform a lot of work each day. The ankle joint holds your body weight and allows you to move around. Ankle pain can occur on either side or back of 1 or both ankles. Ankle pain may be sharp and burning or dull and aching. There may be tenderness, stiffness, redness, or warmth around the ankle. The pain occurs more often when a person walks or puts pressure on the ankle. CAUSES  There are many reasons ankle pain can develop. It  is important to work with your caregiver to identify the cause since many conditions can impact the bones, cartilage, muscles, and tendons. Causes for ankle pain include:  Injury, including a break (fracture), sprain, or strain often due to  a fall, sports, or a high-impact activity.  Swelling (inflammation) of a tendon (tendonitis).  Achilles tendon rupture.  Ankle instability after repeated sprains and strains.  Poor foot alignment.  Pressure on a nerve (tarsal tunnel syndrome).  Arthritis in the ankle or the lining of the ankle.  Crystal formation in the ankle (gout or pseudogout). DIAGNOSIS  A diagnosis is based on your medical history, your symptoms, results of your physical exam, and results of diagnostic tests. Diagnostic tests may include X-ray exams or a computerized magnetic scan (magnetic resonance imaging, MRI). TREATMENT  Treatment will depend on the cause of your ankle pain and may include:  Keeping pressure off the ankle and limiting activities.  Using crutches or other walking support (a cane or brace).  Using rest, ice, compression, and elevation.  Participating in physical therapy or home exercises.  Wearing shoe inserts or special shoes.  Losing weight.  Taking medications to reduce pain or swelling or receiving an injection.  Undergoing surgery. HOME CARE INSTRUCTIONS   Only take over-the-counter or prescription medicines for pain, discomfort, or fever as directed by your caregiver.  Put ice on the injured area.  Put ice in a plastic bag.  Place a towel between your skin and the bag.  Leave the ice on for 15-20 minutes at a time, 03-04 times a day.  Keep your leg raised (elevated) when possible to lessen swelling.  Avoid activities that cause ankle pain.  Follow specific exercises as directed by your caregiver.  Record how often you have ankle pain, the location of the pain, and what it feels like. This information may be helpful to you and your caregiver.  Ask your caregiver about returning to work or sports and whether you should drive.  Follow up with your caregiver for further examination, therapy, or testing as directed. SEEK MEDICAL CARE IF:   Pain or swelling  continues or worsens beyond 1 week.  You have an oral temperature above 102 F (38.9 C).  You are feeling unwell or have chills.  You are having an increasingly difficult time with walking.  You have loss of sensation or other new symptoms.  You have questions or concerns. MAKE SURE YOU:   Understand these instructions.  Will watch your condition.  Will get help right away if you are not doing well or get worse. Document Released: 11/08/2009 Document Revised: 08/13/2011 Document Reviewed: 11/08/2009 [facility]ExitCare Patient Information 2015 [place], [place]. This information is not intended to replace advice given to you by your health care provider. Make sure you discuss any questions you have with your health care provider.

## 2014-05-06 NOTE — Progress Notes (Signed)
Patient ID: Paul CapersJames Mark Foucher, male   DOB: [birth year], 58 y.o.   MRN: [id number]   Marshell GarfinkelJames Shepherd, is a 58 y.o. male  [id number]  [id number]  DOB - [birth year]  CC:  Chief Complaint  Patient presents with  . Hospitalization Follow-up  . Establish Care       HPI: Marshell GarfinkelJames Symonette is a 58 y.o. male here today to establish medical care. Pt has a history of hypothyroidism, HTN, deression and GOUT. Patient was recently seen and admitted to the hospital from the ED for chest pain and not so detox from alcohol abuse. Patient agreed to having withdrawal symptoms including anxiety from alcohol, he has had seizures and DTs in the past. Patient currently smokes about half a pack of cigarettes per day, he denies the use of illicit drugs. In the hospital, MI was ruled out with negative troponins and negative CT chest ruled out pulmonary embolism. Patient was diagnosed with suspected anxiety. He was given Librium for alcohol withdrawal. He is here today as hospital follow-up. Pt states that he has pain in his left foot and left ankle. Pt reports that he feels that he is having arthritic changes. Patient has No headache, No chest pain, No abdominal pain - No Nausea, No new weakness tingling or numbness, No Cough - SOB. Patient has been started on paroxetine for depression and anxiety.  Allergies  Allergen Reactions  . Voltaren [Diclofenac Sodium] Other (See Comments)    Per patients states this medication caused him to feel nervous, dizziness, and light headed and he does not want to take it again.   Past Medical History  Diagnosis Date  . Thyroid disease   . Neuromuscular disorder   . Depression   . Gout    Current Outpatient Prescriptions on File Prior to Visit  Medication Sig Dispense Refill  . aspirin EC 81 MG tablet Take 1 tablet (81 mg total) by mouth daily. 30 tablet 0  . clonazePAM (KLONOPIN) 1 MG tablet Take 1 mg by mouth 2 (two) times daily.    . colchicine 0.6 MG tablet Take 0.6 mg by  mouth daily as needed (gout flare up).    Marland Kitchen. levothyroxine (SYNTHROID, LEVOTHROID) 125 MCG tablet Take 125 mcg by mouth daily before breakfast.     . QUEtiapine (SEROQUEL XR) 50 MG TB24 24 hr tablet Take 100 mg by mouth at bedtime.    . chlordiazePOXIDE (LIBRIUM) 25 MG capsule Take 1 capsule (25 mg total) by mouth 3 (three) times daily as needed for anxiety. (Patient not taking: Reported on 05/06/2014) 30 capsule 0  . fluticasone-salmeterol (ADVAIR HFA) 115-21 MCG/ACT inhaler Inhale 2 puffs into the lungs 2 (two) times daily.     No current facility-administered medications on file prior to visit.   History reviewed. No pertinent family history. History   Social History  . Marital Status: Single    Spouse Name: N/A    Number of Children: N/A  . Years of Education: N/A   Occupational History  . Not on file.   Social History Main Topics  . Smoking status: Current Every Day Smoker -- 0.50 packs/day    Types: Cigarettes  . Smokeless tobacco: Not on file  . Alcohol Use: No  . Drug Use: No  . Sexual Activity: No   Other Topics Concern  . Not on file   Social History Narrative    Review of Systems: Constitutional: Negative for fever, chills, diaphoresis, activity change, appetite change and fatigue. HENT: Negative for ear  pain, nosebleeds, congestion, facial swelling, rhinorrhea, neck pain, neck stiffness and ear discharge.  Eyes: Negative for pain, discharge, redness, itching and visual disturbance. Respiratory: Negative for cough, choking, chest tightness, shortness of breath, wheezing and stridor.  Cardiovascular: Negative for chest pain, palpitations and leg swelling. Gastrointestinal: Negative for abdominal distention. Genitourinary: Negative for dysuria, urgency, frequency, hematuria, flank pain, decreased urine volume, difficulty urinating and dyspareunia.  Musculoskeletal: Negative for back pain, joint swelling, arthralgia and gait problem. Neurological: Negative for  dizziness, tremors, seizures, syncope, facial asymmetry, speech difficulty, weakness, light-headedness, numbness and headaches.  Hematological: Negative for adenopathy. Does not bruise/bleed easily. Psychiatric/Behavioral: Negative for hallucinations, behavioral problems, confusion, dysphoric mood, decreased concentration and agitation.    Objective:   Filed Vitals:   05/06/14 1503  BP: 114/77  Pulse: 85  Temp: 98 F (36.7 C)  Resp: 16    Physical Exam: Constitutional: Patient appears well-developed and well-nourished. No distress. Morbidly obese. HENT: Normocephalic, atraumatic, External right and left ear normal. Oropharynx is clear and moist.  Eyes: Conjunctivae and EOM are normal. PERRLA, no scleral icterus. Neck: Normal ROM. Neck supple. No JVD. No tracheal deviation. No thyromegaly. CVS: RRR, S1/S2 +, no murmurs, no gallops, no carotid bruit.  Pulmonary: Effort and breath sounds normal, no stridor, rhonchi, wheezes, rales.  Abdominal: Soft. BS +, no distension, tenderness, rebound or guarding.  Musculoskeletal: Normal range of motion. No edema and no tenderness.  Lymphadenopathy: No lymphadenopathy noted, cervical, inguinal or axillary Neuro: Alert. Normal reflexes, muscle tone coordination. No cranial nerve deficit. Skin: Skin is warm and dry. No rash noted. Not diaphoretic. No erythema. No pallor. Psychiatric: Normal mood and affect. Behavior, judgment, thought content normal.  Lab Results  Component Value Date   WBC 11.2* 04/13/2014   HGB 14.3 04/13/2014   HCT 41.7 04/13/2014   MCV 103.5* 04/13/2014   PLT 200 04/13/2014   Lab Results  Component Value Date   CREATININE 1.38* 04/16/2014   BUN 10 04/16/2014   NA 138 04/16/2014   K 4.5 04/16/2014   CL 100 04/16/2014   CO2 27 04/16/2014    No results found for: HGBA1C Lipid Panel     Component Value Date/Time   CHOL 216* 04/13/2014 2252   TRIG 342* 04/13/2014 2252   HDL 40 04/13/2014 2252   CHOLHDL 5.4  04/13/2014 2252   VLDL 68* 04/13/2014 2252   LDLCALC 108* 04/13/2014 2252       Assessment and plan:   1. Right ankle pain  - acetaminophen-codeine (TYLENOL #3) 300-30 MG per tablet; Take 1 tablet by mouth every 8 (eight) hours as needed for moderate pain.  Dispense: 90 tablet; Refill: 0  2. Dyslipidemia  - rosuvastatin (CRESTOR) 20 MG tablet; Take 1 tablet (20 mg total) by mouth daily.  Dispense: 90 tablet; Refill: 3  3. Morbid obesity  - CBC with Differential - COMPLETE METABOLIC PANEL WITH GFR, to evaluate resolution of acute kidney injury while in the hospital. - POCT glycosylated hemoglobin (Hb A1C)  4. Other specified hypothyroidism  - TSH - T4, Free  5. Colon cancer screening  - HM COLONOSCOPY - Ambulatory referral to Gastroenterology  Patient was extensively counseled on nutrition and exercise Patient was extensively counseled on smoking cessation.  Return in about 6 months (around 11/05/2014) for Follow up Pain and comorbidities, Annual Physical.  The patient was given clear instructions to go to ER or return to medical center if symptoms don't improve, worsen or new problems develop. The patient verbalized understanding. The  patient was told to call to get lab results if they haven't heard anything in the next week.     This note has been created with Education officer, environmental. Any transcriptional errors are unintentional.    Jeanann Lewandowsky, MD, MHA, FACP, FAAP [facility] [place], [place] [phone]   05/06/2014, 3:59 PM

## 2014-05-07 LAB — HEPATITIS PANEL, ACUTE
HCV Ab: NEGATIVE
HEP B C IGM: NONREACTIVE
Hep A IgM: NONREACTIVE
Hepatitis B Surface Ag: NEGATIVE

## 2014-05-11 ENCOUNTER — Ambulatory Visit: Payer: Self-pay | Attending: Internal Medicine

## 2014-05-20 ENCOUNTER — Other Ambulatory Visit: Payer: Self-pay | Admitting: Internal Medicine

## 2014-05-20 DIAGNOSIS — E785 Hyperlipidemia, unspecified: Secondary | ICD-10-CM

## 2014-05-20 MED ORDER — ROSUVASTATIN CALCIUM 20 MG PO TABS: 20.0000 mg | ORAL_TABLET | Freq: Every day | ORAL | Status: DC

## 2014-05-25 ENCOUNTER — Telehealth: Payer: Self-pay | Admitting: Emergency Medicine

## 2014-05-25 MED ORDER — LEVOTHYROXINE SODIUM 175 MCG PO TABS: 175.0000 ug | ORAL_TABLET | Freq: Every day | ORAL | Status: DC

## 2014-05-25 NOTE — Telephone Encounter (Signed)
Pt given lab results with instructions to start taking Levothyroxine 175 mcg daily for to improve TSH levels. Medication e-scribed to pharmacy

## 2014-05-25 NOTE — Telephone Encounter (Signed)
-----   Message from Quentin Angstlugbemiga E Jegede, MD sent at 05/14/2014  5:34 PM EST ----- Please inform patient that his hepatitis panel is negative. His liver functions show slight increase in liver enzymes, and his TSH is very high which means his thyroid function is low and it means that we'll need to increase the dose of his thyroid medication from 125 g to 175 g. All other results are mostly within normal limit.  Please call a new prescription for levothyroxine 175 g tablet by mouth daily, 30 tablets with 3 refills.

## 2014-05-26 ENCOUNTER — Telehealth: Payer: Self-pay | Admitting: Emergency Medicine

## 2014-05-26 NOTE — Telephone Encounter (Signed)
Pt given lab results with instructions to start taking prescribed Levothyroxine 175 mcg daily  Medication e-scribed to [facility] pharmacy

## 2014-06-05 ENCOUNTER — Emergency Department (INDEPENDENT_AMBULATORY_CARE_PROVIDER_SITE_OTHER)
Admission: EM | Admit: 2014-06-05 | Discharge: 2014-06-05 | Disposition: A | Discharge status: Home / Self Care | Payer: Self-pay | Source: Home / Self Care | Attending: Family Medicine | Admitting: Family Medicine

## 2014-06-05 ENCOUNTER — Ambulatory Visit (HOSPITAL_COMMUNITY): Payer: Self-pay | Attending: Family Medicine

## 2014-06-05 ENCOUNTER — Encounter (HOSPITAL_COMMUNITY): Payer: Self-pay | Admitting: Emergency Medicine

## 2014-06-05 DIAGNOSIS — R05 Cough: Secondary | ICD-10-CM | POA: Insufficient documentation

## 2014-06-05 DIAGNOSIS — J4541 Moderate persistent asthma with (acute) exacerbation: Secondary | ICD-10-CM

## 2014-06-05 DIAGNOSIS — Z720 Tobacco use: Secondary | ICD-10-CM | POA: Insufficient documentation

## 2014-06-05 MED ORDER — METHYLPREDNISOLONE SODIUM SUCC 125 MG IJ SOLR
INTRAMUSCULAR | Status: AC
  Filled 2014-06-05: qty 2

## 2014-06-05 MED ORDER — LEVOFLOXACIN 500 MG PO TABS: 500.0000 mg | ORAL_TABLET | Freq: Every day | ORAL | Status: DC

## 2014-06-05 MED ORDER — METHYLPREDNISOLONE SODIUM SUCC 125 MG IJ SOLR
125.0000 mg | Freq: Once | INTRAMUSCULAR | Status: AC
  Administered 2014-06-05: 125 mg via INTRAMUSCULAR

## 2014-06-05 MED ORDER — HYDROCOD POLST-CHLORPHEN POLST 10-8 MG/5ML PO LQCR: 5.0000 mL | Freq: Two times a day (BID) | ORAL | Status: DC | PRN

## 2014-06-05 NOTE — Discharge Instructions (Signed)
Take all of medicine, drink lots of fluids, no more smoking, see your doctor if further problems  °

## 2014-06-05 NOTE — ED Provider Notes (Signed)
CSN: [id number]     Arrival date & time 06/05/14  1442 History   First MD Initiated Contact with Patient 06/05/14 1504     Chief Complaint  Patient presents with  . Influenza  . Cough   (Consider location/radiation/quality/duration/timing/severity/associated sxs/prior Treatment) Patient is a 59 y.o. male presenting with cough. The history is provided by the patient.  Cough Cough characteristics:  Productive Sputum characteristics:  Yellow Severity:  Moderate Onset quality:  Gradual Duration:  1 week Progression:  Worsening Chronicity:  New Smoker: yes   Context: weather changes   Relieved by:  None tried Worsened by:  Nothing tried Ineffective treatments:  None tried Associated symptoms: headaches, rhinorrhea, shortness of breath, sore throat and wheezing   Associated symptoms: no fever     Past Medical History  Diagnosis Date  . Thyroid disease   . Neuromuscular disorder   . Depression   . Gout    Past Surgical History  Procedure Laterality Date  . Cholecystectomy     History reviewed. No pertinent family history. History  Substance Use Topics  . Smoking status: Current Every Day Smoker -- 0.50 packs/day    Types: Cigarettes  . Smokeless tobacco: Not on file  . Alcohol Use: No    Review of Systems  Constitutional: Negative.  Negative for fever.  HENT: Positive for congestion, rhinorrhea and sore throat.   Respiratory: Positive for cough, shortness of breath and wheezing.   Gastrointestinal: Positive for nausea. Negative for vomiting and diarrhea.  Neurological: Positive for headaches.    Allergies  Voltaren  Home Medications   Prior to Admission medications   Medication Sig Start Date End Date Taking? Authorizing Provider  aspirin EC 81 MG tablet Take 1 tablet (81 mg total) by mouth daily. 04/16/14  Yes Penny Pia, MD  levothyroxine (SYNTHROID, LEVOTHROID) 175 MCG tablet Take 1 tablet (175 mcg total) by mouth daily before breakfast. 05/25/14  Yes  Quentin Angst, MD  QUEtiapine (SEROQUEL XR) 50 MG TB24 24 hr tablet Take 100 mg by mouth at bedtime.   Yes Historical Provider, MD  rosuvastatin (CRESTOR) 20 MG tablet Take 1 tablet (20 mg total) by mouth daily. 05/20/14  Yes Quentin Angst, MD  acetaminophen-codeine (TYLENOL #3) 300-30 MG per tablet Take 1 tablet by mouth every 8 (eight) hours as needed for moderate pain. 05/06/14   Quentin Angst, MD  chlordiazePOXIDE (LIBRIUM) 25 MG capsule Take 1 capsule (25 mg total) by mouth 3 (three) times daily as needed for anxiety. Patient not taking: Reported on 05/06/2014 04/16/14   Penny Pia, MD  chlorpheniramine-HYDROcodone [facility]) 10-8 MG/5ML LQCR Take 5 mLs by mouth every 12 (twelve) hours as needed for cough. 06/05/14   Linna Hoff, MD  clonazePAM (KLONOPIN) 1 MG tablet Take 1 mg by mouth 2 (two) times daily.    Historical Provider, MD  colchicine 0.6 MG tablet Take 0.6 mg by mouth daily as needed (gout flare up).    Historical Provider, MD  fluticasone-salmeterol (ADVAIR HFA) 115-21 MCG/ACT inhaler Inhale 2 puffs into the lungs 2 (two) times daily.    Historical Provider, MD  levofloxacin (LEVAQUIN) 500 MG tablet Take 1 tablet (500 mg total) by mouth daily. 06/05/14   Linna Hoff, MD   BP 113/71 mmHg  Pulse 85  Temp(Src) 98.1 F (36.7 C) (Oral)  Resp 16  SpO2 97% Physical Exam  Constitutional: He is oriented to person, place, and time. He appears well-developed and well-nourished.  HENT:  Head: Normocephalic.  Right Ear: External ear normal.  Left Ear: External ear normal.  Mouth/Throat: Oropharynx is clear and moist.  Eyes: Pupils are equal, round, and reactive to light.  Neck: Normal range of motion. Neck supple.  Cardiovascular: Normal rate, regular rhythm, normal heart sounds and intact distal pulses.   Pulmonary/Chest: Effort normal. He has wheezes. He has rhonchi.  Lymphadenopathy:    He has no cervical adenopathy.  Neurological: He is alert  and oriented to person, place, and time.  Skin: Skin is warm and dry.  Nursing note and vitals reviewed.   ED Course  Procedures (including critical care time) Labs Review Labs Reviewed - No data to display  Imaging Review Dg Chest 2 View  06/05/2014   CLINICAL DATA:  Cough.  EXAM: CHEST  2 VIEW  COMPARISON:  04/13/2014.  FINDINGS: The cardiac silhouette, mediastinal and hilar contours are within normal limits and stable. There are chronic bronchitic type interstitial lung changes and emphysema likely related to smoking. No infiltrates, edema or effusions. Basilar scarring changes are noted. The bony thorax is intact.  IMPRESSION: Chronic bronchitic type interstitial lung changes and emphysema with areas of pulmonary scarring. No acute overlying pulmonary process.   Electronically Signed   By: Loralie Champagne M.D.   On: 06/05/2014 16:21    X-rays reviewed and report per radiologist.  MDM   1. Bronchitis, asthmatic, moderate persistent, with acute exacerbation        Linna Hoff, MD 06/05/14 1649

## 2014-06-05 NOTE — ED Notes (Signed)
C/o  Productive cough with yellow sputum.  Sob.  Sore throat.  HA.   Congestion.  Nausea.   Denies vomiting fever and diarrhea.   No relief with otc meds.  Symptoms present x 1 wk.

## 2014-06-07 ENCOUNTER — Other Ambulatory Visit: Payer: Self-pay | Admitting: Internal Medicine

## 2014-06-07 ENCOUNTER — Telehealth: Payer: Self-pay | Admitting: Internal Medicine

## 2014-06-07 NOTE — Telephone Encounter (Signed)
Pt calling to request refill on acetaminophen-codeine (TYLENOL #3) 300-30 MG per tablet, please f/u with pt when ready.

## 2014-06-08 NOTE — Telephone Encounter (Signed)
Expand All Collapse All   Pt calling to request refill on acetaminophen-codeine (TYLENOL #3) 300-30 MG per tablet, please f/u with pt when ready.

## 2014-06-09 ENCOUNTER — Other Ambulatory Visit: Payer: Self-pay | Admitting: Internal Medicine

## 2014-06-15 ENCOUNTER — Other Ambulatory Visit: Payer: Self-pay | Admitting: Internal Medicine

## 2014-06-16 NOTE — Telephone Encounter (Signed)
Pt requesting Rx refill for tylenol 3 and Crestor.  Pt. Still has refills for Crestor.

## 2014-06-21 ENCOUNTER — Other Ambulatory Visit: Payer: Self-pay | Admitting: Internal Medicine

## 2014-07-16 ENCOUNTER — Ambulatory Visit (INDEPENDENT_AMBULATORY_CARE_PROVIDER_SITE_OTHER): Payer: Self-pay

## 2014-07-16 ENCOUNTER — Ambulatory Visit (INDEPENDENT_AMBULATORY_CARE_PROVIDER_SITE_OTHER): Payer: Self-pay | Admitting: Internal Medicine

## 2014-07-16 VITALS — BP 126/62 | HR 79 | Temp 98.1°F | Resp 16 | Ht 75.0 in | Wt 254.0 lb

## 2014-07-16 DIAGNOSIS — T148 Other injury of unspecified body region: Secondary | ICD-10-CM

## 2014-07-16 DIAGNOSIS — T148XXA Other injury of unspecified body region, initial encounter: Secondary | ICD-10-CM

## 2014-07-16 DIAGNOSIS — K2990 Gastroduodenitis, unspecified, without bleeding: Secondary | ICD-10-CM

## 2014-07-16 DIAGNOSIS — M25571 Pain in right ankle and joints of right foot: Secondary | ICD-10-CM

## 2014-07-16 DIAGNOSIS — S82401A Unspecified fracture of shaft of right fibula, initial encounter for closed fracture: Secondary | ICD-10-CM

## 2014-07-16 DIAGNOSIS — K2970 Gastritis, unspecified, without bleeding: Secondary | ICD-10-CM

## 2014-07-16 MED ORDER — OMEPRAZOLE 20 MG PO CPDR
20.0000 mg | DELAYED_RELEASE_CAPSULE | Freq: Every day | ORAL | Status: DC
Start: 2014-07-16 — End: 2015-04-14

## 2014-07-16 MED ORDER — CYCLOBENZAPRINE HCL 10 MG PO TABS: 10.0000 mg | ORAL_TABLET | Freq: Three times a day (TID) | ORAL | Status: DC | PRN

## 2014-07-16 MED ORDER — HYDROCODONE-ACETAMINOPHEN 5-325 MG PO TABS: 1.0000 | ORAL_TABLET | Freq: Four times a day (QID) | ORAL | Status: DC | PRN

## 2014-07-16 NOTE — Patient Instructions (Addendum)
No weight bearing. Use crutches and leave the splint on until follow up with ortho in one week.  Norco as directed for pain. Prilosec as directed for gastritis. If your symptom worsen return to the ER.

## 2014-07-16 NOTE — Progress Notes (Signed)
   Subjective:    Patient ID: Paul CapersJames Mark Harshfield, male    DOB: April 28, 1956, 59 y.o.   MRN: [id number]  HPI 59 year old gentleman  Chief complaint 1. pain in right ankle and foot 2. abdominal pain  1.Onset of right foot and ankle  pain 5 days ago when he lost balance because of a chronic injury to the left foot and he fell  And landed onto his right foot and he banged his thigh on the wall. Immediate pain 10/10 Severity is severe  Unable to bear weight pain on his right ankle and foot , Using a cane to walk radiates up his left leg, he has swelling up to mid calf of his right leg Pain increases with motion Is bearing weight now but only with a cane Also has bruising and pain in the upper right thigh Denies other injuries  2.onset of abdominal pain 3 days ago he thinks it is from taking a lot of aleve and ibuprofen for ankle pain. no blood in stool or dark stool .pain mid epigastrium achy Worse with taking otc nsai meds No nausea, no vomiting or diarhhea    Review of Systems  Constitutional: Positive for activity change and fatigue.  Eyes: Negative.   Respiratory: Negative.   Cardiovascular: Negative.   Gastrointestinal: Positive for abdominal pain.  Endocrine: Negative.   Genitourinary: Negative.   Skin: Positive for wound.       Ecchymosis upper thigh and ankle of right leg and foot  Allergic/Immunologic: Negative.   Neurological: Negative.   Hematological: Negative.   Psychiatric/Behavioral: Negative.   All other systems reviewed and are negative.      Objective:   Physical Exam  Constitutional: He is oriented to person, place, and time. He appears well-developed and well-nourished.  HENT:  Head: Normocephalic and atraumatic.  Right Ear: External ear normal.  Eyes: Conjunctivae and EOM are normal. Pupils are equal, round, and reactive to light.  Neck: Normal range of motion. Neck supple.  Cardiovascular: Normal rate and regular rhythm.   Pulmonary/Chest:  Effort normal and breath sounds normal.  Abdominal: Soft. Bowel sounds are normal.  Musculoskeletal: He exhibits edema and tenderness.  Pain swelling ecchymosis of the lateral malleolus  Pain and ecchymosis of the medial thigh of the right leg Knee no swelling or crepitus    Neurological: He is alert and oriented to person, place, and time. He has normal reflexes.  Skin: Skin is warm and dry.  Psychiatric: He has a normal mood and affect. His behavior is normal. Judgment and thought content normal.  Nursing note and vitals reviewed.   [facility] reading (PRIMARY) by  Dr. Mindi JunkerGottlieb Right Ankle- Spiral  fracture of the right distal fibular. Right foot negitive other than disatl fibular fracture tib fib right negitive other than distal  fibular fracture        Assessment & Plan:  1. Pain at right ankle  Xray with spiral fracture of the right fibula/lateral malleolus Sugar tongs ocl splint applied. Neurovascular checked after application of splint and is normal follow up with ortho in one week Crutches and non weight bearing rx for norco for pain  2. Abdominal pain Probably gastritis secondary to nsai use because of pain Will stop nsai and rx prilosec follow up at [facility] in one week

## 2014-07-23 ENCOUNTER — Other Ambulatory Visit: Payer: Self-pay | Admitting: Internal Medicine

## 2014-07-26 ENCOUNTER — Other Ambulatory Visit: Payer: Self-pay | Admitting: Internal Medicine

## 2014-07-30 ENCOUNTER — Other Ambulatory Visit: Payer: Self-pay | Admitting: Internal Medicine

## 2014-07-30 MED ORDER — ACETAMINOPHEN-CODEINE #3 300-30 MG PO TABS: 1.0000 | ORAL_TABLET | Freq: Three times a day (TID) | ORAL | Status: DC | PRN

## 2014-08-27 ENCOUNTER — Other Ambulatory Visit: Payer: Self-pay | Admitting: Internal Medicine

## 2014-08-27 MED ORDER — ACETAMINOPHEN-CODEINE #3 300-30 MG PO TABS: 1.0000 | ORAL_TABLET | Freq: Three times a day (TID) | ORAL | Status: DC | PRN

## 2015-04-05 ENCOUNTER — Telehealth: Payer: Self-pay | Admitting: Internal Medicine

## 2015-04-05 NOTE — Telephone Encounter (Signed)
Pt. Came in requesting a med refill on Tylenol #3. Please f/u with pt.

## 2015-04-06 ENCOUNTER — Other Ambulatory Visit: Payer: Self-pay | Admitting: *Deleted

## 2015-04-06 MED ORDER — ACETAMINOPHEN-CODEINE #3 300-30 MG PO TABS: 1.0000 | ORAL_TABLET | Freq: Three times a day (TID) | ORAL | Status: DC | PRN

## 2015-04-06 NOTE — Telephone Encounter (Signed)
Patient has appointment scheduled for Nov 10, 16.   Patients tramadol was refilled with no additional refills.

## 2015-04-14 ENCOUNTER — Ambulatory Visit: Payer: Self-pay | Attending: Internal Medicine | Admitting: Internal Medicine

## 2015-04-14 ENCOUNTER — Encounter: Payer: Self-pay | Admitting: Internal Medicine

## 2015-04-14 VITALS — BP 121/73 | HR 90 | Temp 98.2°F | Resp 18 | Ht 75.0 in | Wt 262.2 lb

## 2015-04-14 DIAGNOSIS — Z1211 Encounter for screening for malignant neoplasm of colon: Secondary | ICD-10-CM | POA: Insufficient documentation

## 2015-04-14 DIAGNOSIS — E038 Other specified hypothyroidism: Secondary | ICD-10-CM | POA: Insufficient documentation

## 2015-04-14 DIAGNOSIS — M25571 Pain in right ankle and joints of right foot: Secondary | ICD-10-CM | POA: Insufficient documentation

## 2015-04-14 DIAGNOSIS — F411 Generalized anxiety disorder: Secondary | ICD-10-CM | POA: Insufficient documentation

## 2015-04-14 DIAGNOSIS — Z0000 Encounter for general adult medical examination without abnormal findings: Secondary | ICD-10-CM | POA: Insufficient documentation

## 2015-04-14 DIAGNOSIS — E785 Hyperlipidemia, unspecified: Secondary | ICD-10-CM | POA: Insufficient documentation

## 2015-04-14 LAB — COMPLETE METABOLIC PANEL WITH GFR
ALBUMIN: 4.4 g/dL (ref 3.6–5.1)
ALK PHOS: 104 U/L (ref 40–115)
ALT: 55 U/L — AB (ref 9–46)
AST: 49 U/L — ABNORMAL HIGH (ref 10–35)
BILIRUBIN TOTAL: 0.5 mg/dL (ref 0.2–1.2)
BUN: 14 mg/dL (ref 7–25)
CALCIUM: 9.8 mg/dL (ref 8.6–10.3)
CO2: 27 mmol/L (ref 20–31)
CREATININE: 1.13 mg/dL (ref 0.70–1.25)
Chloride: 100 mmol/L (ref 98–110)
GFR, EST AFRICAN AMERICAN: 81 mL/min (ref >=60)
GFR, Est Non African American: 70 mL/min (ref >=60)
Glucose, Bld: 99 mg/dL (ref 65–99)
Potassium: 4.4 mmol/L (ref 3.5–5.3)
Sodium: 137 mmol/L (ref 135–146)
TOTAL PROTEIN: 7.5 g/dL (ref 6.1–8.1)

## 2015-04-14 LAB — LIPID PANEL
CHOLESTEROL: 120 mg/dL — AB (ref 125–200)
HDL: 35 mg/dL — ABNORMAL LOW (ref >=40)
LDL Cholesterol: 12 mg/dL (ref <130)
TRIGLYCERIDES: 366 mg/dL — AB (ref <150)
Total CHOL/HDL Ratio: 3.4 Ratio (ref <=5.0)
VLDL: 73 mg/dL — AB (ref <30)

## 2015-04-14 LAB — T4, FREE: FREE T4: 1.04 ng/dL (ref 0.80–1.80)

## 2015-04-14 LAB — TSH: TSH: 2.91 u[IU]/mL (ref 0.350–4.500)

## 2015-04-14 MED ORDER — ACETAMINOPHEN-CODEINE #3 300-30 MG PO TABS: 1.0000 | ORAL_TABLET | Freq: Three times a day (TID) | ORAL | Status: DC | PRN

## 2015-04-14 MED ORDER — LEVOTHYROXINE SODIUM 175 MCG PO TABS: 175.0000 ug | ORAL_TABLET | Freq: Every day | ORAL | Status: DC

## 2015-04-14 MED ORDER — CLONAZEPAM 1 MG PO TABS: 1.0000 mg | ORAL_TABLET | Freq: Every day | ORAL | Status: DC

## 2015-04-14 MED ORDER — ROSUVASTATIN CALCIUM 20 MG PO TABS: 20.0000 mg | ORAL_TABLET | Freq: Every day | ORAL | Status: DC

## 2015-04-14 NOTE — Progress Notes (Signed)
Patient here for F/U for thyroid.  Patient complains of pain in right leg and ankle. Pain described as aching. Pain increases and swelling becomes present if patient is on his feet too long. Pain has been present since last year.  Patient requesting refill on Klonopin, Tylenol 3

## 2015-04-14 NOTE — Progress Notes (Signed)
Patient ID: Paul Jefferson, male   DOB: Oct 29, 1955, 59 y.o.   MRN: [id number]   Paul Jefferson, is a 59 y.o. male  [id number]  [id number]  DOB - Feb 21, 1956  Chief Complaint  Patient presents with  . Follow-up        Subjective:   [name] is a 59 y.o. male with history of hypothyroidism, hypertension, major depression and anxiety, dyslipidemia and chronic right ankle pain and gout here today for a follow up visit. Patient has no new complaints except for ongoing anxiety and sleeplessness. Patient claims his healing from alcohol abuse. Currently smokes about one pack of cigarettes per day, slowly cutting down. He denies the use of illicit drugs. He needs refill on all his medications today. He is not able to exercise as desired because of pain in his ankles. Patient has No headache, No chest pain, No abdominal pain - No Nausea, No new weakness tingling or numbness, No Cough - SOB.  Problem  Healthcare Maintenance  Colon Cancer Screening  Generalized Anxiety Disorder    ALLERGIES: Allergies  Allergen Reactions  . Mobic [Meloxicam] Nausea Only    PAST MEDICAL HISTORY: Past Medical History  Diagnosis Date  . Thyroid disease   . Neuromuscular disorder (HCC)   . Depression   . Gout     MEDICATIONS AT HOME: Prior to Admission medications   Medication Sig Start Date End Date Taking? Authorizing Provider  acetaminophen-codeine (TYLENOL #3) 300-30 MG tablet Take 1 tablet by mouth every 8 (eight) hours as needed. 04/14/15  Yes Quentin Angst, MD  aspirin EC 81 MG tablet Take 1 tablet (81 mg total) by mouth daily. 04/16/14  Yes Penny Pia, MD  levothyroxine (SYNTHROID, LEVOTHROID) 175 MCG tablet Take 1 tablet (175 mcg total) by mouth daily before breakfast. 04/14/15  Yes Suzanne Garbers E Hyman Hopes, MD  clonazePAM (KLONOPIN) 1 MG tablet Take 1 tablet (1 mg total) by mouth at bedtime. 04/14/15   Quentin Angst, MD  cyclobenzaprine (FLEXERIL) 10 MG tablet Take 1  tablet (10 mg total) by mouth 3 (three) times daily as needed for muscle spasms. Patient not taking: Reported on 04/14/2015 07/16/14   Sherlyn Hay, DO  rosuvastatin (CRESTOR) 20 MG tablet Take 1 tablet (20 mg total) by mouth daily. 04/14/15   Quentin Angst, MD     Objective:   Filed Vitals:   04/14/15 1436  BP: 121/73  Pulse: 90  Temp: 98.2 F (36.8 C)  TempSrc: Oral  Resp: 18  Height:  (1.905 m)  Weight: 262 lb 3.2 oz (118.933 kg)  SpO2: 95%    Exam General appearance : Awake, alert, not in any distress. Speech Clear. Not toxic looking HEENT: Atraumatic and Normocephalic, pupils equally reactive to light and accomodation Neck: supple, no JVD. No cervical lymphadenopathy.  Chest:Good air entry bilaterally, no added sounds  CVS: S1 S2 regular, no murmurs.  Abdomen: Bowel sounds present, Non tender and not distended with no gaurding, rigidity or rebound. Extremities: Right leg in cast, B/L Lower Ext shows no edema, both legs are warm to touch Neurology: Awake alert, and oriented X 3, CN II-XII intact, Non focal   Data Review Lab Results  Component Value Date   HGBA1C 5.2 05/06/2014     Assessment & Plan   1. Healthcare maintenance  - Flu Vaccine QUAD 36+ mos PF IM (Fluarix & Fluzone Quad PF)  2. Right ankle pain  - acetaminophen-codeine (TYLENOL #3) 300-30 MG tablet; Take 1 tablet by  mouth every 8 (eight) hours as needed.  Dispense: 90 tablet; Refill: 0  3. Dyslipidemia  - Lipid panel  - rosuvastatin (CRESTOR) 20 MG tablet; Take 1 tablet (20 mg total) by mouth daily.  Dispense: 90 tablet; Refill: 3 To address this please limit saturated fat to no more than 7% of your calories, limit cholesterol to 200 mg/day, increase fiber and exercise as tolerated. If needed we may add another cholesterol lowering medication to your regimen.   4. Colon cancer screening  - Ambulatory referral to Gastroenterology  5. Other specified hypothyroidism  -  levothyroxine (SYNTHROID, LEVOTHROID) 175 MCG tablet; Take 1 tablet (175 mcg total) by mouth daily before breakfast.  Dispense: 90 tablet; Refill: 3 - TSH - T4, Free  6. Generalized anxiety disorder  - clonazePAM (KLONOPIN) 1 MG tablet; Take 1 tablet (1 mg total) by mouth at bedtime.  Dispense: 30 tablet; Refill: 0 - COMPLETE METABOLIC PANEL WITH GFR  Paul Jefferson was counseled on the dangers of tobacco use, and was advised to quit. Reviewed strategies to maximize success, including removing cigarettes and smoking materials from environment, stress management and support of family/friends.  Patient have been counseled extensively about nutrition and exercise  Return in about 3 months (around 07/15/2015) for Follow up HTN, Follow up Pain and comorbidities.  The patient was given clear instructions to go to ER or return to medical center if symptoms don't improve, worsen or new problems develop. The patient verbalized understanding. The patient was told to call to get lab results if they haven't heard anything in the next week.   This note has been created with Education officer, environmentalDragon speech recognition software and smart phrase technology. Any transcriptional errors are unintentional.    Jeanann LewandowskyJEGEDE, Lydiann Bonifas, MD, MHA, FACP, FAAP, CPE [facility] [place], [place] [phone]   04/14/2015, 3:00 PM

## 2015-04-14 NOTE — Patient Instructions (Signed)
Generalized Anxiety Disorder Generalized anxiety disorder (GAD) is a mental disorder. It interferes with life functions, including relationships, work, and school. GAD is different from normal anxiety, which everyone experiences at some point in their lives in response to specific life events and activities. Normal anxiety actually helps us prepare for and get through these life events and activities. Normal anxiety goes away after the event or activity is over.  GAD causes anxiety that is not necessarily related to specific events or activities. It also causes excess anxiety in proportion to specific events or activities. The anxiety associated with GAD is also difficult to control. GAD can vary from mild to severe. People with severe GAD can have intense waves of anxiety with physical symptoms (panic attacks).  SYMPTOMS The anxiety and worry associated with GAD are difficult to control. This anxiety and worry are related to many life events and activities and also occur more days than not for 6 months or longer. People with GAD also have three or more of the following symptoms (one or more in children):  Restlessness.   Fatigue.  Difficulty concentrating.   Irritability.  Muscle tension.  Difficulty sleeping or unsatisfying sleep. DIAGNOSIS GAD is diagnosed through an assessment by your health care Paul Jefferson. Your health care Paul Jefferson will ask you questions aboutyour mood,physical symptoms, and events in your life. Your health care Paul Jefferson may ask you about your medical history and use of alcohol or drugs, including prescription medicines. Your health care Paul Jefferson may also do a physical exam and blood tests. Certain medical conditions and the use of certain substances can cause symptoms similar to those associated with GAD. Your health care Paul Jefferson may refer you to a mental health specialist for further evaluation. TREATMENT The following therapies are usually used to treat GAD:    Medication. Antidepressant medication usually is prescribed for long-term daily control. Antianxiety medicines may be added in severe cases, especially when panic attacks occur.   Talk therapy (psychotherapy). Certain types of talk therapy can be helpful in treating GAD by providing support, education, and guidance. A form of talk therapy called cognitive behavioral therapy can teach you healthy ways to think about and react to daily life events and activities.  Stress managementtechniques. These include yoga, meditation, and exercise and can be very helpful when they are practiced regularly. A mental health specialist can help determine which treatment is best for you. Some people see improvement with one therapy. However, other people require a combination of therapies.   This information is not intended to replace advice given to you by your health care Paul Jefferson. Make sure you discuss any questions you have with your health care Paul Jefferson.   Document Released: 09/15/2012 Document Revised: 06/11/2014 Document Reviewed: 09/15/2012 Elsevier Interactive Patient Education 2016 Elsevier Inc.  

## 2015-04-25 ENCOUNTER — Telehealth: Payer: Self-pay | Admitting: *Deleted

## 2015-04-25 NOTE — Telephone Encounter (Signed)
-----   Message from Quentin Angstlugbemiga E Jegede, MD sent at 04/20/2015  9:12 AM EST ----- Please inform patient that his thyroid function is now within normal limits, continue current medications. Cholesterol level is slightly high, continue cholesterol medication and also limit saturated fat to no more than 7% of your calories, limit cholesterol to 200 mg/day, increase fiber and exercise as tolerated. If needed we may add another cholesterol lowering medication to your regimen.

## 2015-04-25 NOTE — Telephone Encounter (Signed)
Medical Assistant left message on patient's home and cell voicemail. Voicemail states to give a call back to Nubia with [facility] at [phone].  

## 2015-05-06 NOTE — Telephone Encounter (Signed)
Medical Assistant left message on patient's home and cell voicemail. Voicemail states to give a call back to [place] with [facility] at [phone].  Communication letter has been mailed out to the patient.

## 2015-05-24 ENCOUNTER — Other Ambulatory Visit: Payer: Self-pay | Admitting: Internal Medicine

## 2015-05-25 ENCOUNTER — Telehealth: Payer: Self-pay | Admitting: Internal Medicine

## 2015-05-25 NOTE — Telephone Encounter (Signed)
Patient called requesting a medication refill for  Klonopin and Tylenol 3

## 2015-05-27 ENCOUNTER — Other Ambulatory Visit: Payer: Self-pay | Admitting: *Deleted

## 2015-05-27 ENCOUNTER — Telehealth: Payer: Self-pay | Admitting: Internal Medicine

## 2015-05-27 DIAGNOSIS — F411 Generalized anxiety disorder: Secondary | ICD-10-CM

## 2015-05-27 DIAGNOSIS — M25571 Pain in right ankle and joints of right foot: Secondary | ICD-10-CM

## 2015-05-27 MED ORDER — ACETAMINOPHEN-CODEINE #3 300-30 MG PO TABS: 1.0000 | ORAL_TABLET | Freq: Three times a day (TID) | ORAL | Status: DC | PRN

## 2015-05-27 NOTE — Telephone Encounter (Signed)
Patient is wondering why klonopin was refused due to "refill not appropriate". Please follow up with pt. Thank you.

## 2015-05-31 ENCOUNTER — Other Ambulatory Visit: Payer: Self-pay | Admitting: Internal Medicine

## 2015-06-09 MED FILL — LEVOTHYROXINE 175 MCG TAB: 175 | 30 days supply | Qty: 30 | Fill #1

## 2015-06-09 MED FILL — ROSUVASTATIN CAL 20 MG TAB: 20 | 30 days supply | Qty: 30 | Fill #0

## 2015-06-09 NOTE — Telephone Encounter (Signed)
Medical Assistant left message on patient's home and cell voicemail. Voicemail states to give a call back to Cristina Ceniceros with [facility] at [phone].  

## 2015-07-20 MED FILL — ROSUVASTATIN CAL 20 MG TAB: 20 | 30 days supply | Qty: 30 | Fill #1

## 2015-07-20 MED FILL — LEVOTHYROXINE 175 MCG TAB: 175 | 30 days supply | Qty: 30 | Fill #2

## 2015-07-22 ENCOUNTER — Telehealth: Payer: Self-pay | Admitting: *Deleted

## 2015-07-22 ENCOUNTER — Telehealth: Payer: Self-pay | Admitting: Internal Medicine

## 2015-07-22 DIAGNOSIS — M25571 Pain in right ankle and joints of right foot: Secondary | ICD-10-CM

## 2015-07-22 MED ORDER — ACETAMINOPHEN-CODEINE #3 300-30 MG PO TABS: 1.0000 | ORAL_TABLET | Freq: Three times a day (TID) | ORAL | Status: DC | PRN

## 2015-07-22 NOTE — Telephone Encounter (Signed)
Patient is requesting a refill on Tramadol which was last provided in December.

## 2015-07-22 NOTE — Telephone Encounter (Signed)
Pt. Came in today requesting a med refill on Tylenol # 3. Please f/u with pt. °

## 2015-07-26 NOTE — Telephone Encounter (Signed)
Pt calling to check on the status of this request. Paul Jefferson, ASA ° °

## 2015-07-26 NOTE — Telephone Encounter (Signed)
Patients Tramadol was refilled. Prescription may be picked up at the front desk on tomorrow morning.

## 2015-07-28 MED FILL — ACETAMINOPHEN/COD #3 TABLET: 300-30 | 30 days supply | Qty: 90 | Fill #0

## 2015-08-23 ENCOUNTER — Other Ambulatory Visit: Payer: Self-pay | Admitting: Internal Medicine

## 2015-08-26 NOTE — Telephone Encounter (Signed)
Pt. Called requesting a med refill on Tylenol # 3. Please f/u with pt. °

## 2015-08-30 ENCOUNTER — Other Ambulatory Visit: Payer: Self-pay | Admitting: Internal Medicine

## 2015-08-30 ENCOUNTER — Telehealth: Payer: Self-pay | Admitting: Internal Medicine

## 2015-08-30 NOTE — Telephone Encounter (Signed)
Patient called requesting a refill for tylenol 3. Please follow up.

## 2015-08-31 NOTE — Telephone Encounter (Signed)
Patiens medication was refilled. Awaiting PCP signature.

## 2015-09-05 ENCOUNTER — Other Ambulatory Visit: Payer: Self-pay | Admitting: *Deleted

## 2015-09-05 DIAGNOSIS — M25571 Pain in right ankle and joints of right foot: Secondary | ICD-10-CM

## 2015-09-05 MED ORDER — ACETAMINOPHEN-CODEINE #3 300-30 MG PO TABS: 1.0000 | ORAL_TABLET | Freq: Three times a day (TID) | ORAL | Status: DC | PRN

## 2015-09-05 MED FILL — ACETAMINOPHEN/COD #3 TABLET: 300-30 | 30 days supply | Qty: 90 | Fill #0

## 2015-09-05 MED FILL — LEVOTHYROXINE 175 MCG TAB: 175 | 30 days supply | Qty: 30 | Fill #3

## 2015-09-05 MED FILL — ROSUVASTATIN CAL 20 MG TAB: 20 | 30 days supply | Qty: 30 | Fill #2

## 2015-09-05 NOTE — Telephone Encounter (Signed)
Patients prescription was refilled due to PCP being out of the office. Prior script was voided and shredded.

## 2015-10-19 MED FILL — ROSUVASTATIN CAL 20 MG TAB: 20 | 30 days supply | Qty: 30 | Fill #3

## 2015-10-24 IMAGING — CR DG CHEST 1V PORT
2 series · 2 of 2 positions shown · non-contrast
Comparison: Portable exam 5614 hr without priors for comparison

CLINICAL DATA: Mid to LEFT side chest pain radiating around to
back, shortness of breath and cough since yesterday, nausea, history
smoking

EXAM:
PORTABLE CHEST - 1 VIEW

[AP (1 of 2)]
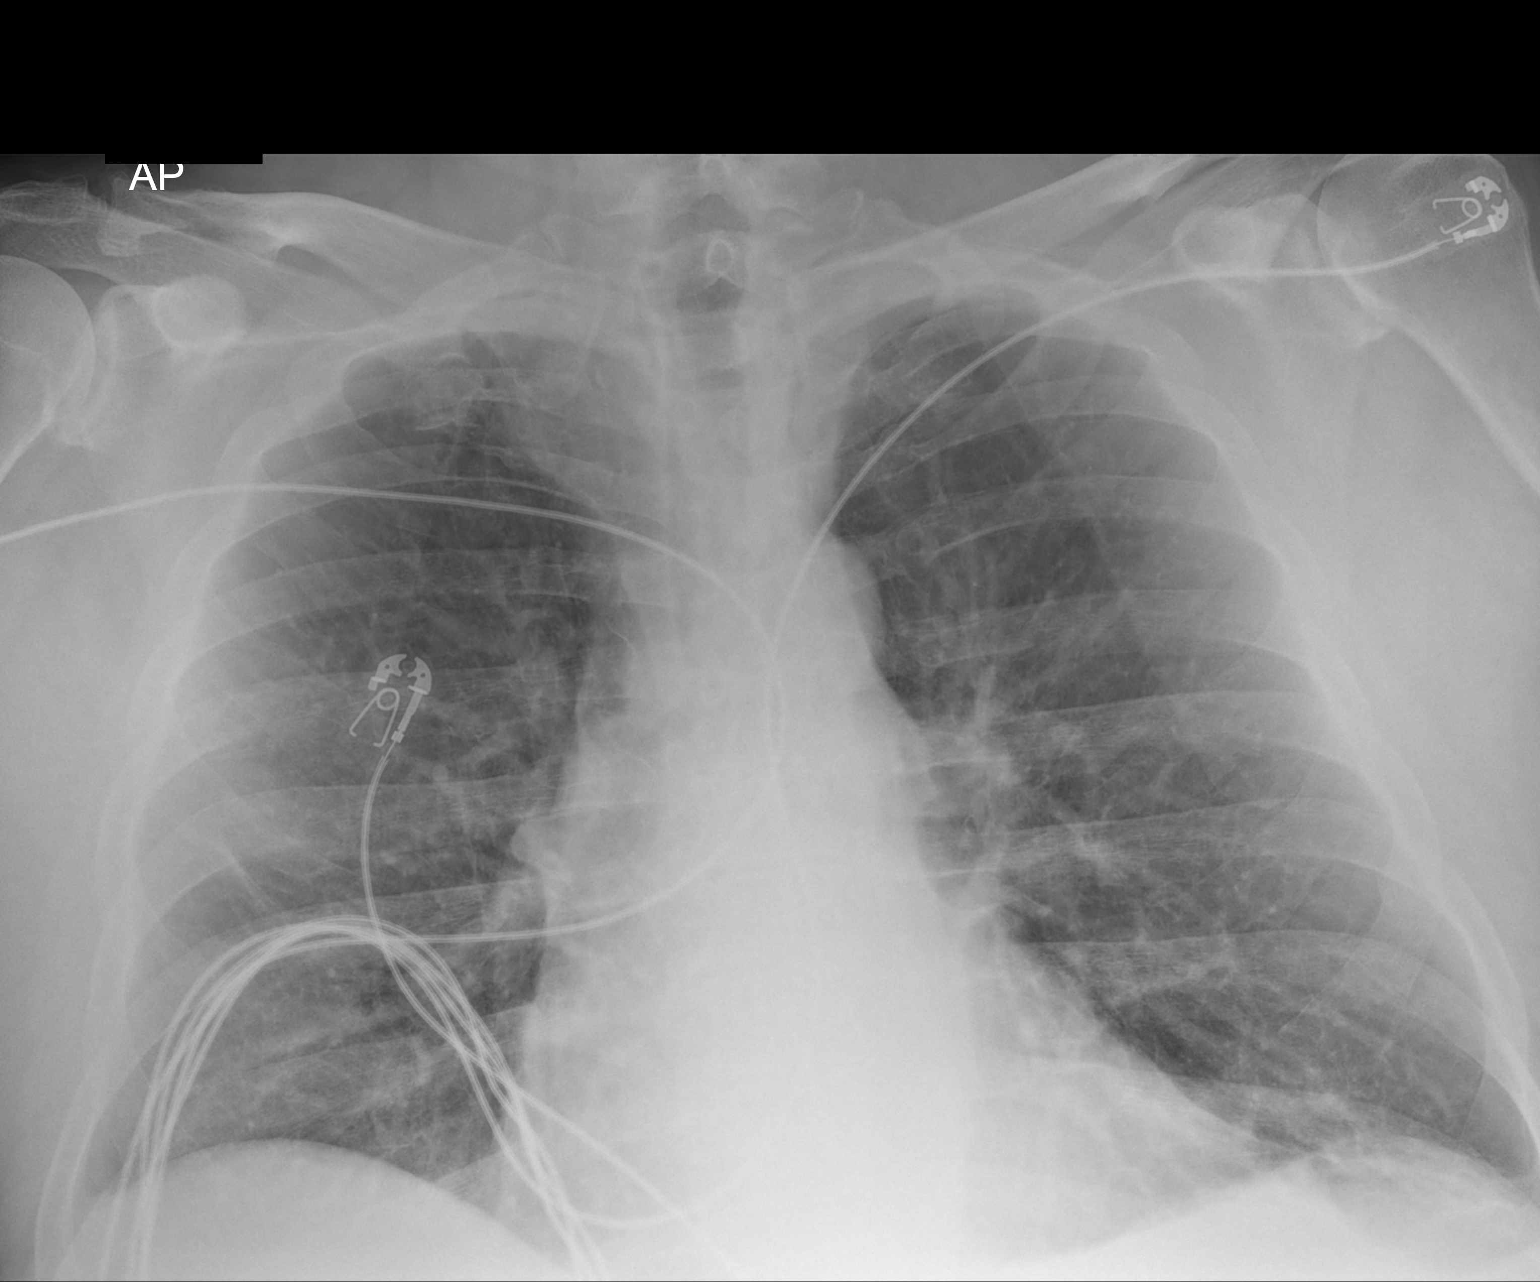

[AP (2 of 2)]
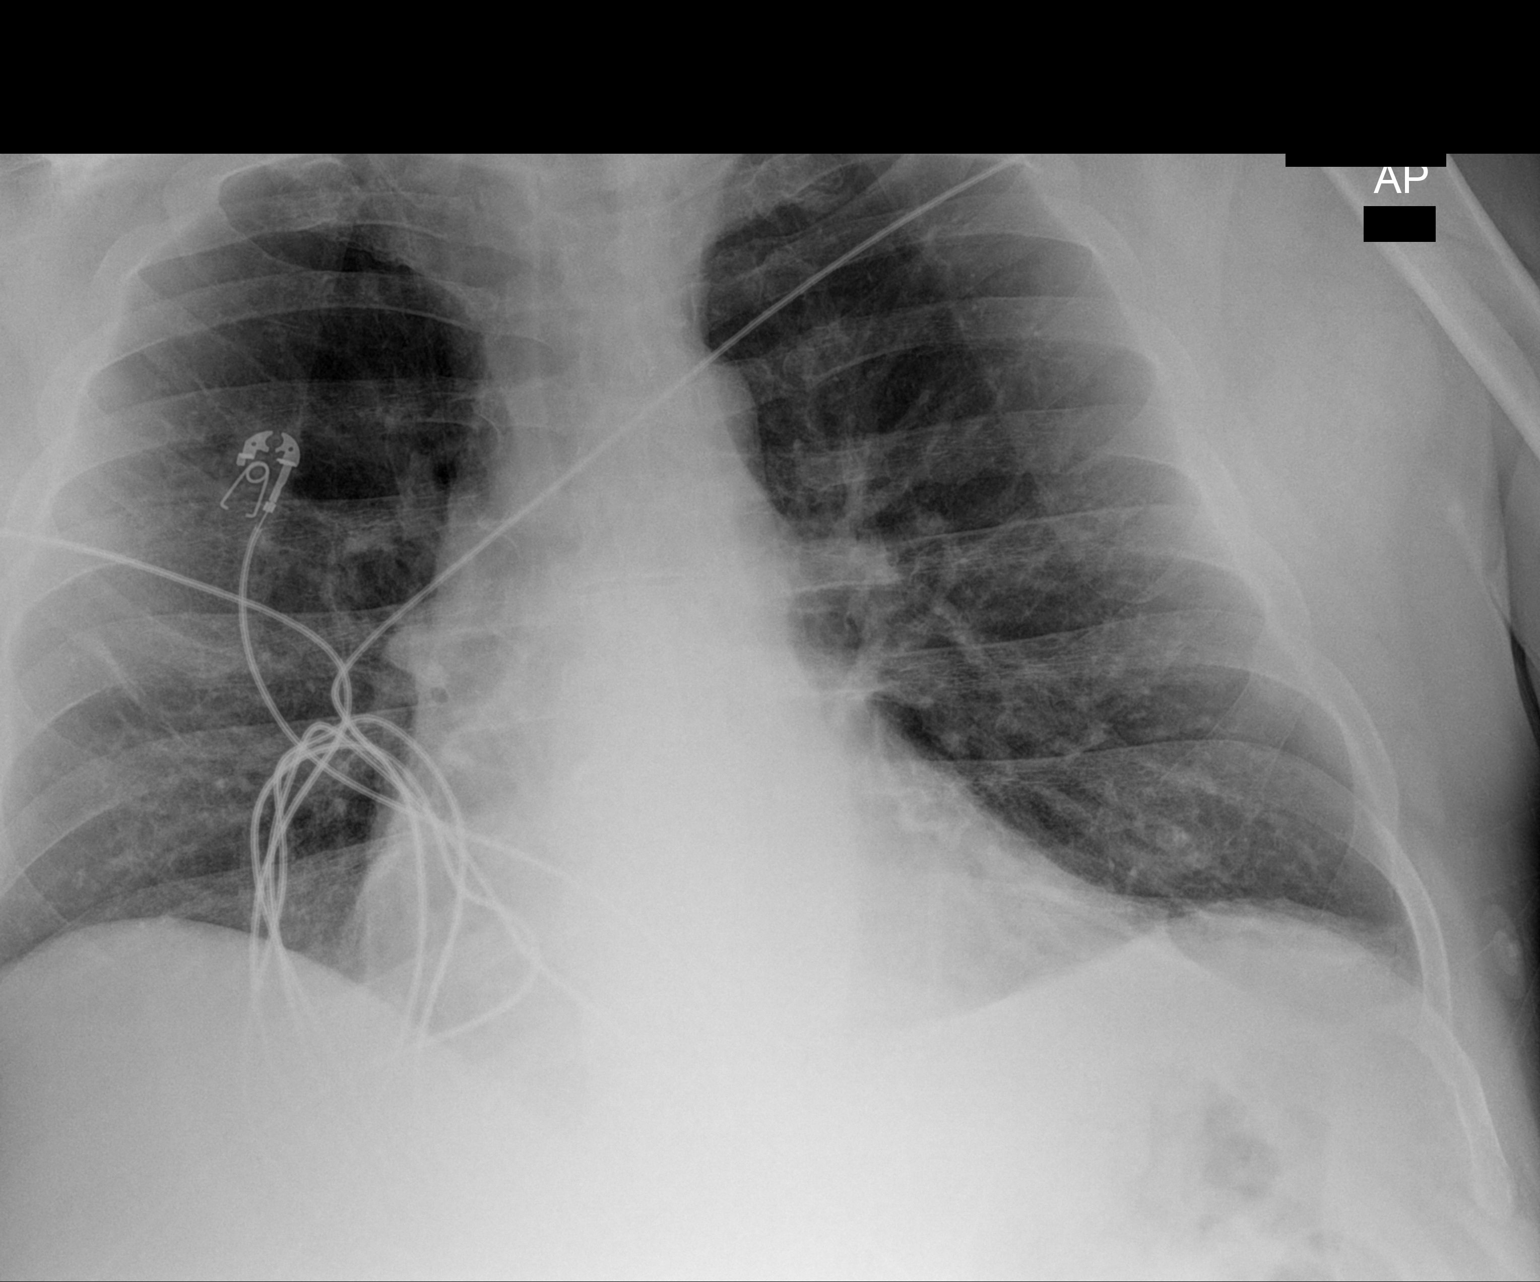

[2 of 2 positions shown; findings below may reference images not displayed]

FINDINGS: Upper normal heart size.

Normal mediastinal contours and pulmonary vascularity.

Minimal LEFT basilar atelectasis.

Lungs otherwise clear.

No pleural effusion or pneumothorax.

No acute osseous findings.
IMPRESSION: Minimal LEFT basilar atelectasis.

## 2015-10-25 ENCOUNTER — Telehealth: Payer: Self-pay | Admitting: Internal Medicine

## 2015-10-25 MED FILL — LEVOTHYROXINE 175 MCG TAB: 175 | 30 days supply | Qty: 30 | Fill #4

## 2015-10-25 NOTE — Telephone Encounter (Signed)
Patient needs Tylenol 3. Please follow up.

## 2015-11-23 MED FILL — ?LEVOTHYROXINE 175 MCG TABL: 175 | 30 days supply | Qty: 30 | Fill #5

## 2015-11-23 MED FILL — ROSUVASTATIN CAL 20 MG TAB: 20 | 30 days supply | Qty: 30 | Fill #4

## 2015-11-24 ENCOUNTER — Other Ambulatory Visit: Payer: Self-pay | Admitting: *Deleted

## 2015-11-24 NOTE — Telephone Encounter (Signed)
Last refilled on 09/15/15 Patient is requesting a refill at this time.

## 2015-11-25 NOTE — Telephone Encounter (Signed)
Yes

## 2015-11-30 NOTE — Telephone Encounter (Signed)
Pt. Came into facility requesting a refill on Tylenol # 3.  Please f/u with pt.

## 2015-12-07 ENCOUNTER — Other Ambulatory Visit: Payer: Self-pay | Admitting: *Deleted

## 2015-12-07 DIAGNOSIS — M25571 Pain in right ankle and joints of right foot: Secondary | ICD-10-CM

## 2015-12-07 MED ORDER — ACETAMINOPHEN-CODEINE #3 300-30 MG PO TABS: 1.0000 | ORAL_TABLET | Freq: Three times a day (TID) | ORAL | Status: DC | PRN

## 2015-12-07 NOTE — Telephone Encounter (Signed)
Patients Tylenol 3 was refilled. Patient instructed to complete and sign the pain contract upon pickup.

## 2015-12-13 MED FILL — ACETAMINOPHEN/COD #3 TABLET: 300-30 | 30 days supply | Qty: 90 | Fill #0

## 2015-12-27 MED FILL — ?LEVOTHYROXINE 175 MCG TABL: 175 | 30 days supply | Qty: 30 | Fill #6

## 2015-12-27 MED FILL — ROSUVASTATIN CAL 20 MG TAB: 20 | 30 days supply | Qty: 30 | Fill #5

## 2015-12-29 ENCOUNTER — Ambulatory Visit: Payer: Self-pay | Attending: Internal Medicine | Admitting: Internal Medicine

## 2015-12-29 ENCOUNTER — Encounter: Payer: Self-pay | Admitting: Internal Medicine

## 2015-12-29 VITALS — BP 138/84 | HR 92 | Temp 98.0°F | Resp 16 | Wt 263.4 lb

## 2015-12-29 DIAGNOSIS — F411 Generalized anxiety disorder: Secondary | ICD-10-CM

## 2015-12-29 DIAGNOSIS — J449 Chronic obstructive pulmonary disease, unspecified: Secondary | ICD-10-CM

## 2015-12-29 DIAGNOSIS — E038 Other specified hypothyroidism: Secondary | ICD-10-CM

## 2015-12-29 DIAGNOSIS — E785 Hyperlipidemia, unspecified: Secondary | ICD-10-CM

## 2015-12-29 DIAGNOSIS — G894 Chronic pain syndrome: Secondary | ICD-10-CM

## 2015-12-29 LAB — LIPID PANEL
CHOLESTEROL: 110 mg/dL — AB (ref 125–200)
HDL: 34 mg/dL — ABNORMAL LOW (ref >=40)
TRIGLYCERIDES: 520 mg/dL — AB (ref <150)
Total CHOL/HDL Ratio: 3.2 Ratio (ref <=5.0)

## 2015-12-29 LAB — COMPLETE METABOLIC PANEL WITH GFR
ALBUMIN: 4.1 g/dL (ref 3.6–5.1)
ALK PHOS: 222 U/L — AB (ref 40–115)
ALT: 96 U/L — AB (ref 9–46)
AST: 135 U/L — AB (ref 10–35)
BILIRUBIN TOTAL: 0.4 mg/dL (ref 0.2–1.2)
BUN: 12 mg/dL (ref 7–25)
CALCIUM: 9.1 mg/dL (ref 8.6–10.3)
CHLORIDE: 102 mmol/L (ref 98–110)
CO2: 22 mmol/L (ref 20–31)
CREATININE: 0.92 mg/dL (ref 0.70–1.25)
GFR, Est Non African American: 89 mL/min (ref >=60)
Glucose, Bld: 98 mg/dL (ref 65–99)
Potassium: 5.2 mmol/L (ref 3.5–5.3)
Sodium: 136 mmol/L (ref 135–146)
Total Protein: 7.6 g/dL (ref 6.1–8.1)

## 2015-12-29 LAB — TSH: TSH: 21.87 m[IU]/L — AB (ref 0.40–4.50)

## 2015-12-29 LAB — T4, FREE: FREE T4: 1 ng/dL (ref 0.8–1.8)

## 2015-12-29 MED ORDER — ALBUTEROL SULFATE HFA 108 (90 BASE) MCG/ACT IN AERS
2.0000 | INHALATION_SPRAY | Freq: Four times a day (QID) | RESPIRATORY_TRACT | 2 refills | Status: DC | PRN
Start: 2015-12-29 — End: 2016-07-03

## 2015-12-29 MED ORDER — PREGABALIN 75 MG PO CAPS: 75.0000 mg | ORAL_CAPSULE | Freq: Two times a day (BID) | ORAL | 3 refills | Status: DC

## 2015-12-29 MED ORDER — CLONAZEPAM 1 MG PO TABS: 1.0000 mg | ORAL_TABLET | Freq: Every day | ORAL | 0 refills | Status: DC

## 2015-12-29 MED ORDER — LEVOTHYROXINE SODIUM 175 MCG PO TABS: 175.0000 ug | ORAL_TABLET | Freq: Every day | ORAL | 3 refills | Status: DC

## 2015-12-29 MED ORDER — ROSUVASTATIN CALCIUM 20 MG PO TABS: 20.0000 mg | ORAL_TABLET | Freq: Every day | ORAL | 3 refills | Status: DC

## 2015-12-29 MED FILL — VENTOLIN HFA 90 MCG INHALER: 108 (90 BAS | 25 days supply | Qty: 18 | Fill #0

## 2015-12-29 NOTE — Progress Notes (Addendum)
Paul Jefferson, is a 60 y.o. male  [id number]  [id number]  DOB - 1956/02/18  Chief Complaint  Patient presents with  . Follow-up    Thyroid        Subjective:   Paul Jefferson is a 60 y.o. male with history of major depression, hypothyroidism, generalized anxiety, gout and chronic pain syndrome here today for a follow up visit. Patient is requesting medication refill. He complains that he has ongoing anxiety and sleeplessness from multiple stressors of life. He is still smoking heavily, about 1 pack per day but now requesting help to quit smoking. He denies the use of illicit drugs. He denies any suicidal ideations or thoughts. Patient is willing to try Lyrica for his pain since he is not able to get the stronger narcotics from this clinic and he does not want to go to pain clinic because of insurance issues. He lives alone but no domestic issues of concern. He is independent. Patient has No headache, No chest pain, No abdominal pain - No Nausea, No new weakness tingling or numbness. He occasionally experience shortness of breath but has not been officially diagnosed with COPD.  Problem  Chronic Pain Syndrome    ALLERGIES: Allergies  Allergen Reactions  . Mobic [Meloxicam] Nausea Only    PAST MEDICAL HISTORY: Past Medical History:  Diagnosis Date  . Depression   . Gout   . Neuromuscular disorder (HCC)   . Thyroid disease     MEDICATIONS AT HOME: Prior to Admission medications   Medication Sig Start Date End Date Taking? Authorizing Provider  acetaminophen-codeine (TYLENOL #3) 300-30 MG tablet Take 1 tablet by mouth every 8 (eight) hours as needed. 12/07/15  Yes Quentin Angst, MD  aspirin EC 81 MG tablet Take 1 tablet (81 mg total) by mouth daily. 04/16/14  Yes Penny Pia, MD  clonazePAM (KLONOPIN) 1 MG tablet Take 1 tablet (1 mg total) by mouth at bedtime. 12/29/15  Yes Quentin Angst, MD  levothyroxine (SYNTHROID, LEVOTHROID) 175 MCG tablet Take 1 tablet  (175 mcg total) by mouth daily before breakfast. 12/29/15  Yes Quentin Angst, MD  rosuvastatin (CRESTOR) 20 MG tablet Take 1 tablet (20 mg total) by mouth daily. 12/29/15  Yes Quentin Angst, MD  albuterol (PROVENTIL HFA;VENTOLIN HFA) 108 (90 Base) MCG/ACT inhaler Inhale 2 puffs into the lungs every 6 (six) hours as needed for wheezing or shortness of breath. 12/29/15   Quentin Angst, MD  cyclobenzaprine (FLEXERIL) 10 MG tablet Take 1 tablet (10 mg total) by mouth 3 (three) times daily as needed for muscle spasms. Patient not taking: Reported on 04/14/2015 07/16/14   Sherlyn Hay, DO  pregabalin (LYRICA) 75 MG capsule Take 1 capsule (75 mg total) by mouth 2 (two) times daily. 12/29/15   Quentin Angst, MD     Objective:   Vitals:   12/29/15 1438  BP: 138/84  Pulse: 92  Resp: 16  Temp: 98 F (36.7 C)  TempSrc: Oral  SpO2: 97%  Weight: 263 lb 6.4 oz (119.5 kg)    Exam General appearance : Awake, alert, not in any distress. Speech Clear. Not toxic looking, obese HEENT: Atraumatic and Normocephalic, pupils equally reactive to light and accomodation Neck: Supple, no JVD. No cervical lymphadenopathy.  Chest: Good air entry bilaterally, no added sounds  CVS: S1 S2 regular, no murmurs.  Abdomen: Bowel sounds present, Non tender and not distended with no gaurding, rigidity or rebound. Extremities: B/L Lower Ext shows no edema,  both legs are warm to touch Neurology: Awake alert, and oriented X 3, CN II-XII intact, Non focal Skin: No Rash  Data Review Lab Results  Component Value Date   HGBA1C 5.2 05/06/2014     Assessment & Plan   1. Generalized anxiety disorder  - clonazePAM (KLONOPIN) 1 MG tablet; Take 1 tablet (1 mg total) by mouth at bedtime.  Dispense: 30 tablet; Refill: 0  2. Dyslipidemia  - rosuvastatin (CRESTOR) 20 MG tablet; Take 1 tablet (20 mg total) by mouth daily.  Dispense: 90 tablet; Refill: 3 - Lipid panel  To address this please limit  saturated fat to no more than 7% of your calories, limit cholesterol to 200 mg/day, increase fiber and exercise as tolerated. If needed we may add another cholesterol lowering medication to your regimen.   3. Chronic pain syndrome  - pregabalin (LYRICA) 75 MG capsule; Take 1 capsule (75 mg total) by mouth 2 (two) times daily.  Dispense: 180 capsule; Refill: 3 - COMPLETE METABOLIC PANEL WITH GFR  4. Other specified hypothyroidism  - levothyroxine (SYNTHROID, LEVOTHROID) 175 MCG tablet; Take 1 tablet (175 mcg total) by mouth daily before breakfast.  Dispense: 90 tablet; Refill: 3 - TSH - T4, Free  5. COPD mixed type (HCC)  - albuterol (PROVENTIL HFA;VENTOLIN HFA) 108 (90 Base) MCG/ACT inhaler; Inhale 2 puffs into the lungs every 6 (six) hours as needed for wheezing or shortness of breath.  Dispense: 1 Inhaler; Refill: 2 - Pulmonary function test; Future  Paul Jefferson was counseled on the dangers of tobacco use, and was advised to quit. Reviewed strategies to maximize success, including removing cigarettes and smoking materials from environment, stress management and support of family/friends. Patient was given appointment with CPP Stacey smoking cessation counseling and resources management  Patient have been counseled extensively about nutrition and exercise  Return in about 3 months (around 03/30/2016) for COPD, Generalized Anxiety Disorder.  The patient was given clear instructions to go to ER or return to medical center if symptoms don't improve, worsen or new problems develop. The patient verbalized understanding. The patient was told to call to get lab results if they haven't heard anything in the next week.   This note has been created with Education officer, environmental. Any transcriptional errors are unintentional.    Jeanann Lewandowsky, MD, MHA, FACP, FAAP, CPE [facility] [place], [place] [phone]   12/29/2015,  5:20 PM

## 2015-12-29 NOTE — Patient Instructions (Signed)
Smoking Cessation, Tips for Success If you are ready to quit smoking, congratulations! You have chosen to help yourself be healthier. Cigarettes bring nicotine, tar, carbon monoxide, and other irritants into your body. Your lungs, heart, and blood vessels will be able to work better without these poisons. There are many different ways to quit smoking. Nicotine gum, nicotine patches, a nicotine inhaler, or nicotine nasal spray can help with physical craving. Hypnosis, support groups, and medicines help break the habit of smoking. WHAT THINGS CAN I DO TO MAKE QUITTING EASIER?  Here are some tips to help you quit for good:  Pick a date when you will quit smoking completely. Tell all of your friends and family about your plan to quit on that date.  Do not try to slowly cut down on the number of cigarettes you are smoking. Pick a quit date and quit smoking completely starting on that day.  Throw away all cigarettes.   Clean and remove all ashtrays from your home, work, and car.  On a card, write down your reasons for quitting. Carry the card with you and read it when you get the urge to smoke.  Cleanse your body of nicotine. Drink enough water and fluids to keep your urine clear or pale yellow. Do this after quitting to flush the nicotine from your body.  Learn to predict your moods. Do not let a bad situation be your excuse to have a cigarette. Some situations in your life might tempt you into wanting a cigarette.  Never have "just one" cigarette. It leads to wanting another and another. Remind yourself of your decision to quit.  Change habits associated with smoking. If you smoked while driving or when feeling stressed, try other activities to replace smoking. Stand up when drinking your coffee. Brush your teeth after eating. Sit in a different chair when you read the paper. Avoid alcohol while trying to quit, and try to drink fewer caffeinated beverages. Alcohol and caffeine may urge you to  smoke.  Avoid foods and drinks that can trigger a desire to smoke, such as sugary or spicy foods and alcohol.  Ask people who smoke not to smoke around you.  Have something planned to do right after eating or having a cup of coffee. For example, plan to take a walk or exercise.  Try a relaxation exercise to calm you down and decrease your stress. Remember, you may be tense and nervous for the first 2 weeks after you quit, but this will pass.  Find new activities to keep your hands busy. Play with a pen, coin, or rubber band. Doodle or draw things on paper.  Brush your teeth right after eating. This will help cut down on the craving for the taste of tobacco after meals. You can also try mouthwash.   Use oral substitutes in place of cigarettes. Try using lemon drops, carrots, cinnamon sticks, or chewing gum. Keep them handy so they are available when you have the urge to smoke.  When you have the urge to smoke, try deep breathing.  Designate your home as a nonsmoking area.  If you are a heavy smoker, ask your health care provider about a prescription for nicotine chewing gum. It can ease your withdrawal from nicotine.  Reward yourself. Set aside the cigarette money you save and buy yourself something nice.  Look for support from others. Join a support group or smoking cessation program. Ask someone at home or at work to help you with your plan   to quit smoking.  Always ask yourself, "Do I need this cigarette or is this just a reflex?" Tell yourself, "Today, I choose not to smoke," or "I do not want to smoke." You are reminding yourself of your decision to quit.  Do not replace cigarette smoking with electronic cigarettes (commonly called e-cigarettes). The safety of e-cigarettes is unknown, and some may contain harmful chemicals.  If you relapse, do not give up! Plan ahead and think about what you will do the next time you get the urge to smoke. HOW WILL I FEEL WHEN I QUIT SMOKING? You  may have symptoms of withdrawal because your body is used to nicotine (the addictive substance in cigarettes). You may crave cigarettes, be irritable, feel very hungry, cough often, get headaches, or have difficulty concentrating. The withdrawal symptoms are only temporary. They are strongest when you first quit but will go away within 10-14 days. When withdrawal symptoms occur, stay in control. Think about your reasons for quitting. Remind yourself that these are signs that your body is healing and getting used to being without cigarettes. Remember that withdrawal symptoms are easier to treat than the major diseases that smoking can cause.  Even after the withdrawal is over, expect periodic urges to smoke. However, these cravings are generally short lived and will go away whether you smoke or not. Do not smoke! WHAT RESOURCES ARE AVAILABLE TO HELP ME QUIT SMOKING? Your health care provider can direct you to community resources or hospitals for support, which may include:  Group support.  Education.  Hypnosis.  Therapy.   This information is not intended to replace advice given to you by your health care provider. Make sure you discuss any questions you have with your health care provider.   Document Released: 02/17/2004 Document Revised: 06/11/2014 Document Reviewed: 11/06/2012 Elsevier Interactive Patient Education 2016 Else[employer] Inc[employer] Generalized Anxiety Disorder Generalized anxiety disorder (GAD) is a mental disorder. It interferes with life functions, including relationships, work, and school. GAD is different from normal anxiety, which everyone experiences at some point in their lives in response to specific life events and activities. Normal anxiety actually helps [place] prepare for and get through these life events and activities. Normal anxiety goes away after the event or activity is over.  GAD causes anxiety that is not necessarily related to specific events or activities. It also causes  excess anxiety in proportion to specific events or activities. The anxiety associated with GAD is also difficult to control. GAD can vary from mild to severe. People with severe GAD can have intense waves of anxiety with physical symptoms (panic attacks).  SYMPTOMS The anxiety and worry associated with GAD are difficult to control. This anxiety and worry are related to many life events and activities and also occur more days than not for 6 months or longer. People with GAD also have three or more of the following symptoms (one or more in children):  Restlessness.   Fatigue.  Difficulty concentrating.   Irritability.  Muscle tension.  Difficulty sleeping or unsatisfying sleep. DIAGNOSIS GAD is diagnosed through an assessment by your health care provider. Your health care provider will ask you questions aboutyour mood,physical symptoms, and events in your life. Your health care provider may ask you about your medical history and use of alcohol or drugs, including prescription medicines. Your health care provider may also do a physical exam and blood tests. Certain medical conditions and the use of certain substances can cause symptoms similar to those associated with GAD.  Your health care provider may refer you to a mental health specialist for further evaluation. TREATMENT The following therapies are usually used to treat GAD:   Medication. Antidepressant medication usually is prescribed for long-term daily control. Antianxiety medicines may be added in severe cases, especially when panic attacks occur.   Talk therapy (psychotherapy). Certain types of talk therapy can be helpful in treating GAD by providing support, education, and guidance. A form of talk therapy called cognitive behavioral therapy can teach you healthy ways to think about and react to daily life events and activities.  Stress managementtechniques. These include yoga, meditation, and exercise and can be very helpful when  they are practiced regularly. A mental health specialist can help determine which treatment is best for you. Some people see improvement with one therapy. However, other people require a combination of therapies.   This information is not intended to replace advice given to you by your health care provider. Make sure you discuss any questions you have with your health care provider.   Document Released: 09/15/2012 Document Revised: 06/11/2014 Document Reviewed: 09/15/2012 Elsevier Interactive Patient Education 2016 Elsevier Inc[employer] Chronic Pain Chronic pain can be defined as pain that is off and on and lasts for 3-6 months or longer. Many things cause chronic pain, which can make it difficult to make a diagnosis. There are many treatment options available for chronic pain. However, finding a treatment that works well for you may require trying various approaches until the right one is found. Many people benefit from a combination of two or more types of treatment to control their pain. SYMPTOMS  Chronic pain can occur anywhere in the body and can range from mild to very severe. Some types of chronic pain include:  Headache.  Low back pain.  Cancer pain.  Arthritis pain.  Neurogenic pain. This is pain resulting from damage to nerves. People with chronic pain may also have other symptoms such as:  Depression.  Anger.  Insomnia.  Anxiety. DIAGNOSIS  Your health care provider will help diagnose your condition over time. In many cases, the initial focus will be on excluding possible conditions that could be causing the pain. Depending on your symptoms, your health care provider may order tests to diagnose your condition. Some of these tests may include:   Blood tests.   CT scan.   MRI.   X-rays.   Ultrasounds.   Nerve conduction studies.  You may need to see a specialist.  TREATMENT  Finding treatment that works well may take time. You may be referred to a pain  specialist. He or she may prescribe medicine or therapies, such as:   Mindful meditation or yoga.  Shots (injections) of numbing or pain-relieving medicines into the spine or area of pain.  Local electrical stimulation.  Acupuncture.   Massage therapy.   Aroma, color, light, or sound therapy.   Biofeedback.   Working with a physical therapist to keep from getting stiff.   Regular, gentle exercise.   Cognitive or behavioral therapy.   Group support.  Sometimes, surgery may be recommended.  HOME CARE INSTRUCTIONS   Take all medicines as directed by your health care provider.   Lessen stress in your life by relaxing and doing things such as listening to calming music.   Exercise or be active as directed by your health care provider.   Eat a healthy diet and include things such as vegetables, fruits, fish, and lean meats in your diet.   Keep all follow-up  appointments with your health care provider.   Attend a support group with others suffering from chronic pain. SEEK MEDICAL CARE IF:   Your pain gets worse.   You develop a new pain that was not there before.   You cannot tolerate medicines given to you by your health care provider.   You have new symptoms since your last visit with your health care provider.  SEEK IMMEDIATE MEDICAL CARE IF:   You feel weak.   You have decreased sensation or numbness.   You lose control of bowel or bladder function.   Your pain suddenly gets much worse.   You develop shaking.  You develop chills.  You develop confusion.  You develop chest pain.  You develop shortness of breath.  MAKE SURE YOU:  Understand these instructions.  Will watch your condition.  Will get help right away if you are not doing well or get worse.   This information is not intended to replace advice given to you by your health care provider. Make sure you discuss any questions you have with your health care provider.    Document Released: 02/10/2002 Document Revised: 01/21/2013 Document Reviewed: 11/14/2012 Elsevier Interactive Patient Education [employer]

## 2015-12-30 ENCOUNTER — Telehealth: Payer: Self-pay | Admitting: Internal Medicine

## 2015-12-30 NOTE — Telephone Encounter (Signed)
Tara from the Respiratory Department called to get clarification on an order that was put in for the pt. The order is for a pulmonary function test. Please f/u

## 2016-01-02 NOTE — Telephone Encounter (Signed)
MA spoke with Dept and clarified PCP's Order.  Medical Assistant left message on patient's home and cell voicemail. Voicemail states to give a call back to [place] with [facility] at [phone].  !!!Please inform patient of PFT being scheduled for Monday 01/09/16 at 11:00. Patient CAN NOT SMOKE, DRINK CAFFEINE or use BREATHING MEDICATIONS 4 hours prior to the appointment!!!

## 2016-01-09 ENCOUNTER — Encounter (HOSPITAL_COMMUNITY): Payer: Self-pay

## 2016-01-16 ENCOUNTER — Encounter (HOSPITAL_COMMUNITY): Payer: Self-pay

## 2016-01-16 ENCOUNTER — Telehealth: Payer: Self-pay | Admitting: *Deleted

## 2016-01-16 NOTE — Telephone Encounter (Signed)
-----   Message from Quentin Angstlugbemiga E Jegede, MD sent at 01/04/2016  9:51 AM EDT ----- Please inform patient that his lab results are mostly within normal limits, except for slightly elevated liver enzymes. Avoid alcohol and tylenol containing medications, will follow up. Restart your thyroid medication and follow up as schedule for repeat thyroid function test.

## 2016-01-16 NOTE — Telephone Encounter (Signed)
Medical Assistant left message on patient's home and cell voicemail. Voicemail states to give a call back to Nubia with [facility] at [phone].  

## 2016-02-08 MED FILL — ?LEVOTHYROXINE 175 MCG TABL: 175 | 30 days supply | Qty: 30 | Fill #7

## 2016-02-08 MED FILL — ROSUVASTATIN CAL 20 MG TAB: 20 | 30 days supply | Qty: 30 | Fill #6

## 2016-02-08 MED FILL — VENTOLIN HFA 90 MCG INHALER: 108 (90 BAS | 25 days supply | Qty: 18 | Fill #1

## 2016-03-07 MED FILL — ?LEVOTHYROXINE 175 MCG TABL: 175 | 30 days supply | Qty: 30 | Fill #8

## 2016-03-07 MED FILL — VENTOLIN HFA 90 MCG INHALER: 108 (90 BAS | 25 days supply | Qty: 18 | Fill #2

## 2016-03-07 MED FILL — ROSUVASTATIN CALCIUM 20 MG: 20 | 30 days supply | Qty: 30 | Fill #7

## 2016-04-04 MED FILL — ?LEVOTHYROXINE 175 MCG TABL: 175 | 30 days supply | Qty: 30 | Fill #9

## 2016-04-04 MED FILL — ROSUVASTATIN CALCIUM 20 MG: 20 | 30 days supply | Qty: 30 | Fill #8

## 2016-04-12 ENCOUNTER — Other Ambulatory Visit: Payer: Self-pay | Admitting: Internal Medicine

## 2016-04-12 DIAGNOSIS — M10049 Idiopathic gout, unspecified hand: Secondary | ICD-10-CM

## 2016-04-12 MED ORDER — COLCHICINE 0.6 MG PO TABS: ORAL_TABLET | ORAL | 0 refills | Status: DC

## 2016-04-12 MED FILL — COLCHICINE 0.6 MG TABLET: 0.6 | 2 days supply | Qty: 4 | Fill #0

## 2016-04-16 ENCOUNTER — Other Ambulatory Visit: Payer: Self-pay | Admitting: Internal Medicine

## 2016-04-16 ENCOUNTER — Telehealth: Payer: Self-pay | Admitting: Internal Medicine

## 2016-04-16 DIAGNOSIS — M10049 Idiopathic gout, unspecified hand: Secondary | ICD-10-CM

## 2016-04-16 MED FILL — COLCHICINE 0.6 MG TABLET: 0.6 | 2 days supply | Qty: 4 | Fill #0

## 2016-04-16 NOTE — Telephone Encounter (Signed)
Colchicine refilled

## 2016-04-16 NOTE — Telephone Encounter (Signed)
Patient is needing a refill for colchicine. Please follow up.

## 2016-04-23 ENCOUNTER — Other Ambulatory Visit: Payer: Self-pay | Admitting: *Deleted

## 2016-04-23 DIAGNOSIS — M10049 Idiopathic gout, unspecified hand: Secondary | ICD-10-CM

## 2016-04-23 MED ORDER — COLCHICINE 0.6 MG PO TABS: 0.6000 mg | ORAL_TABLET | Freq: Every day | ORAL | 3 refills | Status: DC

## 2016-04-23 NOTE — Telephone Encounter (Signed)
PRINTED FOR PASS PROGRAM 

## 2016-05-03 ENCOUNTER — Encounter: Payer: Self-pay | Admitting: Internal Medicine

## 2016-05-03 ENCOUNTER — Ambulatory Visit: Payer: Self-pay | Attending: Internal Medicine | Admitting: Internal Medicine

## 2016-05-03 ENCOUNTER — Other Ambulatory Visit: Payer: Self-pay | Admitting: *Deleted

## 2016-05-03 VITALS — BP 113/70 | HR 91 | Temp 98.3°F | Resp 18 | Ht 75.0 in | Wt 274.0 lb

## 2016-05-03 DIAGNOSIS — G894 Chronic pain syndrome: Secondary | ICD-10-CM | POA: Insufficient documentation

## 2016-05-03 DIAGNOSIS — M10049 Idiopathic gout, unspecified hand: Secondary | ICD-10-CM

## 2016-05-03 DIAGNOSIS — Z79899 Other long term (current) drug therapy: Secondary | ICD-10-CM | POA: Insufficient documentation

## 2016-05-03 DIAGNOSIS — F17210 Nicotine dependence, cigarettes, uncomplicated: Secondary | ICD-10-CM | POA: Insufficient documentation

## 2016-05-03 DIAGNOSIS — E785 Hyperlipidemia, unspecified: Secondary | ICD-10-CM | POA: Insufficient documentation

## 2016-05-03 DIAGNOSIS — Z7982 Long term (current) use of aspirin: Secondary | ICD-10-CM | POA: Insufficient documentation

## 2016-05-03 DIAGNOSIS — F329 Major depressive disorder, single episode, unspecified: Secondary | ICD-10-CM | POA: Insufficient documentation

## 2016-05-03 DIAGNOSIS — M1000 Idiopathic gout, unspecified site: Secondary | ICD-10-CM | POA: Insufficient documentation

## 2016-05-03 DIAGNOSIS — J449 Chronic obstructive pulmonary disease, unspecified: Secondary | ICD-10-CM | POA: Insufficient documentation

## 2016-05-03 DIAGNOSIS — E038 Other specified hypothyroidism: Secondary | ICD-10-CM | POA: Insufficient documentation

## 2016-05-03 DIAGNOSIS — F411 Generalized anxiety disorder: Secondary | ICD-10-CM | POA: Insufficient documentation

## 2016-05-03 LAB — POCT GLYCOSYLATED HEMOGLOBIN (HGB A1C): Hemoglobin A1C: 5.5

## 2016-05-03 MED ORDER — ACETAMINOPHEN-CODEINE #3 300-30 MG PO TABS: 1.0000 | ORAL_TABLET | Freq: Three times a day (TID) | ORAL | 0 refills | Status: DC | PRN

## 2016-05-03 MED ORDER — PREGABALIN 75 MG PO CAPS: 75.0000 mg | ORAL_CAPSULE | Freq: Two times a day (BID) | ORAL | 3 refills | Status: DC

## 2016-05-03 MED ORDER — CLONAZEPAM 1 MG PO TABS: 1.0000 mg | ORAL_TABLET | Freq: Every day | ORAL | 0 refills | Status: DC

## 2016-05-03 MED ORDER — COLCHICINE 0.6 MG PO TABS: ORAL_TABLET | ORAL | 0 refills | Status: DC

## 2016-05-03 MED ORDER — CYCLOBENZAPRINE HCL 10 MG PO TABS: 10.0000 mg | ORAL_TABLET | Freq: Three times a day (TID) | ORAL | 3 refills | Status: DC | PRN

## 2016-05-03 MED ORDER — LEVOTHYROXINE SODIUM 175 MCG PO TABS: 175.0000 ug | ORAL_TABLET | Freq: Every day | ORAL | 3 refills | Status: DC

## 2016-05-03 MED ORDER — ROSUVASTATIN CALCIUM 20 MG PO TABS: 20.0000 mg | ORAL_TABLET | Freq: Every day | ORAL | 3 refills | Status: DC

## 2016-05-03 MED FILL — ACETAMINOPHEN/COD #3 TABLET: 300-30 | 30 days supply | Qty: 90 | Fill #0

## 2016-05-03 MED FILL — ?LEVOTHYROXINE 175 MCG TABL: 175 | 30 days supply | Qty: 30 | Fill #0

## 2016-05-03 MED FILL — ?CYCLOBENZAPRINE 10 MG TABL: 10 | 10 days supply | Qty: 30 | Fill #0

## 2016-05-03 MED FILL — ROSUVASTATIN CALCIUM 20 MG: 20 | 30 days supply | Qty: 30 | Fill #0

## 2016-05-03 MED FILL — COLCHICINE 0.6 MG TABLET: 0.6 | 2 days supply | Qty: 6 | Fill #0

## 2016-05-03 NOTE — Patient Instructions (Signed)
Chronic Back Pain When back pain lasts longer than 3 months, it is called chronic back pain.The cause of your back pain may not be known. Some common causes include:  Wear and tear (degenerative disease) of the bones, ligaments, or disks in your back.  Inflammation and stiffness in your back (arthritis). People who have chronic back pain often go through certain periods in which the pain is more intense (flare-ups). Many people can learn to manage the pain with home care. Follow these instructions at home: Pay attention to any changes in your symptoms. Take these actions to help with your pain: Activity  Avoid bending and activities that make the problem worse.  Do not sit or stand in one place for long periods of time.  Take brief periods of rest throughout the day. This will reduce your pain. Resting in a lying or standing position is usually better than sitting to rest.  When you are resting for longer periods, mix in some mild activity or stretching between periods of rest. This will help to prevent stiffness and pain.  Get regular exercise. Ask your health care provider what activities are safe for you.  Do not lift anything that is heavier than 10 lb (4.5 kg). Always use proper lifting technique, which includes:  Bending your knees.  Keeping the load close to your body.  Avoiding twisting. Managing pain  If directed, apply ice to the painful area. Your health care provider may recommend applying ice during the first 24-48 hours after a flare-up begins.  Put ice in a plastic bag.  Place a towel between your skin and the bag.  Leave the ice on for 20 minutes, 2-3 times per day.  After icing, apply heat to the affected area as often as told by your health care provider. Use the heat source that your health care provider recommends, such as a moist heat pack or a heating pad.  Place a towel between your skin and the heat source.  Leave the heat on for 20-30  minutes.  Remove the heat if your skin turns bright red. This is especially important if you are unable to feel pain, heat, or cold. You may have a greater risk of getting burned.  Try soaking in a warm tub.  Take over-the-counter and prescription medicines only as told by your health care provider.  Keep all follow-up visits as told by your health care provider. This is important. Contact a health care provider if:  You have pain that is not relieved with rest or medicine. Get help right away if:  You have weakness or numbness in one or both of your legs or feet.  You have trouble controlling your bladder or your bowels.  You have nausea or vomiting.  You have pain in your abdomen.  You have shortness of breath or you faint. This information is not intended to replace advice given to you by your health care provider. Make sure you discuss any questions you have with your health care provider. Document Released: 06/28/2004 Document Revised: 09/29/2015 Document Reviewed: 11/08/2014 Elsevier Interactive Patient Education  2017 Elsevier Inc. Generalized Anxiety Disorder Generalized anxiety disorder (GAD) is a mental disorder. It interferes with life functions, including relationships, work, and school. GAD is different from normal anxiety, which everyone experiences at some point in their lives in response to specific life events and activities. Normal anxiety actually helps us prepare for and get through these life events and activities. Normal anxiety goes away after the event  or activity is over.  GAD causes anxiety that is not necessarily related to specific events or activities. It also causes excess anxiety in proportion to specific events or activities. The anxiety associated with GAD is also difficult to control. GAD can vary from mild to severe. People with severe GAD can have intense waves of anxiety with physical symptoms (panic attacks).  SYMPTOMS The anxiety and worry  associated with GAD are difficult to control. This anxiety and worry are related to many life events and activities and also occur more days than not for 6 months or longer. People with GAD also have three or more of the following symptoms (one or more in children):  Restlessness.   Fatigue.  Difficulty concentrating.   Irritability.  Muscle tension.  Difficulty sleeping or unsatisfying sleep. DIAGNOSIS GAD is diagnosed through an assessment by your health care provider. Your health care provider will ask you questions aboutyour mood,physical symptoms, and events in your life. Your health care provider may ask you about your medical history and use of alcohol or drugs, including prescription medicines. Your health care provider may also do a physical exam and blood tests. Certain medical conditions and the use of certain substances can cause symptoms similar to those associated with GAD. Your health care provider may refer you to a mental health specialist for further evaluation. TREATMENT The following therapies are usually used to treat GAD:   Medication. Antidepressant medication usually is prescribed for long-term daily control. Antianxiety medicines may be added in severe cases, especially when panic attacks occur.   Talk therapy (psychotherapy). Certain types of talk therapy can be helpful in treating GAD by providing support, education, and guidance. A form of talk therapy called cognitive behavioral therapy can teach you healthy ways to think about and react to daily life events and activities.  Stress managementtechniques. These include yoga, meditation, and exercise and can be very helpful when they are practiced regularly. A mental health specialist can help determine which treatment is best for you. Some people see improvement with one therapy. However, other people require a combination of therapies. This information is not intended to replace advice given to you by your  health care provider. Make sure you discuss any questions you have with your health care provider. Document Released: 09/15/2012 Document Revised: 06/11/2014 Document Reviewed: 09/15/2012 Elsevier Interactive Patient Education  2017 [employer]

## 2016-05-03 NOTE — Progress Notes (Signed)
Patient is here for COPD FU  Patient has taken medication today. Patient has eaten today.  Patient complains of Right wrist and foot pain. Patient also complains of Left leg.  Patient complains of pain being present for the past 3 weeks. Pain is described as throbbing.

## 2016-05-03 NOTE — Progress Notes (Signed)
Paul GarfinkelJames Jefferson, is a 60 y.o. male  WGN:[id number]:[id number]  HQI:[id number]  DOB - [birth year]  Chief Complaint  Patient presents with  . COPD      Subjective:   Paul Jefferson is a 60 y.o. male with history of major depression, hypothyroidism, generalized anxiety, gout and chronic pain syndrome here today for a follow up visit and medication refill. He complains that he has ongoing anxiety and sleeplessness from multiple stressors of life. He is still smoking heavily, about 1 pack per day. He also complain of right wrist and foot pain for about 3 weeks. No history of trauma, no swelling, no redness. No fever. He needs refill of his gout medications and others. Patient has No headache, No chest pain, No abdominal pain - No Nausea, No new weakness tingling or numbness, No Cough - SOB.  Problem  Copd Mixed Type (Hcc)    ALLERGIES: Allergies  Allergen Reactions  . Mobic [Meloxicam] Nausea Only    PAST MEDICAL HISTORY: Past Medical History:  Diagnosis Date  . Depression   . Gout   . Neuromuscular disorder (HCC)   . Thyroid disease     MEDICATIONS AT HOME: Prior to Admission medications   Medication Sig Start Date End Date Taking? Authorizing Provider  albuterol (PROVENTIL HFA;VENTOLIN HFA) 108 (90 Base) MCG/ACT inhaler Inhale 2 puffs into the lungs every 6 (six) hours as needed for wheezing or shortness of breath. 12/29/15  Yes Quentin Angstlugbemiga E Stepan Verrette, MD  aspirin EC 81 MG tablet Take 1 tablet (81 mg total) by mouth daily. 04/16/14  Yes Penny Piarlando Vega, MD  clonazePAM (KLONOPIN) 1 MG tablet Take 1 tablet (1 mg total) by mouth at bedtime. 05/03/16  Yes Quentin Angstlugbemiga E Jalayla Chrismer, MD  colchicine 0.6 MG tablet Take 2 tabs today, repeat in 12 hours 05/03/16  Yes Quentin Angstlugbemiga E Tatym Schermer, MD  cyclobenzaprine (FLEXERIL) 10 MG tablet Take 1 tablet (10 mg total) by mouth 3 (three) times daily as needed for muscle spasms. 07/16/14  Yes Sheryl Edwina BarthL Gottlieb, DO  levothyroxine (SYNTHROID, LEVOTHROID) 175 MCG tablet Take 1  tablet (175 mcg total) by mouth daily before breakfast. 05/03/16  Yes Grasiela Jonsson E Hyman HopesJegede, MD  rosuvastatin (CRESTOR) 20 MG tablet Take 1 tablet (20 mg total) by mouth daily. 05/03/16  Yes Quentin Angstlugbemiga E Leemon Ayala, MD  acetaminophen-codeine (TYLENOL #3) 300-30 MG tablet Take 1 tablet by mouth every 8 (eight) hours as needed. 05/03/16   Quentin Angstlugbemiga E Lyvonne Cassell, MD  pregabalin (LYRICA) 75 MG capsule Take 1 capsule (75 mg total) by mouth 2 (two) times daily. 05/03/16   Quentin Angstlugbemiga E Travious Vanover, MD    Objective:   Vitals:   05/03/16 1438  BP: 113/70  Pulse: 91  Resp: 18  Temp: 98.3 F (36.8 C)  TempSrc: Oral  SpO2: 97%  Weight: 274 lb (124.3 kg)  Height: 6\' 3"  (1.905 m)   Exam General appearance : Awake, alert, not in any distress. Speech Clear. Not toxic looking HEENT: Atraumatic and Normocephalic, pupils equally reactive to light and accomodation Neck: Supple, no JVD. No cervical lymphadenopathy.  Chest: Good air entry bilaterally, no added sounds  CVS: S1 S2 regular, no murmurs.  Abdomen: Bowel sounds present, Non tender and not distended with no gaurding, rigidity or rebound. Extremities: B/L Lower Ext shows no edema, both legs are warm to touch Neurology: Awake alert, and oriented X 3, CN II-XII intact, Non focal Skin: No Rash  Data Review Lab Results  Component Value Date   HGBA1C 5.2 05/06/2014    Assessment &  Plan   1. Generalized anxiety disorder  - clonazePAM (KLONOPIN) 1 MG tablet; Take 1 tablet (1 mg total) by mouth at bedtime.  Dispense: 30 tablet; Refill: 0  2. COPD mixed type (HCC)  Paul Jefferson was counseled on the dangers of tobacco use, and was advised to quit. Reviewed strategies to maximize success, including removing cigarettes and smoking materials from environment, stress management and support of family/friends.  3. Dyslipidemia  - rosuvastatin (CRESTOR) 20 MG tablet; Take 1 tablet (20 mg total) by mouth daily.  Dispense: 90 tablet; Refill: 3  4. Chronic pain  syndrome  - pregabalin (LYRICA) 75 MG capsule; Take 1 capsule (75 mg total) by mouth 2 (two) times daily.  Dispense: 180 capsule; Refill: 3  5. Acute idiopathic gout of hand, unspecified laterality  - colchicine 0.6 MG tablet; Take 2 tabs today, repeat in 12 hours  Dispense: 6 tablet; Refill: 0 - acetaminophen-codeine (TYLENOL #3) 300-30 MG tablet; Take 1 tablet by mouth every 8 (eight) hours as needed.  Dispense: 90 tablet; Refill: 0  6. Other specified hypothyroidism  - levothyroxine (SYNTHROID, LEVOTHROID) 175 MCG tablet; Take 1 tablet (175 mcg total) by mouth daily before breakfast.  Dispense: 90 tablet; Refill: 3 - TSH + free T4 - POCT glycosylated hemoglobin (Hb A1C)  Patient have been counseled extensively about nutrition and exercise. Other issues discussed during this visit include: low cholesterol diet, weight control and daily exercise, foot care, annual eye examinations at Ophthalmology, importance of adherence with medications and regular follow-up.   Return in about 6 months (around 10/31/2016) for Follow up Pain and comorbidities, Generalized Anxiety Disorder.  The patient was given clear instructions to go to ER or return to medical center if symptoms don't improve, worsen or new problems develop. The patient verbalized understanding. The patient was told to call to get lab results if they haven't heard anything in the next week.   This note has been created with Education officer, environmentalDragon speech recognition software and smart phrase technology. Any transcriptional errors are unintentional.    Jeanann LewandowskyJEGEDE, Sharvi Mooneyhan, MD, MHA, FACP, FAAP, CPE [facility] [place] , [place] [phone]   05/03/2016, 3:10 PM

## 2016-05-04 ENCOUNTER — Other Ambulatory Visit: Payer: Self-pay | Admitting: Internal Medicine

## 2016-05-04 LAB — THYROID PANEL WITH TSH
Free Thyroxine Index: 2 (ref 1.4–3.8)
T3 UPTAKE: 25 % (ref 22–35)
T4 TOTAL: 8 ug/dL (ref 4.5–12.0)
TSH: 35.32 m[IU]/L — AB (ref 0.40–4.50)

## 2016-05-04 MED ORDER — ALLOPURINOL 100 MG PO TABS: 100.0000 mg | ORAL_TABLET | Freq: Every day | ORAL | 3 refills | Status: DC

## 2016-05-04 MED FILL — ALLOPURINOL 100 MG TABLET: 100 | 30 days supply | Qty: 30 | Fill #0

## 2016-05-08 ENCOUNTER — Telehealth: Payer: Self-pay | Admitting: *Deleted

## 2016-05-08 ENCOUNTER — Telehealth: Payer: Self-pay | Admitting: Internal Medicine

## 2016-05-08 NOTE — Telephone Encounter (Signed)
Medical Assistant left message on patient's home and cell voicemail. Voicemail states to give a call back to [place] [facility] at [phone].  !!!Please inform patient of thyroid function being stable and no changes be made at this time!!!

## 2016-05-08 NOTE — Telephone Encounter (Signed)
Patient came to the office to pick up prescription and ask about the call that he missed from nurse. Informed patient of what nurse wrote on his file (Please inform patient of thyroid function being stable and no changes be made at this time). Pt would like to know more about his diabetes and his levels. Please follow up. If patient can't be reached, patient authorizes us to leave a detailed message.   Thank you.

## 2016-05-08 NOTE — Telephone Encounter (Signed)
-----   Message from Quentin Angstlugbemiga E Jegede, MD sent at 05/07/2016  3:53 PM EST ----- Thyroid function is stable. No change in therapy for now

## 2016-05-08 NOTE — Telephone Encounter (Signed)
Patient was made aware of not being Diabetic in the office.

## 2016-05-09 ENCOUNTER — Telehealth: Payer: Self-pay | Admitting: Internal Medicine

## 2016-05-09 NOTE — Telephone Encounter (Signed)
Patient called stated wants to know labs result regarding Diabetes.     Informed patient on telephone conversation says he was informed during office visit.   Per patient doesn't recall being told lab result for Diabetes. And would like to talk to [place] MA  Informed patient lab result on Thyroid.  Please follow up with patient.

## 2016-05-10 NOTE — Telephone Encounter (Signed)
Spoke with patient and reiterated the results

## 2016-06-06 ENCOUNTER — Other Ambulatory Visit: Payer: Self-pay | Admitting: Internal Medicine

## 2016-06-06 DIAGNOSIS — F411 Generalized anxiety disorder: Secondary | ICD-10-CM

## 2016-06-06 MED FILL — ?LEVOTHYROXINE 175 MCG TABL: 175 | 30 days supply | Qty: 30 | Fill #1

## 2016-06-06 MED FILL — ?CYCLOBENZAPRINE 10 MG TABL: 10 | 10 days supply | Qty: 30 | Fill #1

## 2016-06-06 MED FILL — ?ALLOPURINOL 100MG TABLET: 100 | 30 days supply | Qty: 30 | Fill #1

## 2016-07-03 ENCOUNTER — Other Ambulatory Visit: Payer: Self-pay | Admitting: Internal Medicine

## 2016-07-03 DIAGNOSIS — J449 Chronic obstructive pulmonary disease, unspecified: Secondary | ICD-10-CM

## 2016-07-10 MED FILL — ?CYCLOBENZAPRINE 10 MG TABL: 10 | 10 days supply | Qty: 30 | Fill #2

## 2016-07-10 MED FILL — ?LEVOTHYROXINE 175 MCG TABL: 175 | 30 days supply | Qty: 30 | Fill #2

## 2016-07-10 MED FILL — VENTOLIN HFA 90 MCG INHALER: 108 (90 BAS | 25 days supply | Qty: 18 | Fill #0

## 2016-07-11 ENCOUNTER — Other Ambulatory Visit: Payer: Self-pay | Admitting: *Deleted

## 2016-07-11 ENCOUNTER — Telehealth: Payer: Self-pay | Admitting: Internal Medicine

## 2016-07-11 ENCOUNTER — Encounter: Payer: Self-pay | Admitting: Internal Medicine

## 2016-07-11 ENCOUNTER — Ambulatory Visit: Payer: Self-pay | Attending: Internal Medicine | Admitting: Internal Medicine

## 2016-07-11 VITALS — BP 113/72 | HR 81 | Temp 98.8°F | Resp 18 | Ht 75.0 in | Wt 275.2 lb

## 2016-07-11 DIAGNOSIS — E785 Hyperlipidemia, unspecified: Secondary | ICD-10-CM

## 2016-07-11 DIAGNOSIS — F17210 Nicotine dependence, cigarettes, uncomplicated: Secondary | ICD-10-CM | POA: Insufficient documentation

## 2016-07-11 DIAGNOSIS — Z7982 Long term (current) use of aspirin: Secondary | ICD-10-CM | POA: Insufficient documentation

## 2016-07-11 DIAGNOSIS — Z79899 Other long term (current) drug therapy: Secondary | ICD-10-CM | POA: Insufficient documentation

## 2016-07-11 DIAGNOSIS — M10049 Idiopathic gout, unspecified hand: Secondary | ICD-10-CM

## 2016-07-11 DIAGNOSIS — F329 Major depressive disorder, single episode, unspecified: Secondary | ICD-10-CM | POA: Insufficient documentation

## 2016-07-11 DIAGNOSIS — F411 Generalized anxiety disorder: Secondary | ICD-10-CM

## 2016-07-11 DIAGNOSIS — E039 Hypothyroidism, unspecified: Secondary | ICD-10-CM

## 2016-07-11 DIAGNOSIS — G894 Chronic pain syndrome: Secondary | ICD-10-CM

## 2016-07-11 DIAGNOSIS — F419 Anxiety disorder, unspecified: Secondary | ICD-10-CM | POA: Insufficient documentation

## 2016-07-11 DIAGNOSIS — J449 Chronic obstructive pulmonary disease, unspecified: Secondary | ICD-10-CM

## 2016-07-11 LAB — COMPLETE METABOLIC PANEL WITH GFR
ALBUMIN: 4.2 g/dL (ref 3.6–5.1)
ALK PHOS: 133 U/L — AB (ref 40–115)
ALT: 62 U/L — ABNORMAL HIGH (ref 9–46)
AST: 62 U/L — ABNORMAL HIGH (ref 10–35)
BILIRUBIN TOTAL: 0.5 mg/dL (ref 0.2–1.2)
BUN: 10 mg/dL (ref 7–25)
CALCIUM: 10.1 mg/dL (ref 8.6–10.3)
CO2: 23 mmol/L (ref 20–31)
Chloride: 99 mmol/L (ref 98–110)
Creat: 0.86 mg/dL (ref 0.70–1.25)
Glucose, Bld: 107 mg/dL — ABNORMAL HIGH (ref 65–99)
POTASSIUM: 4.4 mmol/L (ref 3.5–5.3)
Sodium: 134 mmol/L — ABNORMAL LOW (ref 135–146)
TOTAL PROTEIN: 7.6 g/dL (ref 6.1–8.1)

## 2016-07-11 LAB — LIPID PANEL
CHOLESTEROL: 167 mg/dL (ref <200)
HDL: 27 mg/dL — ABNORMAL LOW (ref >40)
Total CHOL/HDL Ratio: 6.2 Ratio — ABNORMAL HIGH (ref <5.0)
Triglycerides: 479 mg/dL — ABNORMAL HIGH (ref <150)

## 2016-07-11 LAB — T4, FREE: Free T4: 1.2 ng/dL (ref 0.8–1.8)

## 2016-07-11 LAB — TSH: TSH: 17.33 m[IU]/L — AB (ref 0.40–4.50)

## 2016-07-11 MED ORDER — ACETAMINOPHEN-CODEINE #3 300-30 MG PO TABS: 1.0000 | ORAL_TABLET | Freq: Three times a day (TID) | ORAL | 0 refills | Status: DC | PRN

## 2016-07-11 MED ORDER — DULOXETINE HCL 30 MG PO CPEP: 30.0000 mg | ORAL_CAPSULE | Freq: Every day | ORAL | 3 refills | Status: DC

## 2016-07-11 MED ORDER — LEVOTHYROXINE SODIUM 175 MCG PO TABS: 175.0000 ug | ORAL_TABLET | Freq: Every day | ORAL | 3 refills | Status: DC

## 2016-07-11 MED ORDER — CLONAZEPAM 1 MG PO TABS: 1.0000 mg | ORAL_TABLET | Freq: Every day | ORAL | 0 refills | Status: DC

## 2016-07-11 MED ORDER — COLCHICINE 0.6 MG PO TABS: 0.6000 mg | ORAL_TABLET | Freq: Every day | ORAL | 3 refills | Status: DC

## 2016-07-11 MED ORDER — ROSUVASTATIN CALCIUM 20 MG PO TABS: 20.0000 mg | ORAL_TABLET | Freq: Every day | ORAL | 3 refills | Status: DC

## 2016-07-11 MED FILL — ROSUVASTATIN CALCIUM 20 MG: 20 | 30 days supply | Qty: 30 | Fill #0

## 2016-07-11 MED FILL — ACETAMINOPHEN/COD #3 TABLET: 300-30 | 30 days supply | Qty: 90 | Fill #0

## 2016-07-11 MED FILL — COLCHICINE 0.6 MG TABLET: 0.6 | 30 days supply | Qty: 30 | Fill #0

## 2016-07-11 MED FILL — DULoxetine HCL 30 MG CPEP: 30 | 30 days supply | Qty: 30 | Fill #0

## 2016-07-11 NOTE — Progress Notes (Signed)
Subjective:  Patient ID: Paul Jefferson, male    DOB: [birth year]  Age: 61 y.o. MRN: [id number]  CC: COPD  HPI [name] is a 61 y.o. male with history of major depression, hypothyroidism, generalized anxiety, gout and chronic pain syndrome here today for a follow up visit and medication refill.   Hypothyroid- He has been taking levothyroxine 175 mcg once a day. His previous TSH in November was 35.32 which was elevated from previous visits. Today, he feels like his energy level has improved from November.   COPD- He is still smoking 1/2 a pack a day.  He denies recent exacerbations. He doesn't have to use his rescue inhaler every day but does several times a week. He notices more use in the summer or during hot weather. He feels this is stable and does not think we need to increase treatment.   Dyslipidema- His last lipid profile in July 2017 showed decreased HDL and elevated triglycerides 520. He is taking crestor 20 mg once a day.  MDD- He feels this is stable. He denies hopelessness but feels exhausted due to pain. Denies SI/HI. He does not take antidepressants and has seen psychiatry in the past. He has not participated in therapy but is interested in group or one on one sessions. His sister has started cymbalta and he is interested in this medicine.  Anxiety- He complains that he has ongoing anxiety and doesn't sleep soundly. He often wakes at night and the pain makes anxious. He has had added stress recently due to family conflict.   Chronic Pain- He is taking lyrica 75 mg twice a day. He has been taking hot showers which help ease leg cramps. He also takes flexeril which has been helpful.   Gout - He recently had a gout flare which affected his feet and ankles. He occasionally notices it in his knees as well. He takes colchicine for his flares successfully.   No fever. Patient has No headache, No chest pain, No abdominal pain - No Nausea, No new weakness tingling or numbness, No  Cough - SOB.  Past Medical History:  Diagnosis Date  . Depression   . Gout   . Neuromuscular disorder (HCC)   . Thyroid disease     Past Surgical History:  Procedure Laterality Date  . CHOLECYSTECTOMY      History reviewed. No pertinent family history.  Social History  Substance Use Topics  . Smoking status: Current Every Day Smoker    Packs/day: 0.50    Types: Cigarettes  . Smokeless tobacco: Never Used  . Alcohol use No    ROS Review of Systems  Constitutional: Negative.   HENT: Negative.   Eyes: Negative.   Respiratory: Negative.   Cardiovascular: Negative.   Gastrointestinal: Negative.   Endocrine: Negative.   Genitourinary: Negative.   Musculoskeletal: Negative.   Skin: Negative.   Neurological: Negative.   Hematological: Negative.   Psychiatric/Behavioral: Negative.     Objective:   Today's Vitals: BP 113/72 (BP Location: Left Arm, Patient Position: Sitting, Cuff Size: Normal)   Pulse 81   Temp 98.8 F (37.1 C) (Oral)   Resp 18   Ht 6\' 3"  (1.905 m)   Wt 275 lb 3.2 oz (124.8 kg)   SpO2 96%   BMI 34.40 kg/m   Physical Exam  Constitutional: He is oriented to person, place, and time. He appears well-developed and well-nourished.  HENT:  Head: Normocephalic and atraumatic.  Eyes: Conjunctivae are normal. Pupils are equal, round,  and reactive to light.  Neck: Neck supple. No JVD present. No tracheal deviation present. No thyromegaly present.  Cardiovascular: Normal rate, regular rhythm and normal heart sounds.   Pulmonary/Chest: Effort normal and breath sounds normal. No respiratory distress. He has no wheezes. He has no rales.  Abdominal: Soft. Bowel sounds are normal. There is no tenderness.  Musculoskeletal: He exhibits no edema or tenderness.  Neurological: He is alert and oriented to person, place, and time.  Skin: Skin is warm and dry.  Psychiatric: He has a normal mood and affect. His behavior is normal. Thought content normal.   CBC Latest  Ref Rng & Units 05/06/2014 04/13/2014 09/05/2012  WBC 4.0 - 10.5 K/uL 13.5(H) 11.2(H) 13.2(A)  Hemoglobin 13.0 - 17.0 g/dL 09.815.3 11.914.3 14.715.6  Hematocrit 39.0 - 52.0 % 42.9 41.7 48.8  Platelets 150 - 400 K/uL 336 200 -   CMP Latest Ref Rng & Units 12/29/2015 04/14/2015 05/06/2014  Glucose 65 - 99 mg/dL 98 99 92  BUN 7 - 25 mg/dL 12 14 18   Creatinine 0.70 - 1.25 mg/dL 8.290.92 5.621.13 1.301.05  Sodium 135 - 146 mmol/L 136 137 135  Potassium 3.5 - 5.3 mmol/L 5.2 4.4 4.8  Chloride 98 - 110 mmol/L 102 100 102  CO2 20 - 31 mmol/L 22 27 22   Calcium 8.6 - 10.3 mg/dL 9.1 9.8 9.9  Total Protein 6.1 - 8.1 g/dL 7.6 7.5 8.0  Total Bilirubin 0.2 - 1.2 mg/dL 0.4 0.5 0.3  Alkaline Phos 40 - 115 U/L 222(H) 104 190(H)  AST 10 - 35 U/L 135(H) 49(H) 55(H)  ALT 9 - 46 U/L 96(H) 55(H) 53    Assessment & Plan:   No problem-specific Assessment & Plan notes found for this encounter.   Outpatient Encounter Prescriptions as of 07/11/2016  Medication Sig  . acetaminophen-codeine (TYLENOL #3) 300-30 MG tablet Take 1 tablet by mouth every 8 (eight) hours as needed.  Marland Kitchen. allopurinol (ZYLOPRIM) 100 MG tablet Take 1 tablet (100 mg total) by mouth daily.  Marland Kitchen. aspirin EC 81 MG tablet Take 1 tablet (81 mg total) by mouth daily.  . clonazePAM (KLONOPIN) 1 MG tablet Take 1 tablet (1 mg total) by mouth at bedtime.  . cyclobenzaprine (FLEXERIL) 10 MG tablet Take 1 tablet (10 mg total) by mouth 3 (three) times daily as needed for muscle spasms.  Marland Kitchen. levothyroxine (SYNTHROID, LEVOTHROID) 175 MCG tablet Take 1 tablet (175 mcg total) by mouth daily before breakfast.  . pregabalin (LYRICA) 75 MG capsule Take 1 capsule (75 mg total) by mouth 2 (two) times daily.  . rosuvastatin (CRESTOR) 20 MG tablet Take 1 tablet (20 mg total) by mouth daily.  . VENTOLIN HFA 108 (90 Base) MCG/ACT inhaler INHALE 2 PUFFS INTO THE LUNGS EVERY 6 HOURS AS NEEDED FOR WHEEZING OR SHORTNESS OF BREATH.  . [DISCONTINUED] acetaminophen-codeine (TYLENOL #3) 300-30 MG tablet  Take 1 tablet by mouth every 8 (eight) hours as needed.  . [DISCONTINUED] clonazePAM (KLONOPIN) 1 MG tablet TAKE 1 TABLET BY MOUTH AT BEDTIME  . [DISCONTINUED] colchicine 0.6 MG tablet Take 2 tabs today, repeat in 12 hours  . [DISCONTINUED] levothyroxine (SYNTHROID, LEVOTHROID) 175 MCG tablet Take 1 tablet (175 mcg total) by mouth daily before breakfast.  . [DISCONTINUED] rosuvastatin (CRESTOR) 20 MG tablet Take 1 tablet (20 mg total) by mouth daily.  . colchicine 0.6 MG tablet Take 1 tablet (0.6 mg total) by mouth daily.  . DULoxetine (CYMBALTA) 30 MG capsule Take 1 capsule (30 mg total) by mouth  daily.   No facility-administered encounter medications on file as of 07/11/2016.    Dyslipidemia- uncontrolled- Your triglycerides were elevated at your last visit. Continue taking the Crestor for now. We will recheck your cholesterol today.   COPD- stable w/o acute exacerbations. Continue your rescue inhaler for shortness of breath.   Depression - start cymbalta 30 mg once a day. This will also help your depression, pain, and anxiety. We will also refer you to social work to discuss resources in the community for group therapy.   Anxiety- Meet with the social work to find outpatient resources. We will refill your klonopin today.   Gout- stable today but you have had recent flares. We will refill your colchicine today so that you have enough for future flares.   Chronic pain- we will refill your tylenol #3 today to help with your chronic pain. If you liver function stays elevated we may have to consider switching you to Ultram.   Hypothyroidism- We will check your thyroid levels today. If the level continues to be elevated and you are taking your medication appropriately then we may need to adjust your dose of levothyroxine.   Elevated LFTs- we will check you liver function today as your liver enzymes were elevated at previous visits. Try to avoid alcohol. If the levels are consistently high we may  need to avoid tylenol switch your cholesterol medicine.   Follow-up: Return in about 6 months (around 01/08/2017) for Follow up HTN, Follow up Pain and comorbidities, Generalized Anxiety Disorder.   Consuello Masse, AGNP Student  Evaluation and management procedures were performed by me with DNP Student in attendance, note written by DNP student under my supervision and collaboration. I have reviewed the note and I agree with the management and plan.  Assessment & Plan   1. Generalized anxiety disorder  - COMPLETE METABOLIC PANEL WITH GFR - clonazePAM (KLONOPIN) 1 MG tablet; Take 1 tablet (1 mg total) by mouth at bedtime.  Dispense: 30 tablet; Refill: 0  2. COPD mixed type (HCC) Stable  3. Chronic pain syndrome Start - DULoxetine (CYMBALTA) 30 MG capsule; Take 1 capsule (30 mg total) by mouth daily.  Dispense: 30 capsule; Refill: 3  4. Dyslipidemia  - Lipid panel - rosuvastatin (CRESTOR) 20 MG tablet; Take 1 tablet (20 mg total) by mouth daily.  Dispense: 90 tablet; Refill: 3  To address this please limit saturated fat to no more than 7% of your calories, limit cholesterol to 200 mg/day, increase fiber and exercise as tolerated. If needed we may add another cholesterol lowering medication to your regimen.   5. Acute idiopathic gout of hand, unspecified laterality Refill - colchicine 0.6 MG tablet; Take 1 tablet (0.6 mg total) by mouth daily.  Dispense: 30 tablet; Refill: 3 - acetaminophen-codeine (TYLENOL #3) 300-30 MG tablet; Take 1 tablet by mouth every 8 (eight) hours as needed.  Dispense: 90 tablet; Refill: 0  6. Hypothyroidism, unspecified type  - TSH - T4, Free - levothyroxine (SYNTHROID, LEVOTHROID) 175 MCG tablet; Take 1 tablet (175 mcg total) by mouth daily before breakfast.  Dispense: 90 tablet; Refill: 3  Return in about 6 months (around 01/08/2017) for Follow up HTN, Follow up Pain and comorbidities, Generalized Anxiety Disorder.  The patient was given clear  instructions to go to ER or return to medical center if symptoms don't improve, worsen or new problems develop. The patient verbalized understanding. The patient was told to call to get lab results if they haven't heard anything in  the next week.   This note has been created with Education officer, environmental. Any transcriptional errors are unintentional.    Jeanann Lewandowsky, MD, MHA, FACP, FAAP, CPE [facility] [place], [place] [phone]   07/11/2016, 1:39 PM

## 2016-07-11 NOTE — Patient Instructions (Signed)
Hypothyroidism Hypothyroidism is a disorder of the thyroid. The thyroid is a large gland that is located in the lower front of the neck. The thyroid releases hormones that control how the body works. With hypothyroidism, the thyroid does not make enough of these hormones. What are the causes? Causes of hypothyroidism may include:  Viral infections.  Pregnancy.  Your own defense system (immune system) attacking your thyroid.  Certain medicines.  Birth defects.  Past radiation treatments to your head or neck.  Past treatment with radioactive iodine.  Past surgical removal of part or all of your thyroid.  Problems with the gland that is located in the center of your brain (pituitary). What are the signs or symptoms? Signs and symptoms of hypothyroidism may include:  Feeling as though you have no energy (lethargy).  Inability to tolerate cold.  Weight gain that is not explained by a change in diet or exercise habits.  Dry skin.  Coarse hair.  Menstrual irregularity.  Slowing of thought processes.  Constipation.  Sadness or depression. How is this diagnosed? Your health care provider may diagnose hypothyroidism with blood tests and ultrasound tests. How is this treated? Hypothyroidism is treated with medicine that replaces the hormones that your body does not make. After you begin treatment, it may take several weeks for symptoms to go away. Follow these instructions at home:  Take medicines only as directed by your health care provider.  If you start taking any new medicines, tell your health care provider.  Keep all follow-up visits as directed by your health care provider. This is important. As your condition improves, your dosage needs may change. You will need to have blood tests regularly so that your health care provider can watch your condition. Contact a health care provider if:  Your symptoms do not get better with treatment.  You are taking thyroid  replacement medicine and:  You sweat excessively.  You have tremors.  You feel anxious.  You lose weight rapidly.  You cannot tolerate heat.  You have emotional swings.  You have diarrhea.  You feel weak. Get help right away if:  You develop chest pain.  You develop an irregular heartbeat.  You develop a rapid heartbeat. This information is not intended to replace advice given to you by your health care provider. Make sure you discuss any questions you have with your health care provider. Document Released: 05/21/2005 Document Revised: 10/27/2015 Document Reviewed: 10/06/2013 Elsevier Interactive Patient Education  2017 [employer]vier Inc. Chronic Pain, Adult Chronic pain is a type of pain that lasts or keeps coming back (recurs) for at least six months. You may have chronic headaches, abdominal pain, or body pain. Chronic pain may be related to an illness, such as fibromyalgia or complex regional pain syndrome. Sometimes the cause of chronic pain is not known. Chronic pain can make it hard for you to do daily activities. If not treated, chronic pain can lead to other health problems, including anxiety and depression. Treatment depends on the cause and severity of your pain. You may need to work with a pain specialist to come up with a treatment plan. The plan may include medicine, counseling, and physical therapy. Many people benefit from a combination of two or more types of treatment to control their pain. Follow these instructions at home: Lifestyle  Consider keeping a pain diary to share with your health care providers.  Consider talking with a mental health care provider (psychologist) about how to cope with chronic pain.  Consider  joining a chronic pain support group.  Try to control or lower your stress levels. Talk to your health care provider about strategies to do this. General instructions  Take over-the-counter and prescription medicines only as told by your health  care provider.  Follow your treatment plan as told by your health care provider. This may include:  Gentle, regular exercise.  Eating a healthy diet that includes foods such as vegetables, fruits, fish, and lean meats.  Cognitive or behavioral therapy.  Working with a Adult nursephysical therapist.  Meditation or yoga.  Acupuncture or massage therapy.  Aroma, color, light, or sound therapy.  Local electrical stimulation.  Shots (injections) of numbing or pain-relieving medicines into the spine or the area of pain.  Check your pain level as told by your health care provider. Ask your health care provider if you should use a pain scale.  Learn as much as you can about how to manage your chronic pain. Ask your health care provider if an intensive pain rehabilitation program or a chronic pain specialist would be helpful.  Keep all follow-up visits as told by your health care provider. This is important. Contact a health care provider if:  Your pain gets worse.  You have new pain.  You have trouble sleeping.  You have trouble doing your normal activities.  Your pain is not controlled with treatment.  Your have side effects from pain medicine.  You feel weak. Get help right away if:  You lose feeling or have numbness in your body.  You lose control of bowel or bladder function.  Your pain suddenly gets much worse.  You develop shaking or chills.  You develop confusion.  You develop chest pain.  You have trouble breathing or shortness of breath.  You pass out.  You have thoughts about hurting yourself or others. This information is not intended to replace advice given to you by your health care provider. Make sure you discuss any questions you have with your health care provider. Document Released: 02/10/2002 Document Revised: 01/19/2016 Document Reviewed: 11/08/2015 Elsevier Interactive Patient Education  2017 [employer]

## 2016-07-11 NOTE — Telephone Encounter (Signed)
Thank you :)

## 2016-07-11 NOTE — Telephone Encounter (Signed)
Patient called the office to speak with nurse regarding his medication. Pt stated that the written prescription that he picked up on had one of his controlled medication and not two. [facility] is missing the written prescription for acetaminophen-codeine (TYLENOL #3) 300-30 MG tablet. Please call [facility] on [address] [phone].  Thank you.

## 2016-07-11 NOTE — Progress Notes (Signed)
Patient is here for COPD  Patient has taken medication today. Patient has eaten today.  Patient complains of bilateral lower leg pain. Pain is scaled at a 5 currently.

## 2016-07-11 NOTE — Telephone Encounter (Signed)
Patients prescription did not print during visit. Reprinted and placed at the front desk for pick up.

## 2016-07-11 NOTE — Telephone Encounter (Signed)
I spoke with the pharmacy. The medication will be cheaper at [facility]. Patient may pickup prescription on Tuesday when he meets with [place].

## 2016-07-13 ENCOUNTER — Telehealth: Payer: Self-pay | Admitting: Internal Medicine

## 2016-07-13 NOTE — Telephone Encounter (Signed)
Returning call from CMA in regards to lab results  Pt also states he has experienced some discrepancies with his medications that he would like to discuss

## 2016-07-13 NOTE — Telephone Encounter (Signed)
Medical Assistant left message on patient's home and cell voicemail. Voicemail states to give a call back to Andranik Jeune with [facility] at [phone].  

## 2016-07-13 NOTE — Telephone Encounter (Signed)
-----   Message from Quentin Angstlugbemiga E Jegede, MD sent at 07/13/2016 12:16 PM EST ----- Lab result mostly stable when compared with previous results, Triglyceride still high, thyroid function improving. Adhere with cholesterol medicine and please limit saturated fat to no more than 7% of your calories, limit cholesterol to 200 mg/day, increase fiber and exercise as tolerated. If needed we may add another cholesterol lowering medication to your regimen.

## 2016-07-16 NOTE — Telephone Encounter (Signed)
Patient verified DOB Patient is aware of triglycerides being elevated and thyroid function improving. Patient advised to take cholesterol lowering medication as prescribed and limit saturated fat intake.  Patient advised to increase fiber and exercise as tolerated. Patient is aware of another lowering medication being added if levels do not stabilize. Patient is also aware of tylenol 3 prescription being placed at the front for pickup. Patient expressed his understanding and had no further questions.

## 2016-07-26 ENCOUNTER — Other Ambulatory Visit: Payer: Self-pay | Admitting: *Deleted

## 2016-07-26 DIAGNOSIS — G894 Chronic pain syndrome: Secondary | ICD-10-CM

## 2016-07-26 DIAGNOSIS — E039 Hypothyroidism, unspecified: Secondary | ICD-10-CM

## 2016-07-26 DIAGNOSIS — J449 Chronic obstructive pulmonary disease, unspecified: Secondary | ICD-10-CM

## 2016-07-26 MED ORDER — PREGABALIN 75 MG PO CAPS: 75.0000 mg | ORAL_CAPSULE | Freq: Two times a day (BID) | ORAL | 3 refills | Status: DC

## 2016-07-26 MED ORDER — DULOXETINE HCL 30 MG PO CPEP: 30.0000 mg | ORAL_CAPSULE | Freq: Every day | ORAL | 3 refills | Status: DC

## 2016-07-26 MED ORDER — LEVOTHYROXINE SODIUM 175 MCG PO TABS: 175.0000 ug | ORAL_TABLET | Freq: Every day | ORAL | 3 refills | Status: DC

## 2016-07-26 MED ORDER — ALBUTEROL SULFATE HFA 108 (90 BASE) MCG/ACT IN AERS: INHALATION_SPRAY | RESPIRATORY_TRACT | 3 refills | Status: DC

## 2016-07-26 NOTE — Telephone Encounter (Signed)
PRINTED FOR PASS PROGRAM 

## 2016-08-08 MED FILL — ?LEVOTHYROXINE 175 MCG TABL: 175 | 30 days supply | Qty: 30 | Fill #3

## 2016-08-08 MED FILL — ROSUVASTATIN CALCIUM 20 MG: 20 | 30 days supply | Qty: 30 | Fill #1

## 2016-08-08 MED FILL — DULoxetine HCL 30 MG CPEP: 30 | 30 days supply | Qty: 30 | Fill #1

## 2016-08-08 MED FILL — CYCLOBENZAPRINE 10 MG TAB: 10 | 10 days supply | Qty: 30 | Fill #3

## 2016-08-22 ENCOUNTER — Other Ambulatory Visit: Payer: Self-pay | Admitting: Internal Medicine

## 2016-08-22 MED FILL — CYCLOBENZAPRINE 10 MG TAB: 10 | 10 days supply | Qty: 30 | Fill #0

## 2016-08-29 ENCOUNTER — Ambulatory Visit: Payer: Self-pay | Attending: Internal Medicine | Admitting: Internal Medicine

## 2016-08-29 DIAGNOSIS — E785 Hyperlipidemia, unspecified: Secondary | ICD-10-CM | POA: Insufficient documentation

## 2016-08-29 DIAGNOSIS — Z79899 Other long term (current) drug therapy: Secondary | ICD-10-CM | POA: Insufficient documentation

## 2016-08-29 DIAGNOSIS — G894 Chronic pain syndrome: Secondary | ICD-10-CM | POA: Insufficient documentation

## 2016-08-29 DIAGNOSIS — E039 Hypothyroidism, unspecified: Secondary | ICD-10-CM | POA: Insufficient documentation

## 2016-08-29 DIAGNOSIS — M109 Gout, unspecified: Secondary | ICD-10-CM | POA: Insufficient documentation

## 2016-08-29 DIAGNOSIS — F411 Generalized anxiety disorder: Secondary | ICD-10-CM | POA: Insufficient documentation

## 2016-08-29 DIAGNOSIS — F331 Major depressive disorder, recurrent, moderate: Secondary | ICD-10-CM | POA: Insufficient documentation

## 2016-08-29 DIAGNOSIS — Z7982 Long term (current) use of aspirin: Secondary | ICD-10-CM | POA: Insufficient documentation

## 2016-08-29 DIAGNOSIS — F17210 Nicotine dependence, cigarettes, uncomplicated: Secondary | ICD-10-CM | POA: Insufficient documentation

## 2016-08-29 MED ORDER — PAROXETINE HCL 20 MG PO TABS: 20.0000 mg | ORAL_TABLET | Freq: Every day | ORAL | 3 refills | Status: DC

## 2016-08-29 MED ORDER — ACETAMINOPHEN-CODEINE #3 300-30 MG PO TABS: 1.0000 | ORAL_TABLET | Freq: Three times a day (TID) | ORAL | 0 refills | Status: DC | PRN

## 2016-08-29 MED ORDER — CLONAZEPAM 1 MG PO TABS: 1.0000 mg | ORAL_TABLET | Freq: Every day | ORAL | 0 refills | Status: DC

## 2016-08-29 MED FILL — PARoxetine HCL 20 MG TABS: 20 | 30 days supply | Qty: 30 | Fill #0

## 2016-08-29 MED FILL — ACETAMINOPHEN/COD #3 TABLET: 300-30 | 30 days supply | Qty: 90 | Fill #0

## 2016-08-29 NOTE — Progress Notes (Signed)
Paul Jefferson, is a 61 y.o. male  [id number]  [id number]  DOB - [birth year]   Subjective:   Paul Jefferson is a 61 y.o. male with history of major depression, hypothyroidism, generalized anxiety, gout and chronic pain syndrome heretodayfor a follow up visitand medication refill. He has no new complaint today, basically needs refill of his controlled prescriptions. He continues to smoke at least half a pack of cigarette per day. Patient has No headache, No chest pain, No abdominal pain - No Nausea, No new weakness tingling or numbness, No Cough - SOB.  Problem  Moderate Episode of Recurrent Major Depressive Disorder (Hcc)   ALLERGIES: Allergies  Allergen Reactions  . Mobic [Meloxicam] Nausea Only   PAST MEDICAL HISTORY: Past Medical History:  Diagnosis Date  . Depression   . Gout   . Neuromuscular disorder (HCC)   . Thyroid disease    MEDICATIONS AT HOME: Prior to Admission medications   Medication Sig Start Date End Date Taking? Authorizing Provider  acetaminophen-codeine (TYLENOL #3) 300-30 MG tablet Take 1 tablet by mouth every 8 (eight) hours as needed. 08/29/16   Paul Angst, MD  albuterol (VENTOLIN HFA) 108 (90 Base) MCG/ACT inhaler INHALE 2 PUFFS INTO THE LUNGS EVERY 6 HOURS AS NEEDED FOR WHEEZING OR SHORTNESS OF BREATH. 07/26/16   Paul Angst, MD  allopurinol (ZYLOPRIM) 100 MG tablet Take 1 tablet (100 mg total) by mouth daily. 05/04/16   Paul Angst, MD  aspirin EC 81 MG tablet Take 1 tablet (81 mg total) by mouth daily. 04/16/14   Paul Pia, MD  clonazePAM (KLONOPIN) 1 MG tablet Take 1 tablet (1 mg total) by mouth at bedtime. 08/29/16   Paul Angst, MD  colchicine 0.6 MG tablet Take 1 tablet (0.6 mg total) by mouth daily. 07/11/16   Paul Angst, MD  cyclobenzaprine (FLEXERIL) 10 MG tablet TAKE 1 TABLET BY MOUTH 3 TIMES DAILY AS NEEDED FOR MUSCLE SPASMS. 08/22/16   Paul Angst, MD  levothyroxine (SYNTHROID, LEVOTHROID)  175 MCG tablet Take 1 tablet (175 mcg total) by mouth daily before breakfast. 07/26/16   Paul Angst, MD  PARoxetine (PAXIL) 20 MG tablet Take 1 tablet (20 mg total) by mouth daily. 08/29/16   Paul Angst, MD  pregabalin (LYRICA) 75 MG capsule Take 1 capsule (75 mg total) by mouth 2 (two) times daily. 07/26/16   Paul Angst, MD  rosuvastatin (CRESTOR) 20 MG tablet Take 1 tablet (20 mg total) by mouth daily. 07/11/16   Paul Angst, MD   Objective:  There were no vitals filed for this visit. Exam General appearance : Awake, alert, not in any distress. Speech Clear. Not toxic looking, obese HEENT: Atraumatic and Normocephalic, pupils equally reactive to light and accomodation Neck: Supple, no JVD. No cervical lymphadenopathy.  Chest: Good air entry bilaterally, no added sounds  CVS: S1 S2 regular, no murmurs.  Abdomen: Bowel sounds present, Non tender and not distended with no gaurding, rigidity or rebound. Extremities: B/L Lower Ext shows no edema, both legs are warm to touch Neurology: Awake alert, and oriented X 3, CN II-XII intact, Non focal Skin: No Rash  Data Review Lab Results  Component Value Date   HGBA1C 5.5 05/03/2016   HGBA1C 5.2 05/06/2014   Assessment & Plan   1. Generalized anxiety disorder  - clonazePAM (KLONOPIN) 1 MG tablet; Take 1 tablet (1 mg total) by mouth at bedtime.  Dispense: 30 tablet; Refill: 0  2. Chronic pain  syndrome  - acetaminophen-codeine (TYLENOL #3) 300-30 MG tablet; Take 1 tablet by mouth every 8 (eight) hours as needed.  Dispense: 90 tablet; Refill: 0  3. Dyslipidemia  To address this please limit saturated fat to no more than 7% of your calories, limit cholesterol to 200 mg/day, increase fiber and exercise as tolerated. If needed we may add another cholesterol lowering medication to your regimen.   4. Moderate episode of recurrent major depressive disorder (HCC)  - PARoxetine (PAXIL) 20 MG tablet; Take 1 tablet (20  mg total) by mouth daily.  Dispense: 30 tablet; Refill: 3  Patient have been counseled extensively about nutrition and exercise. Other issues discussed during this visit include: low cholesterol diet, weight control and daily exercise, importance of adherence with medications and regular follow-up.   Return in about 6 months (around 03/01/2017) for Follow up Pain and comorbidities, Generalized Anxiety Disorder.  The patient was given clear instructions to go to ER or return to medical center if symptoms don't improve, worsen or new problems develop. The patient verbalized understanding. The patient was told to call to get lab results if they haven't heard anything in the next week.   This note has been created with Education officer, environmentalDragon speech recognition software and smart phrase technology. Any transcriptional errors are unintentional.    Paul LewandowskyJEGEDE, Paul Savannah, MD, MHA, FACP, FAAP, CPE [facility], [place] [phone]   08/29/2016, 2:52 PM

## 2016-08-29 NOTE — Patient Instructions (Signed)
Generalized Anxiety Disorder, Adult Generalized anxiety disorder (GAD) is a mental health disorder. People with this condition constantly worry about everyday events. Unlike normal anxiety, worry related to GAD is not triggered by a specific event. These worries also do not fade or get better with time. GAD interferes with life functions, including relationships, work, and school. GAD can vary from mild to severe. People with severe GAD can have intense waves of anxiety with physical symptoms (panic attacks). What are the causes? The exact cause of GAD is not known. What increases the risk? This condition is more likely to develop in:  Women.  People who have a family history of anxiety disorders.  People who are very shy.  People who experience very stressful life events, such as the death of a loved one.  People who have a very stressful family environment. What are the signs or symptoms? People with GAD often worry excessively about many things in their lives, such as their health and family. They may also be overly concerned about:  Doing well at work.  Being on time.  Natural disasters.  Friendships. Physical symptoms of GAD include:  Fatigue.  Muscle tension or having muscle twitches.  Trembling or feeling shaky.  Being easily startled.  Feeling like your heart is pounding or racing.  Feeling out of breath or like you cannot take a deep breath.  Having trouble falling asleep or staying asleep.  Sweating.  Nausea, diarrhea, or irritable bowel syndrome (IBS).  Headaches.  Trouble concentrating or remembering facts.  Restlessness.  Irritability. How is this diagnosed? Your health care provider can diagnose GAD based on your symptoms and medical history. You will also have a physical exam. The health care provider will ask specific questions about your symptoms, including how severe they are, when they started, and if they come and go. Your health care  provider may ask you about your use of alcohol or drugs, including prescription medicines. Your health care provider may refer you to a mental health specialist for further evaluation. Your health care provider will do a thorough examination and may perform additional tests to rule out other possible causes of your symptoms. To be diagnosed with GAD, a person must have anxiety that:  Is out of his or her control.  Affects several different aspects of his or her life, such as work and relationships.  Causes distress that makes him or her unable to take part in normal activities.  Includes at least three physical symptoms of GAD, such as restlessness, fatigue, trouble concentrating, irritability, muscle tension, or sleep problems. Before your health care provider can confirm a diagnosis of GAD, these symptoms must be present more days than they are not, and they must last for six months or longer. How is this treated? The following therapies are usually used to treat GAD:  Medicine. Antidepressant medicine is usually prescribed for long-term daily control. Antianxiety medicines may be added in severe cases, especially when panic attacks occur.  Talk therapy (psychotherapy). Certain types of talk therapy can be helpful in treating GAD by providing support, education, and guidance. Options include:  Cognitive behavioral therapy (CBT). People learn coping skills and techniques to ease their anxiety. They learn to identify unrealistic or negative thoughts and behaviors and to replace them with positive ones.  Acceptance and commitment therapy (ACT). This treatment teaches people how to be mindful as a way to cope with unwanted thoughts and feelings.  Biofeedback. This process trains you to manage your body's response (  physiological response) through breathing techniques and relaxation methods. You will work with a therapist while machines are used to monitor your physical symptoms.  Stress  management techniques. These include yoga, meditation, and exercise. A mental health specialist can help determine which treatment is best for you. Some people see improvement with one type of therapy. However, other people require a combination of therapies. Follow these instructions at home:  Take over-the-counter and prescription medicines only as told by your health care provider.  Try to maintain a normal routine.  Try to anticipate stressful situations and allow extra time to manage them.  Practice any stress management or self-calming techniques as taught by your health care provider.  Do not punish yourself for setbacks or for not making progress.  Try to recognize your accomplishments, even if they are small.  Keep all follow-up visits as told by your health care provider. This is important. Contact a health care provider if:  Your symptoms do not get better.  Your symptoms get worse.  You have signs of depression, such as:  A persistently sad, cranky, or irritable mood.  Loss of enjoyment in activities that used to bring you joy.  Change in weight or eating.  Changes in sleeping habits.  Avoiding friends or family members.  Loss of energy for normal tasks.  Feelings of guilt or worthlessness. Get help right away if:  You have serious thoughts about hurting yourself or others. If you ever feel like you may hurt yourself or others, or have thoughts about taking your own life, get help right away. You can go to your nearest emergency department or call:  Your local emergency services (911 in the [place]  A suicide crisis helpline, such as the National Suicide Prevention Lifeline at 660-753-7754. This is open 24 hours a day. Summary  Generalized anxiety disorder (GAD) is a mental health disorder that involves worry that is not triggered by a specific event.  People with GAD often worry excessively about many things in their lives, such as their health and  family.  GAD may cause physical symptoms such as restlessness, trouble concentrating, sleep problems, frequent sweating, nausea, diarrhea, headaches, and trembling or muscle twitching.  A mental health specialist can help determine which treatment is best for you. Some people see improvement with one type of therapy. However, other people require a combination of therapies. This information is not intended to replace advice given to you by your health care provider. Make sure you discuss any questions you have with your health care provider. Document Released: 09/15/2012 Document Revised: 04/10/2016 Document Reviewed: 04/10/2016 Elsevier Interactive Patient Education  2017 Elsevier Inc. Persistent Depressive Disorder Persistent depressive disorder (PDD) is a mental health condition. PDD causes symptoms of low-level depression for 2 years or longer. It may also be called long-term (chronic) depression or dysthymia. PDD may include episodes of more severe depression that last for about 2 weeks (major depressive disorder or MDD). PDD can affect the way you think, feel, and sleep. This condition may also affect your relationships. You may be more likely to get sick if you have PDD. Symptoms of PDD occur for most of the day and may include:  Feeling tired (fatigue).  Low energy.  Eating too much or too little.  Sleeping too much or too little.  Feeling restless or agitated.  Feeling hopeless.  Feeling worthless or guilty.  Feeling worried or nervous (anxiety).  Trouble concentrating or making decisions.  Low self-esteem.  A negative way of looking  at things (outlook).  Not being able to have fun or feel pleasure.  Avoiding interacting with people.  Getting angry or annoyed easily (irritability).  Acting aggressive or angry. Follow these instructions at home: Activity   Go back to your normal activities as told by your doctor.  Exercise regularly as told by your  doctor. General instructions   Take over-the-counter and prescription medicines only as told by your doctor.  Do not drink alcohol. Or, limit how much alcohol you drink to no more than 1 drink a day for nonpregnant women and 2 drinks a day for men. One drink equals 12 oz of beer, 5 oz of wine, or 1 oz of hard liquor. Alcohol can affect any antidepressant medicines you are taking. Talk with your doctor about your alcohol use.  Eat a healthy diet and get plenty of sleep.  Find activities that you enjoy each day.  Consider joining a support group. Your doctor may be able to suggest a support group.  Keep all follow-up visits as told by your doctor. This is important. Where to find more information: The [employer] on Mental Illness  www.nami.org [place]. [employer] of Mental Health  http://www.maynard.net/www.nimh.nih.gov National Suicide Prevention Lifeline  (808)134-01461-800-273-TALK 684-633-7774(1-309-295-7206). This is free, 24-hour help. Contact a doctor if:  Your symptoms get worse.  You have new symptoms.  You have trouble sleeping or doing your daily activities. Get help right away if:  You self-harm.  You have serious thoughts about hurting yourself or others.  You see, hear, taste, smell, or feel things that are not there (hallucinate). This information is not intended to replace advice given to you by your health care provider. Make sure you discuss any questions you have with your health care provider. Document Released: 05/02/2015 Document Revised: 01/13/2016 Document Reviewed: 01/13/2016 Elsevier Interactive Patient Education  2017 [employer]

## 2016-09-03 MED FILL — $SYNTHROID 175 MCG TABLET: 175 | 30 days supply | Qty: 30 | Fill #4

## 2016-09-19 MED FILL — CYCLOBENZAPRINE 10 MG TAB: 10 | 10 days supply | Qty: 30 | Fill #1

## 2016-10-08 MED FILL — CYCLOBENZAPRINE 10 MG TAB: 10 | 10 days supply | Qty: 30 | Fill #2

## 2016-10-08 MED FILL — $SYNTHROID 175 MCG TABLET: 175 | 30 days supply | Qty: 30 | Fill #5

## 2016-10-11 ENCOUNTER — Telehealth: Payer: Self-pay | Admitting: Internal Medicine

## 2016-10-11 NOTE — Telephone Encounter (Signed)
PT came to the office requesting a refill for the follow med. acetaminophen-codeine (TYLENOL #3) 300-30 MG tablet  clonazePAM (KLONOPIN) 1 MG tablet  Please follow up with PT

## 2016-10-17 ENCOUNTER — Telehealth: Payer: Self-pay | Admitting: Internal Medicine

## 2016-10-17 ENCOUNTER — Other Ambulatory Visit: Payer: Self-pay | Admitting: Internal Medicine

## 2016-10-17 DIAGNOSIS — F411 Generalized anxiety disorder: Secondary | ICD-10-CM

## 2016-10-17 DIAGNOSIS — G894 Chronic pain syndrome: Secondary | ICD-10-CM

## 2016-10-17 MED ORDER — ACETAMINOPHEN-CODEINE #3 300-30 MG PO TABS: 1.0000 | ORAL_TABLET | Freq: Three times a day (TID) | ORAL | 0 refills | Status: DC | PRN

## 2016-10-17 MED ORDER — CLONAZEPAM 1 MG PO TABS: 1.0000 mg | ORAL_TABLET | Freq: Every day | ORAL | 0 refills | Status: DC

## 2016-10-17 NOTE — Telephone Encounter (Signed)
Pt requesting refill Tylenol #3 and Klonopin. Please f/u. Thank you.

## 2016-10-18 MED FILL — ACETAMINOPHEN/COD #3 TABLET: 300-30 | 30 days supply | Qty: 90 | Fill #0

## 2016-11-07 ENCOUNTER — Other Ambulatory Visit: Payer: Self-pay | Admitting: Internal Medicine

## 2016-11-07 MED FILL — ?CYCLOBENZAPRINE 10 MG TABL: 10 | 10 days supply | Qty: 30 | Fill #3

## 2016-11-07 MED FILL — $SYNTHROID 175 MCG TABLET: 175 | 30 days supply | Qty: 30 | Fill #6

## 2016-11-12 MED FILL — $VENTOLIN HFA 18G INHALER: 108 (90 BAS | 30 days supply | Qty: 18 | Fill #0

## 2016-11-21 ENCOUNTER — Telehealth: Payer: Self-pay | Admitting: Internal Medicine

## 2016-11-21 NOTE — Telephone Encounter (Signed)
Pt calling to request a refill on Flexeril and Klonopin. Please f/u. Thank you.

## 2016-11-22 NOTE — Telephone Encounter (Signed)
Pt called again requesting flexeril and kolonpin. Advised pt of note sent 11/21/16. Pt will call back later this afternoon to verify script ready for pickup.

## 2016-11-23 ENCOUNTER — Other Ambulatory Visit: Payer: Self-pay | Admitting: Internal Medicine

## 2016-11-23 DIAGNOSIS — F411 Generalized anxiety disorder: Secondary | ICD-10-CM

## 2016-11-23 DIAGNOSIS — G894 Chronic pain syndrome: Secondary | ICD-10-CM

## 2016-11-23 MED ORDER — CYCLOBENZAPRINE HCL 10 MG PO TABS: ORAL_TABLET | ORAL | 3 refills | Status: DC

## 2016-11-23 MED ORDER — ACETAMINOPHEN-CODEINE #3 300-30 MG PO TABS: 1.0000 | ORAL_TABLET | Freq: Three times a day (TID) | ORAL | 0 refills | Status: DC | PRN

## 2016-11-23 MED ORDER — CLONAZEPAM 1 MG PO TABS: 1.0000 mg | ORAL_TABLET | Freq: Every day | ORAL | 0 refills | Status: DC

## 2016-11-23 MED FILL — CYCLOBENZAPRINE 10 MG TAB: 10 | 10 days supply | Qty: 30 | Fill #0

## 2016-11-23 MED FILL — ACETAMINOPHEN/COD #3 TABLET: 300-30 | 30 days supply | Qty: 90 | Fill #0

## 2016-12-19 ENCOUNTER — Other Ambulatory Visit: Payer: Self-pay | Admitting: Internal Medicine

## 2016-12-19 DIAGNOSIS — F411 Generalized anxiety disorder: Secondary | ICD-10-CM

## 2016-12-19 MED ORDER — CYCLOBENZAPRINE HCL 10 MG PO TABS: ORAL_TABLET | ORAL | 3 refills | Status: DC

## 2016-12-19 MED ORDER — CLONAZEPAM 1 MG PO TABS: 1.0000 mg | ORAL_TABLET | Freq: Every day | ORAL | 0 refills | Status: DC

## 2016-12-19 MED FILL — $SYNTHROID 175 MCG TABLET: 175 | 30 days supply | Qty: 30 | Fill #7

## 2016-12-19 NOTE — Telephone Encounter (Signed)
Last filled on 11/23/16.

## 2016-12-19 NOTE — Telephone Encounter (Signed)
Pt requesting refill for Klonopin and Felexeril. Please f/u with pt. Thank you.

## 2016-12-20 MED FILL — ?CYCLOBENZAPRINE 10 MG TABL: 10 | 10 days supply | Qty: 30 | Fill #0

## 2017-01-09 MED FILL — ?CYCLOBENZAPRINE 10 MG TABL: 10 | 10 days supply | Qty: 30 | Fill #1

## 2017-01-14 MED FILL — $SYNTHROID 175 MCG TABLET: 175 | 30 days supply | Qty: 30 | Fill #8

## 2017-01-15 ENCOUNTER — Telehealth: Payer: Self-pay | Admitting: Internal Medicine

## 2017-01-15 NOTE — Telephone Encounter (Signed)
Pt, called to request a refill  acetaminophen-codeine (TYLENOL #3) 300-30 MG tablet  clonazePAM (KLONOPIN) 1 MG tablet   If possible to have thee medication ready for tomorrow since he is coming to get more med from the pharmacy, please follow up

## 2017-01-16 ENCOUNTER — Other Ambulatory Visit: Payer: Self-pay | Admitting: Internal Medicine

## 2017-01-16 DIAGNOSIS — F411 Generalized anxiety disorder: Secondary | ICD-10-CM

## 2017-01-16 DIAGNOSIS — G894 Chronic pain syndrome: Secondary | ICD-10-CM

## 2017-01-16 MED ORDER — ACETAMINOPHEN-CODEINE #3 300-30 MG PO TABS: 1.0000 | ORAL_TABLET | Freq: Three times a day (TID) | ORAL | 0 refills | Status: DC | PRN

## 2017-01-16 MED ORDER — CLONAZEPAM 1 MG PO TABS: 1.0000 mg | ORAL_TABLET | Freq: Every day | ORAL | 0 refills | Status: DC

## 2017-01-16 NOTE — Telephone Encounter (Signed)
Patient refill request -

## 2017-01-17 MED FILL — ACETAMINOPHEN/COD #3 TABLET: 300-30 | 30 days supply | Qty: 90 | Fill #0

## 2017-02-18 ENCOUNTER — Encounter: Payer: Self-pay | Admitting: Internal Medicine

## 2017-02-18 ENCOUNTER — Ambulatory Visit: Payer: Self-pay | Attending: Internal Medicine | Admitting: Internal Medicine

## 2017-02-18 VITALS — BP 127/82 | HR 91 | Temp 97.8°F | Resp 18 | Ht 75.0 in | Wt 269.0 lb

## 2017-02-18 DIAGNOSIS — E039 Hypothyroidism, unspecified: Secondary | ICD-10-CM | POA: Insufficient documentation

## 2017-02-18 DIAGNOSIS — F17210 Nicotine dependence, cigarettes, uncomplicated: Secondary | ICD-10-CM | POA: Insufficient documentation

## 2017-02-18 DIAGNOSIS — M109 Gout, unspecified: Secondary | ICD-10-CM | POA: Insufficient documentation

## 2017-02-18 DIAGNOSIS — E785 Hyperlipidemia, unspecified: Secondary | ICD-10-CM | POA: Insufficient documentation

## 2017-02-18 DIAGNOSIS — Z23 Encounter for immunization: Secondary | ICD-10-CM

## 2017-02-18 DIAGNOSIS — F411 Generalized anxiety disorder: Secondary | ICD-10-CM | POA: Insufficient documentation

## 2017-02-18 DIAGNOSIS — F329 Major depressive disorder, single episode, unspecified: Secondary | ICD-10-CM | POA: Insufficient documentation

## 2017-02-18 DIAGNOSIS — Z888 Allergy status to other drugs, medicaments and biological substances status: Secondary | ICD-10-CM | POA: Insufficient documentation

## 2017-02-18 DIAGNOSIS — Z7982 Long term (current) use of aspirin: Secondary | ICD-10-CM | POA: Insufficient documentation

## 2017-02-18 DIAGNOSIS — N529 Male erectile dysfunction, unspecified: Secondary | ICD-10-CM | POA: Insufficient documentation

## 2017-02-18 DIAGNOSIS — G894 Chronic pain syndrome: Secondary | ICD-10-CM | POA: Insufficient documentation

## 2017-02-18 DIAGNOSIS — G709 Myoneural disorder, unspecified: Secondary | ICD-10-CM | POA: Insufficient documentation

## 2017-02-18 MED ORDER — SILDENAFIL CITRATE 50 MG PO TABS: 50.0000 mg | ORAL_TABLET | Freq: Every day | ORAL | 1 refills | Status: DC | PRN

## 2017-02-18 MED ORDER — ACETAMINOPHEN-CODEINE #3 300-30 MG PO TABS: 1.0000 | ORAL_TABLET | Freq: Three times a day (TID) | ORAL | 0 refills | Status: DC | PRN

## 2017-02-18 MED ORDER — CLONAZEPAM 1 MG PO TABS: 1.0000 mg | ORAL_TABLET | Freq: Every day | ORAL | 0 refills | Status: DC

## 2017-02-18 MED FILL — ACETAMINOPHEN/COD #3 TABLET: 300-30 | 30 days supply | Qty: 90 | Fill #0

## 2017-02-18 MED FILL — !VIAGRA 50 MG TABLET: 50 | 30 days supply | Qty: 5 | Fill #0

## 2017-02-18 NOTE — Progress Notes (Signed)
Paul Jefferson, is a 61 y.o. male  [id number]  [id number]  DOB - 1955/12/04  Chief Complaint  Patient presents with  . Follow-up      Subjective:   Paul Jefferson is a 61 y.o. male with history of major depression, hypothyroidism, generalized anxiety, gout and chronic pain syndrome heretodayfor a follow up visitand medication refill.His last TSH was high at 17.33 but free T4 was normal at 1.2, currently on levothyroxine 175 g by mouth daily. He denies any new symptoms of unusual cold or heat intolerance. He denies any tremor, no diarrhea or constipation. Patient claims adherence with his medications. He continues to smoke cigarettes about half a pack per day. He feels his depression is better controlled. He now has an energy for sexual encounter but he suffers erectile dysfunction, he would like to try Viagra. He continues to have anxiety but denies any panic attack. He requests a refill on his medications. Patient has No headache, No chest pain, No abdominal pain - No Nausea, No new weakness tingling or numbness, No Cough - SOB.  ALLERGIES: Allergies  Allergen Reactions  . Mobic [Meloxicam] Nausea Only   PAST MEDICAL HISTORY: Past Medical History:  Diagnosis Date  . Depression   . Gout   . Neuromuscular disorder ([place])   . Thyroid disease    MEDICATIONS AT HOME: Prior to Admission medications   Medication Sig Start Date End Date Taking? Authorizing Provider  acetaminophen-codeine (TYLENOL #3) 300-30 MG tablet Take 1 tablet by mouth every 8 (eight) hours as needed. 02/18/17  Yes Shayaan Parke E, MD  albuterol (VENTOLIN HFA) 108 (90 Base) MCG/ACT inhaler INHALE 2 PUFFS INTO THE LUNGS EVERY 6 HOURS AS NEEDED FOR WHEEZING OR SHORTNESS OF BREATH. 07/26/16  Yes Aeric Burnham E, MD  allopurinol (ZYLOPRIM) 100 MG tablet Take 1 tablet (100 mg total) by mouth daily. 05/04/16  Yes Tresa Garter, MD  aspirin EC 81 MG tablet Take 1 tablet (81 mg total) by mouth daily.  04/16/14  Yes Velvet Bathe, MD  clonazePAM (KLONOPIN) 1 MG tablet Take 1 tablet (1 mg total) by mouth at bedtime. 02/18/17  Yes Tresa Garter, MD  colchicine 0.6 MG tablet Take 1 tablet (0.6 mg total) by mouth daily. 07/11/16  Yes Arion Shankles E, MD  cyclobenzaprine (FLEXERIL) 10 MG tablet TAKE 1 TABLET BY MOUTH 3 TIMES DAILY AS NEEDED FOR MUSCLE SPASMS. 12/19/16  Yes Tresa Garter, MD  levothyroxine (SYNTHROID, LEVOTHROID) 175 MCG tablet Take 1 tablet (175 mcg total) by mouth daily before breakfast. 07/26/16  Yes Lilith Solana E, MD  PARoxetine (PAXIL) 20 MG tablet Take 1 tablet (20 mg total) by mouth daily. 08/29/16  Yes Tresa Garter, MD  pregabalin (LYRICA) 75 MG capsule Take 1 capsule (75 mg total) by mouth 2 (two) times daily. 07/26/16  Yes Tresa Garter, MD  rosuvastatin (CRESTOR) 20 MG tablet Take 1 tablet (20 mg total) by mouth daily. 07/11/16  Yes Tresa Garter, MD  sildenafil (VIAGRA) 50 MG tablet Take 1 tablet (50 mg total) by mouth daily as needed for erectile dysfunction. 02/18/17   Tresa Garter, MD   Objective:   Vitals:   02/18/17 1403  BP: 127/82  Pulse: 91  Resp: 18  Temp: 97.8 F (36.6 C)  TempSrc: Oral  SpO2: 95%  Weight: 269 lb (122 kg)  Height: 6' 3"  (1.905 m)   Exam General appearance : Awake, alert, not in any distress. Speech Clear. Not toxic looking, obese  HEENT: Atraumatic and Normocephalic, pupils equally reactive to light and accomodation Neck: Supple, no JVD. No cervical lymphadenopathy.  Chest: Good air entry bilaterally, no added sounds  CVS: S1 S2 regular, no murmurs.  Abdomen: Bowel sounds present, Non tender and not distended with no gaurding, rigidity or rebound. Extremities: B/L Lower Ext shows no edema, both legs are warm to touch Neurology: Awake alert, and oriented X 3, CN II-XII intact, Non focal Skin: No Rash  Data Review Lab Results  Component Value Date   HGBA1C 5.5 05/03/2016   HGBA1C 5.2  05/06/2014   Assessment & Plan   1. Chronic pain syndrome Refill - acetaminophen-codeine (TYLENOL #3) 300-30 MG tablet; Take 1 tablet by mouth every 8 (eight) hours as needed.  Dispense: 90 tablet; Refill: 0  2. Generalized anxiety disorder  - clonazePAM (KLONOPIN) 1 MG tablet; Take 1 tablet (1 mg total) by mouth at bedtime.  Dispense: 30 tablet; Refill: 0 - CMP14+EGFR - Lipid panel - TSH - T4, Free  3. Dyslipidemia  To address this please limit saturated fat to no more than 7% of your calories, limit cholesterol to 200 mg/day, increase fiber and exercise as tolerated. If needed we may add another cholesterol lowering medication to your regimen.   4. Erectile dysfunction, unspecified erectile dysfunction type  - sildenafil (VIAGRA) 50 MG tablet; Take 1 tablet (50 mg total) by mouth daily as needed for erectile dysfunction.  Dispense: 30 tablet; Refill: 1  Prynce was counseled on the dangers of tobacco use, and was advised to quit. Reviewed strategies to maximize success, including removing cigarettes and smoking materials from environment, stress management and support of family/friends.  Patient have been counseled extensively about nutrition and exercise. Other issues discussed during this visit include: low cholesterol diet, weight control and daily exercise.  Return in about 6 months (around 08/18/2017) for Follow up Pain and comorbidities, Generalized Anxiety Disorder.  The patient was given clear instructions to go to ER or return to medical center if symptoms don't improve, worsen or new problems develop. The patient verbalized understanding. The patient was told to call to get lab results if they haven't heard anything in the next week.   This note has been created with Surveyor, quantity. Any transcriptional errors are unintentional.    Angelica Chessman, MD, [place], [place], [place], [place] and [place], [place]   02/18/2017, 2:29 PM

## 2017-02-18 NOTE — Patient Instructions (Signed)
Hypothyroidism Hypothyroidism is a disorder of the thyroid. The thyroid is a large gland that is located in the lower front of the neck. The thyroid releases hormones that control how the body works. With hypothyroidism, the thyroid does not make enough of these hormones. What are the causes? Causes of hypothyroidism may include:  Viral infections.  Pregnancy.  Your own defense system (immune system) attacking your thyroid.  Certain medicines.  Birth defects.  Past radiation treatments to your head or neck.  Past treatment with radioactive iodine.  Past surgical removal of part or all of your thyroid.  Problems with the gland that is located in the center of your brain (pituitary).  What are the signs or symptoms? Signs and symptoms of hypothyroidism may include:  Feeling as though you have no energy (lethargy).  Inability to tolerate cold.  Weight gain that is not explained by a change in diet or exercise habits.  Dry skin.  Coarse hair.  Menstrual irregularity.  Slowing of thought processes.  Constipation.  Sadness or depression.  How is this diagnosed? Your health care provider may diagnose hypothyroidism with blood tests and ultrasound tests. How is this treated? Hypothyroidism is treated with medicine that replaces the hormones that your body does not make. After you begin treatment, it may take several weeks for symptoms to go away. Follow these instructions at home:  Take medicines only as directed by your health care provider.  If you start taking any new medicines, tell your health care provider.  Keep all follow-up visits as directed by your health care provider. This is important. As your condition improves, your dosage needs may change. You will need to have blood tests regularly so that your health care provider can watch your condition. Contact a health care provider if:  Your symptoms do not get better with treatment.  You are taking thyroid  replacement medicine and: ? You sweat excessively. ? You have tremors. ? You feel anxious. ? You lose weight rapidly. ? You cannot tolerate heat. ? You have emotional swings. ? You have diarrhea. ? You feel weak. Get help right away if:  You develop chest pain.  You develop an irregular heartbeat.  You develop a rapid heartbeat. This information is not intended to replace advice given to you by your health care provider. Make sure you discuss any questions you have with your health care provider. Document Released: 05/21/2005 Document Revised: 10/27/2015 Document Reviewed: 10/06/2013 Elsevier Interactive Patient Education  2017 Elsevier Inc. Generalized Anxiety Disorder, Adult Generalized anxiety disorder (GAD) is a mental health disorder. People with this condition constantly worry about everyday events. Unlike normal anxiety, worry related to GAD is not triggered by a specific event. These worries also do not fade or get better with time. GAD interferes with life functions, including relationships, work, and school. GAD can vary from mild to severe. People with severe GAD can have intense waves of anxiety with physical symptoms (panic attacks). What are the causes? The exact cause of GAD is not known. What increases the risk? This condition is more likely to develop in:  Women.  People who have a family history of anxiety disorders.  People who are very shy.  People who experience very stressful life events, such as the death of a loved one.  People who have a very stressful family environment.  What are the signs or symptoms? People with GAD often worry excessively about many things in their lives, such as their health and family. They  may also be overly concerned about:  Doing well at work.  Being on time.  Natural disasters.  Friendships.  Physical symptoms of GAD include:  Fatigue.  Muscle tension or having muscle twitches.  Trembling or feeling  shaky.  Being easily startled.  Feeling like your heart is pounding or racing.  Feeling out of breath or like you cannot take a deep breath.  Having trouble falling asleep or staying asleep.  Sweating.  Nausea, diarrhea, or irritable bowel syndrome (IBS).  Headaches.  Trouble concentrating or remembering facts.  Restlessness.  Irritability.  How is this diagnosed? Your health care provider can diagnose GAD based on your symptoms and medical history. You will also have a physical exam. The health care provider will ask specific questions about your symptoms, including how severe they are, when they started, and if they come and go. Your health care provider may ask you about your use of alcohol or drugs, including prescription medicines. Your health care provider may refer you to a mental health specialist for further evaluation. Your health care provider will do a thorough examination and may perform additional tests to rule out other possible causes of your symptoms. To be diagnosed with GAD, a person must have anxiety that:  Is out of his or her control.  Affects several different aspects of his or her life, such as work and relationships.  Causes distress that makes him or her unable to take part in normal activities.  Includes at least three physical symptoms of GAD, such as restlessness, fatigue, trouble concentrating, irritability, muscle tension, or sleep problems.  Before your health care provider can confirm a diagnosis of GAD, these symptoms must be present more days than they are not, and they must last for six months or longer. How is this treated? The following therapies are usually used to treat GAD:  Medicine. Antidepressant medicine is usually prescribed for long-term daily control. Antianxiety medicines may be added in severe cases, especially when panic attacks occur.  Talk therapy (psychotherapy). Certain types of talk therapy can be helpful in treating GAD  by providing support, education, and guidance. Options include: ? Cognitive behavioral therapy (CBT). People learn coping skills and techniques to ease their anxiety. They learn to identify unrealistic or negative thoughts and behaviors and to replace them with positive ones. ? Acceptance and commitment therapy (ACT). This treatment teaches people how to be mindful as a way to cope with unwanted thoughts and feelings. ? Biofeedback. This process trains you to manage your body's response (physiological response) through breathing techniques and relaxation methods. You will work with a therapist while machines are used to monitor your physical symptoms.  Stress management techniques. These include yoga, meditation, and exercise.  A mental health specialist can help determine which treatment is best for you. Some people see improvement with one type of therapy. However, other people require a combination of therapies. Follow these instructions at home:  Take over-the-counter and prescription medicines only as told by your health care provider.  Try to maintain a normal routine.  Try to anticipate stressful situations and allow extra time to manage them.  Practice any stress management or self-calming techniques as taught by your health care provider.  Do not punish yourself for setbacks or for not making progress.  Try to recognize your accomplishments, even if they are small.  Keep all follow-up visits as told by your health care provider. This is important. Contact a health care provider if:  Your symptoms do not  get better.  Your symptoms get worse.  You have signs of depression, such as: ? A persistently sad, cranky, or irritable mood. ? Loss of enjoyment in activities that used to bring you joy. ? Change in weight or eating. ? Changes in sleeping habits. ? Avoiding friends or family members. ? Loss of energy for normal tasks. ? Feelings of guilt or worthlessness. Get help right  away if:  You have serious thoughts about hurting yourself or others. If you ever feel like you may hurt yourself or others, or have thoughts about taking your own life, get help right away. You can go to your nearest emergency department or call:  Your local emergency services (911 in the [place]  A suicide crisis helpline, such as the National Suicide Prevention Lifeline at (904) 216-3628. This is open 24 hours a day.  Summary  Generalized anxiety disorder (GAD) is a mental health disorder that involves worry that is not triggered by a specific event.  People with GAD often worry excessively about many things in their lives, such as their health and family.  GAD may cause physical symptoms such as restlessness, trouble concentrating, sleep problems, frequent sweating, nausea, diarrhea, headaches, and trembling or muscle twitching.  A mental health specialist can help determine which treatment is best for you. Some people see improvement with one type of therapy. However, other people require a combination of therapies. This information is not intended to replace advice given to you by your health care provider. Make sure you discuss any questions you have with your health care provider. Document Released: 09/15/2012 Document Revised: 04/10/2016 Document Reviewed: 04/10/2016 Elsevier Interactive Patient Education  [employer].

## 2017-02-19 ENCOUNTER — Other Ambulatory Visit: Payer: Self-pay | Admitting: *Deleted

## 2017-02-19 DIAGNOSIS — G894 Chronic pain syndrome: Secondary | ICD-10-CM

## 2017-02-19 DIAGNOSIS — N529 Male erectile dysfunction, unspecified: Secondary | ICD-10-CM

## 2017-02-19 LAB — CMP14+EGFR
ALBUMIN: 4.3 g/dL (ref 3.6–4.8)
ALK PHOS: 137 IU/L — AB (ref 39–117)
ALT: 49 IU/L — ABNORMAL HIGH (ref 0–44)
AST: 55 IU/L — ABNORMAL HIGH (ref 0–40)
Albumin/Globulin Ratio: 1.2 (ref 1.2–2.2)
BILIRUBIN TOTAL: 0.4 mg/dL (ref 0.0–1.2)
BUN / CREAT RATIO: 11 (ref 10–24)
BUN: 10 mg/dL (ref 8–27)
CHLORIDE: 95 mmol/L — AB (ref 96–106)
CO2: 23 mmol/L (ref 20–29)
CREATININE: 0.87 mg/dL (ref 0.76–1.27)
Calcium: 10.4 mg/dL — ABNORMAL HIGH (ref 8.6–10.2)
GFR calc Af Amer: 107 mL/min/{1.73_m2} (ref >59)
GFR calc non Af Amer: 92 mL/min/{1.73_m2} (ref >59)
GLOBULIN, TOTAL: 3.5 g/dL (ref 1.5–4.5)
Glucose: 93 mg/dL (ref 65–99)
Potassium: 4.1 mmol/L (ref 3.5–5.2)
SODIUM: 136 mmol/L (ref 134–144)
TOTAL PROTEIN: 7.8 g/dL (ref 6.0–8.5)

## 2017-02-19 LAB — LIPID PANEL
CHOLESTEROL TOTAL: 191 mg/dL (ref 100–199)
Chol/HDL Ratio: 5.6 ratio — ABNORMAL HIGH (ref 0.0–5.0)
HDL: 34 mg/dL — AB (ref >39)
TRIGLYCERIDES: 553 mg/dL — AB (ref 0–149)

## 2017-02-19 LAB — TSH: TSH: 12.27 u[IU]/mL — ABNORMAL HIGH (ref 0.450–4.500)

## 2017-02-19 LAB — T4, FREE: Free T4: 1.13 ng/dL (ref 0.82–1.77)

## 2017-02-19 MED ORDER — PREGABALIN 75 MG PO CAPS: 75.0000 mg | ORAL_CAPSULE | Freq: Two times a day (BID) | ORAL | 3 refills | Status: DC

## 2017-02-19 MED ORDER — GABAPENTIN 100 MG PO CAPS: 100.0000 mg | ORAL_CAPSULE | Freq: Three times a day (TID) | ORAL | 3 refills | Status: DC

## 2017-02-19 MED ORDER — SILDENAFIL CITRATE 50 MG PO TABS: 50.0000 mg | ORAL_TABLET | Freq: Every day | ORAL | 3 refills | Status: DC | PRN

## 2017-02-19 MED FILL — $SYNTHROID 175 MCG TABLET: 175 | 30 days supply | Qty: 30 | Fill #9

## 2017-02-19 MED FILL — CYCLOBENZAPRINE 10 MG TAB: 10 | 10 days supply | Qty: 30 | Fill #2

## 2017-02-19 NOTE — Telephone Encounter (Signed)
PRINTED FOR PASS PROGRAM 

## 2017-02-20 ENCOUNTER — Ambulatory Visit: Payer: Self-pay | Admitting: Internal Medicine

## 2017-02-20 MED FILL — GABAPENTIN 100 MG CAPSULE: 100 | 30 days supply | Qty: 90 | Fill #0

## 2017-02-21 ENCOUNTER — Other Ambulatory Visit: Payer: Self-pay | Admitting: Internal Medicine

## 2017-02-21 MED ORDER — FENOFIBRATE 145 MG PO TABS: 145.0000 mg | ORAL_TABLET | Freq: Every day | ORAL | 3 refills | Status: DC

## 2017-02-21 MED FILL — ?FENOFIBRATE 145 MG TABS: 145 | 30 days supply | Qty: 30 | Fill #0

## 2017-02-28 ENCOUNTER — Telehealth: Payer: Self-pay | Admitting: *Deleted

## 2017-02-28 NOTE — Telephone Encounter (Signed)
Medical Assistant left message on patient's home and cell voicemail. Voicemail states to give a call back to Nubia with [facility] at [phone].  

## 2017-02-28 NOTE — Telephone Encounter (Signed)
-----   Message from Quentin Angst, MD sent at 02/21/2017  4:45 PM EDT ----- Please inform patient that his thyroid function is improving, continue current dose of synthroid. Triglyceride, a form of cholesterol is very high, we will add another medication to lower the triglyceride. Prescribed to the pharmacy for pick-up. Liver enzymes are slightly elevated, please avoid alcohol and excessive tylenol or tylenol containing medications. Also please limit saturated fat to no more than 7% of your calories, limit cholesterol to 200 mg/day, increase fiber and exercise as tolerated.

## 2017-03-01 ENCOUNTER — Telehealth: Payer: Self-pay | Admitting: Internal Medicine

## 2017-03-01 NOTE — Telephone Encounter (Signed)
Pt called back to review results, please f/up °

## 2017-03-01 NOTE — Telephone Encounter (Signed)
Patient verified DOB Patient is aware of thyroid improving and needing to continue current dosing. Patient is also aware of triglycerides being elevated and Tricor being added to his medications. Patient is advised to limit alcohol and ibuprofen/motrin/advil due to liver enzymes being slightly elevated. Patient also advised to limit saturated fat intake and increase fiber and exercise. No further questions at this time.

## 2017-03-15 MED FILL — CYCLOBENZAPRINE 10 MG TAB: 10 | 10 days supply | Qty: 30 | Fill #3

## 2017-03-15 MED FILL — $VENTOLIN HFA 18G INHALER: 108 (90 BAS | 30 days supply | Qty: 18 | Fill #1

## 2017-03-18 ENCOUNTER — Telehealth: Payer: Self-pay | Admitting: Internal Medicine

## 2017-03-18 NOTE — Telephone Encounter (Signed)
Pt called to request a refill for his clonazePAM (KLONOPIN) 1 MG tablet   Please follow with pt

## 2017-03-19 ENCOUNTER — Telehealth: Payer: Self-pay | Admitting: Internal Medicine

## 2017-03-19 ENCOUNTER — Other Ambulatory Visit: Payer: Self-pay | Admitting: *Deleted

## 2017-03-19 DIAGNOSIS — F411 Generalized anxiety disorder: Secondary | ICD-10-CM

## 2017-03-19 MED FILL — $Viagra 50mg tablet: 50 | 30 days supply | Qty: 10 | Fill #0

## 2017-03-19 NOTE — Telephone Encounter (Signed)
Patient refill request -

## 2017-03-19 NOTE — Telephone Encounter (Signed)
Pt came to the office to drop, papers for pt assistance, papers will be at the inbox...please follow up

## 2017-03-20 ENCOUNTER — Other Ambulatory Visit: Payer: Self-pay | Admitting: Family Medicine

## 2017-03-20 DIAGNOSIS — F411 Generalized anxiety disorder: Secondary | ICD-10-CM

## 2017-03-20 MED ORDER — CLONAZEPAM 1 MG PO TABS: 1.0000 mg | ORAL_TABLET | Freq: Every day | ORAL | 0 refills | Status: DC

## 2017-03-21 MED FILL — $SYNTHROID 175 MCG TABLET: 175 | 30 days supply | Qty: 30 | Fill #10

## 2017-03-21 NOTE — Telephone Encounter (Signed)
Patient receives refills from PCP. PCP is out of the office and patient is requesting his monthly refill.

## 2017-03-25 NOTE — Telephone Encounter (Signed)
Klonopin refill request. Was denied by covering provider.

## 2017-03-25 NOTE — Telephone Encounter (Signed)
Pt still waiting for his prescription to be refill

## 2017-03-27 ENCOUNTER — Other Ambulatory Visit: Payer: Self-pay | Admitting: *Deleted

## 2017-03-27 DIAGNOSIS — F411 Generalized anxiety disorder: Secondary | ICD-10-CM

## 2017-03-27 DIAGNOSIS — G894 Chronic pain syndrome: Secondary | ICD-10-CM

## 2017-03-27 MED ORDER — ACETAMINOPHEN-CODEINE #3 300-30 MG PO TABS: 1.0000 | ORAL_TABLET | Freq: Three times a day (TID) | ORAL | 0 refills | Status: DC | PRN

## 2017-03-27 MED ORDER — CLONAZEPAM 1 MG PO TABS: 1.0000 mg | ORAL_TABLET | Freq: Every day | ORAL | 0 refills | Status: DC

## 2017-03-27 MED FILL — ACETAMINOPHEN/COD #3 TABLET: 300-30 | 30 days supply | Qty: 90 | Fill #0

## 2017-03-27 NOTE — Telephone Encounter (Signed)
PCP authorized 23 additional to equal 30

## 2017-04-22 ENCOUNTER — Other Ambulatory Visit: Payer: Self-pay | Admitting: Internal Medicine

## 2017-04-22 DIAGNOSIS — F411 Generalized anxiety disorder: Secondary | ICD-10-CM

## 2017-04-22 MED ORDER — CYCLOBENZAPRINE HCL 10 MG PO TABS: ORAL_TABLET | ORAL | 3 refills | Status: DC

## 2017-04-22 MED ORDER — CLONAZEPAM 1 MG PO TABS: 1.0000 mg | ORAL_TABLET | Freq: Every day | ORAL | 0 refills | Status: DC

## 2017-04-22 MED FILL — GABAPENTIN 100 MG CAPSULE: 100 | 30 days supply | Qty: 90 | Fill #1

## 2017-04-22 MED FILL — $SYNTHROID 175 MCG TABLET: 175 | 30 days supply | Qty: 30 | Fill #11

## 2017-04-22 MED FILL — ?CYCLOBENZAPRINE 10 MG TABL: 10 | 10 days supply | Qty: 30 | Fill #0

## 2017-04-22 NOTE — Telephone Encounter (Signed)
Pt. Called requesting a refill on the following medications:   cyclobenzaprine (FLEXERIL) 10 MG tablet  clonazePAM (KLONOPIN) 1 MG tablet   Please f/u with pt. Pt. [facility] pharmacy.

## 2017-04-22 NOTE — Telephone Encounter (Signed)
Refilled electronically 

## 2017-04-24 ENCOUNTER — Telehealth: Payer: Self-pay | Admitting: *Deleted

## 2017-04-24 ENCOUNTER — Other Ambulatory Visit: Payer: Self-pay | Admitting: *Deleted

## 2017-04-24 ENCOUNTER — Other Ambulatory Visit: Payer: Self-pay | Admitting: Internal Medicine

## 2017-04-24 DIAGNOSIS — F411 Generalized anxiety disorder: Secondary | ICD-10-CM

## 2017-04-24 NOTE — Telephone Encounter (Signed)
Pt called from pharmacy to have medication sent. [facility] [address], that medication was sent to pharmacy today,  electronically at 1537 by Dr. Hyman HopesJegede.  She stated she could take a verbal order for the medication: Since medication was  approved by provider, delivered verbal order via telephone. clonazePAM (KLONOPIN) 1 MG tablet. NO Quantity: 30   NO REFILL.

## 2017-04-24 NOTE — Telephone Encounter (Signed)
Resent to outside pharmacy

## 2017-04-24 NOTE — Telephone Encounter (Signed)
Pt is at pharmacy and request medication: clonazePAM (KLONOPIN) 1 MG tablet   Spoke to TibbieJamey, Teacher, early years/preharmacist at [facility] on [address] [place]. She was informed that medication was sent electronically by provider at 1737.   Bridget HartshornJamey stated she could take a verbal order for the medication: Delivered verbal order: clonazePAM (KLONOPIN) 1 MG tablet HS Quantity #30  NO REFILL

## 2017-05-17 MED FILL — GABAPENTIN 100 MG CAPSULE: 100 | 30 days supply | Qty: 90 | Fill #2

## 2017-05-20 MED FILL — ?CYCLOBENZAPRINE 10 MG TABL: 10 | 10 days supply | Qty: 30 | Fill #1

## 2017-05-20 MED FILL — $Viagra 50mg tablet: 50 | 30 days supply | Qty: 10 | Fill #1

## 2017-05-20 MED FILL — $VENTOLIN HFA 18G INHALER: 108 (90 BAS | 30 days supply | Qty: 18 | Fill #2

## 2017-05-20 MED FILL — $SYNTHROID 175 MCG TABLET: 175 | 30 days supply | Qty: 30 | Fill #0

## 2017-05-21 ENCOUNTER — Other Ambulatory Visit: Payer: Self-pay | Admitting: Internal Medicine

## 2017-05-21 DIAGNOSIS — F411 Generalized anxiety disorder: Secondary | ICD-10-CM

## 2017-05-31 ENCOUNTER — Other Ambulatory Visit: Payer: Self-pay | Admitting: *Deleted

## 2017-05-31 DIAGNOSIS — M10049 Idiopathic gout, unspecified hand: Secondary | ICD-10-CM

## 2017-05-31 MED ORDER — COLCHICINE 0.6 MG PO TABS: 0.6000 mg | ORAL_TABLET | Freq: Every day | ORAL | 0 refills | Status: DC

## 2017-05-31 NOTE — Telephone Encounter (Signed)
PRINTED FOR PASS PROGRAM 

## 2017-06-10 MED FILL — ROSUVASTATIN CALCIUM 20 MG: 20 | 30 days supply | Qty: 30 | Fill #2

## 2017-06-10 MED FILL — CYCLOBENZAPRINE 10 MG TAB: 10 | 10 days supply | Qty: 30 | Fill #2

## 2017-06-13 MED FILL — $COLCRYS 0.6 MG TABLET: 0.6 | 90 days supply | Qty: 90 | Fill #0

## 2017-06-20 ENCOUNTER — Ambulatory Visit: Payer: Self-pay | Attending: Internal Medicine

## 2017-06-26 ENCOUNTER — Telehealth: Payer: Self-pay | Admitting: Pharmacist

## 2017-06-26 MED FILL — GABAPENTIN 100 MG CAPSULE: 100 | 30 days supply | Qty: 90 | Fill #3

## 2017-06-26 NOTE — Telephone Encounter (Signed)
[facility] [address] [facility]. Will forward to PCP

## 2017-06-27 ENCOUNTER — Other Ambulatory Visit: Payer: Self-pay | Admitting: *Deleted

## 2017-06-27 DIAGNOSIS — F411 Generalized anxiety disorder: Secondary | ICD-10-CM

## 2017-06-27 MED ORDER — CLONAZEPAM 1 MG PO TABS: 1.0000 mg | ORAL_TABLET | Freq: Every day | ORAL | 0 refills | Status: DC

## 2017-06-27 NOTE — Telephone Encounter (Signed)
Last filled 05/21/17. Refilled for 30 day supply per PCP

## 2017-07-08 MED FILL — ROSUVASTATIN CALCIUM 20 MG: 20 | 30 days supply | Qty: 30 | Fill #3

## 2017-07-09 MED FILL — CYCLOBENZAPRINE 10 MG TAB: 10 | 10 days supply | Qty: 30 | Fill #3

## 2017-07-10 MED FILL — $SYNTHROID 175 MCG TABLET: 175 | 90 days supply | Qty: 90 | Fill #1

## 2017-07-18 ENCOUNTER — Other Ambulatory Visit: Payer: Self-pay

## 2017-07-18 ENCOUNTER — Ambulatory Visit: Payer: Self-pay | Attending: Internal Medicine | Admitting: Physician Assistant

## 2017-07-18 VITALS — BP 145/86 | HR 74 | Resp 24 | Wt 270.0 lb

## 2017-07-18 DIAGNOSIS — S93402A Sprain of unspecified ligament of left ankle, initial encounter: Secondary | ICD-10-CM

## 2017-07-18 DIAGNOSIS — Z79890 Hormone replacement therapy (postmenopausal): Secondary | ICD-10-CM | POA: Insufficient documentation

## 2017-07-18 DIAGNOSIS — E079 Disorder of thyroid, unspecified: Secondary | ICD-10-CM | POA: Insufficient documentation

## 2017-07-18 DIAGNOSIS — Z7982 Long term (current) use of aspirin: Secondary | ICD-10-CM | POA: Insufficient documentation

## 2017-07-18 DIAGNOSIS — K429 Umbilical hernia without obstruction or gangrene: Secondary | ICD-10-CM

## 2017-07-18 DIAGNOSIS — F329 Major depressive disorder, single episode, unspecified: Secondary | ICD-10-CM | POA: Insufficient documentation

## 2017-07-18 DIAGNOSIS — Z79899 Other long term (current) drug therapy: Secondary | ICD-10-CM | POA: Insufficient documentation

## 2017-07-18 DIAGNOSIS — X58XXXA Exposure to other specified factors, initial encounter: Secondary | ICD-10-CM | POA: Insufficient documentation

## 2017-07-18 DIAGNOSIS — G894 Chronic pain syndrome: Secondary | ICD-10-CM

## 2017-07-18 DIAGNOSIS — Z886 Allergy status to analgesic agent status: Secondary | ICD-10-CM | POA: Insufficient documentation

## 2017-07-18 DIAGNOSIS — M109 Gout, unspecified: Secondary | ICD-10-CM | POA: Insufficient documentation

## 2017-07-18 DIAGNOSIS — M62838 Other muscle spasm: Secondary | ICD-10-CM

## 2017-07-18 MED ORDER — CYCLOBENZAPRINE HCL 10 MG PO TABS: ORAL_TABLET | ORAL | 3 refills | Status: DC

## 2017-07-18 MED ORDER — ACETAMINOPHEN-CODEINE #3 300-30 MG PO TABS: 1.0000 | ORAL_TABLET | Freq: Three times a day (TID) | ORAL | 0 refills | Status: DC | PRN

## 2017-07-18 MED FILL — ACETAMINOPHEN/COD #3 TABLET: 300-30 | 10 days supply | Qty: 30 | Fill #0

## 2017-07-18 MED FILL — ?CYCLOBENZAPRINE 10MG TB: 10 | 10 days supply | Qty: 30 | Fill #0

## 2017-07-18 NOTE — Progress Notes (Signed)
Patient ID: Paul Jefferson, male   DOB: 05-26-1956, 62 y.o.   MRN: [id number]   Paul Jefferson, is a 62 y.o. male  [id number]  [id number]  DOB - 05-26-1956  Subjective:  Chief Complaint and HPI: Paul GarfinkelJames Jefferson is a 62 y.o. male here today for sprain of L ankle about 3 days ago.  This is an injury to his already "bad" ankle where he previously had surgery.  He inverted his L ankle when coming down some stairs.  He is wearing along cam Dillavou he had leftover from a previous time.  He last had Tylenol #3 filled in November.    He is also requesting a RF on flexeril for chronic muscle spasms.  He is asking about increasing his dose of Clonazepam which I told him he will need to address with his PCP.    He also has an umbilical hernia which has been causing him discomfort on and off.    ROS:   Constitutional:  No f/c, No night sweats, No unexplained weight loss. EENT:  No vision changes, No blurry vision, No hearing changes. No mouth, throat, or ear problems.  Respiratory: No cough, No SOB Cardiac: No CP, no palpitations GI:  No abd pain, No N/V/D. GU: No Urinary s/sx Musculoskeletal: + L ankle pain Neuro: No headache, no dizziness, no motor weakness.  Skin: No rash Endocrine:  No polydipsia. No polyuria.  Psych: Denies SI/HI  No problems updated.  ALLERGIES: Allergies  Allergen Reactions  . Mobic [Meloxicam] Nausea Only    PAST MEDICAL HISTORY: Past Medical History:  Diagnosis Date  . Depression   . Gout   . Neuromuscular disorder (HCC)   . Thyroid disease     MEDICATIONS AT HOME: Prior to Admission medications   Medication Sig Start Date End Date Taking? Authorizing Provider  acetaminophen-codeine (TYLENOL #3) 300-30 MG tablet Take 1 tablet by mouth every 8 (eight) hours as needed. 07/18/17  Yes Chellsea Beckers M, PA-C  albuterol (VENTOLIN HFA) 108 (90 Base) MCG/ACT inhaler INHALE 2 PUFFS INTO THE LUNGS EVERY 6 HOURS AS NEEDED FOR WHEEZING OR SHORTNESS OF BREATH.  07/26/16  Yes Quentin AngstJegede, Olugbemiga E, MD  aspirin EC 81 MG tablet Take 1 tablet (81 mg total) by mouth daily. 04/16/14  Yes Penny PiaVega, [place], MD  clonazePAM (KLONOPIN) 1 MG tablet Take 1 tablet (1 mg total) by mouth at bedtime. 06/27/17  Yes Quentin AngstJegede, Olugbemiga E, MD  colchicine 0.6 MG tablet Take 1 tablet (0.6 mg total) by mouth daily. 05/31/17  Yes Newlin, Odette HornsEnobong, MD  cyclobenzaprine (FLEXERIL) 10 MG tablet TAKE 1 TABLET BY MOUTH 3 TIMES DAILY AS NEEDED FOR MUSCLE SPASMS. 07/18/17  Yes Anders SimmondsMcClung, Doyt Castellana M, PA-C  levothyroxine (SYNTHROID, LEVOTHROID) 175 MCG tablet Take 1 tablet (175 mcg total) by mouth daily before breakfast. 07/26/16  Yes Jegede, Olugbemiga E, MD  pregabalin (LYRICA) 75 MG capsule Take 1 capsule (75 mg total) by mouth 2 (two) times daily. 02/19/17  Yes Quentin AngstJegede, Olugbemiga E, MD  rosuvastatin (CRESTOR) 20 MG tablet Take 1 tablet (20 mg total) by mouth daily. 07/11/16  Yes Quentin AngstJegede, Olugbemiga E, MD  allopurinol (ZYLOPRIM) 100 MG tablet Take 1 tablet (100 mg total) by mouth daily. Patient not taking: Reported on 07/18/2017 05/04/16   Quentin AngstJegede, Olugbemiga E, MD  sildenafil (VIAGRA) 50 MG tablet Take 1 tablet (50 mg total) by mouth daily as needed for erectile dysfunction. Patient not taking: Reported on 07/18/2017 02/19/17   Quentin AngstJegede, Olugbemiga E, MD     Objective:  EXAM:   Vitals:   07/18/17 1401  BP: (!) 145/86  Pulse: 74  Resp: (!) 24  SpO2: 99%  Weight: 270 lb (122.5 kg)    General appearance : A&OX3. NAD. Non-toxic-appearing HEENT: Atraumatic and Normocephalic.  PERRLA. EOM intact.   Neck: supple, no JVD. No cervical lymphadenopathy. No thyromegaly Chest/Lungs:  Breathing-non-labored, Good air entry bilaterally, breath sounds normal without rales, rhonchi, or wheezing  CVS: S1 S2 regular, no murmurs, gallops, rubs  Abdomen: Bowel sounds present, Non tender and not distended with no gaurding, rigidity or rebound.  There is a 3 cm non tender and reducible umbilical hernia present with no  sign of strangulation Extremities: Bilateral Lower Ext shows no edema, both legs are warm to touch with = pulse throughout.  L ankle with no point TTP.  No obvious swelling or erythema.  ROM WNL.   Neurology:  CN II-XII grossly intact, Non focal.   Psych:  TP linear. J/I WNL. Normal speech. Appropriate eye contact and affect.  Skin:  No Rash  Data Review Lab Results  Component Value Date   HGBA1C 5.5 05/03/2016   HGBA1C 5.2 05/06/2014     Assessment & Plan   1. Muscle spasm - cyclobenzaprine (FLEXERIL) 10 MG tablet; TAKE 1 TABLET BY MOUTH 3 TIMES DAILY AS NEEDED FOR MUSCLE SPASMS.  Dispense: 30 tablet; Refill: 3  2. Chronic pain syndrome - acetaminophen-codeine (TYLENOL #3) 300-30 MG tablet; Take 1 tablet by mouth every 8 (eight) hours as needed.  Dispense: 30 tablet; Refill: 0  3. Sprain of left ankle, unspecified ligament, initial encounter - acetaminophen-codeine (TYLENOL #3) 300-30 MG tablet; Take 1 tablet by mouth every 8 (eight) hours as needed.  Dispense: 30 tablet; Refill: 0 Continue long cam Dement for comfort as needed for weight-bearing for A few days.  RICE therapy.  No xray indicated today.    4. Umbilical hernia without obstruction and without gangrene Non-acute/no strangulation - Ambulatory referral to General Surgery   Patient have been counseled extensively about nutrition and exercise  Return for keep appt with Dr Myrene Galas to establish care in March.  The patient was given clear instructions to go to ER or return to medical center if symptoms don't improve, worsen or new problems develop. The patient verbalized understanding. The patient was told to call to get lab results if they haven't heard anything in the next week.     Georgian Co, PA-C [facility] [place], [place] [phone]   07/18/2017, 2:27 PM

## 2017-07-18 NOTE — Progress Notes (Signed)
Discuss medications for pain and needs RF on some medications Clonazepam Tylenol #3   Possible hernia

## 2017-07-25 ENCOUNTER — Telehealth: Payer: Self-pay | Admitting: Internal Medicine

## 2017-07-25 NOTE — Telephone Encounter (Signed)
Patient stated he needed a refill on his clonazePAM (KLONOPIN) 1 MG tablet [[id number]][[id number]]  [facility] [address]

## 2017-07-26 ENCOUNTER — Other Ambulatory Visit: Payer: Self-pay | Admitting: Family Medicine

## 2017-07-26 ENCOUNTER — Telehealth: Payer: Self-pay | Admitting: Internal Medicine

## 2017-07-26 DIAGNOSIS — F411 Generalized anxiety disorder: Secondary | ICD-10-CM

## 2017-07-26 MED ORDER — CLONAZEPAM 1 MG PO TABS: 1.0000 mg | ORAL_TABLET | Freq: Every day | ORAL | 0 refills | Status: DC

## 2017-07-26 NOTE — Telephone Encounter (Signed)
Patient came in and asking for a refill on klonopin.

## 2017-07-29 ENCOUNTER — Other Ambulatory Visit: Payer: Self-pay | Admitting: Internal Medicine

## 2017-07-31 ENCOUNTER — Telehealth: Payer: Self-pay | Admitting: Internal Medicine

## 2017-07-31 NOTE — Telephone Encounter (Signed)
1 page, paperwork received by fax 07-31-17.

## 2017-08-06 MED FILL — CYCLOBENZAPRINE 10 MG TAB: 10 | 10 days supply | Qty: 30 | Fill #1

## 2017-08-08 ENCOUNTER — Encounter: Payer: Self-pay | Admitting: Family Medicine

## 2017-08-08 ENCOUNTER — Ambulatory Visit: Payer: Self-pay | Attending: Family Medicine | Admitting: Family Medicine

## 2017-08-08 VITALS — BP 150/76 | HR 87 | Temp 97.9°F | Ht 75.0 in | Wt 264.8 lb

## 2017-08-08 DIAGNOSIS — Z7982 Long term (current) use of aspirin: Secondary | ICD-10-CM | POA: Insufficient documentation

## 2017-08-08 DIAGNOSIS — E038 Other specified hypothyroidism: Secondary | ICD-10-CM

## 2017-08-08 DIAGNOSIS — Z79890 Hormone replacement therapy (postmenopausal): Secondary | ICD-10-CM | POA: Insufficient documentation

## 2017-08-08 DIAGNOSIS — G4709 Other insomnia: Secondary | ICD-10-CM

## 2017-08-08 DIAGNOSIS — G894 Chronic pain syndrome: Secondary | ICD-10-CM

## 2017-08-08 DIAGNOSIS — F411 Generalized anxiety disorder: Secondary | ICD-10-CM

## 2017-08-08 DIAGNOSIS — E785 Hyperlipidemia, unspecified: Secondary | ICD-10-CM | POA: Insufficient documentation

## 2017-08-08 DIAGNOSIS — Z79899 Other long term (current) drug therapy: Secondary | ICD-10-CM | POA: Insufficient documentation

## 2017-08-08 DIAGNOSIS — M25572 Pain in left ankle and joints of left foot: Secondary | ICD-10-CM

## 2017-08-08 DIAGNOSIS — F329 Major depressive disorder, single episode, unspecified: Secondary | ICD-10-CM | POA: Insufficient documentation

## 2017-08-08 DIAGNOSIS — E039 Hypothyroidism, unspecified: Secondary | ICD-10-CM | POA: Insufficient documentation

## 2017-08-08 DIAGNOSIS — G4700 Insomnia, unspecified: Secondary | ICD-10-CM | POA: Insufficient documentation

## 2017-08-08 DIAGNOSIS — I739 Peripheral vascular disease, unspecified: Secondary | ICD-10-CM

## 2017-08-08 DIAGNOSIS — M109 Gout, unspecified: Secondary | ICD-10-CM | POA: Insufficient documentation

## 2017-08-08 MED ORDER — CLONAZEPAM 0.5 MG PO TABS: 0.5000 mg | ORAL_TABLET | Freq: Every day | ORAL | 0 refills | Status: DC

## 2017-08-08 NOTE — Progress Notes (Signed)
Subjective:  Patient ID: Paul Jefferson, male    DOB: [birth year]  Age: 62 y.o. MRN: [id number]  CC: Establish Care and Hypothyroidism   HPI Paul Jefferson is a 62 year old male with a history of hypothyroidism, hyperlipidemia, neuropathy, gout, anxiety who presents today to establish care with me as he was previously followed by Dr Hyman HopesJegede. He "reinjured" his left ankle 3 days ago when he twisted it and reports improvement in the pain since his last visit 3 weeks ago with the physician assistant.  He had been taking Tylenol No. 3 along with his Lyrica and has a few pills of Tylenol No. 3 left but now uses Advil. Took off his Cam Payer recently and is using a brace and denies swelling. He informs me he has chronic pain in his feet from previous broken bones and neuropathy and pain is worse in the evening. He has not had a gout flares sinc in the last 2 months. Endorses intermittent claudication pain in both calf.  He is requesting Klonopin which he takes for anxiety and on further questioning informs me he has taken this for over 3 years and tried Zoloft and Paxil in the past which did not help. "I do not like changing medications" states.  Klonopin also helps him with sleep he states. [facility] but states they changed all the doctors and he did not continue his care there.  Doing well on levothyroxine.  Past Medical History:  Diagnosis Date  . Depression   . Gout   . Neuromuscular disorder (HCC)   . Thyroid disease     Past Surgical History:  Procedure Laterality Date  . CHOLECYSTECTOMY      Allergies  Allergen Reactions  . Mobic [Meloxicam] Nausea Only     Outpatient Medications Prior to Visit  Medication Sig Dispense Refill  . albuterol (VENTOLIN HFA) 108 (90 Base) MCG/ACT inhaler INHALE 2 PUFFS INTO THE LUNGS EVERY 6 HOURS AS NEEDED FOR WHEEZING OR SHORTNESS OF BREATH. 54 g 3  . aspirin EC 81 MG  tablet Take 1 tablet (81 mg total) by mouth daily. 30 tablet 0  . colchicine 0.6 MG tablet Take 1 tablet (0.6 mg total) by mouth daily. 90 tablet 0  . cyclobenzaprine (FLEXERIL) 10 MG tablet TAKE 1 TABLET BY MOUTH 3 TIMES DAILY AS NEEDED FOR MUSCLE SPASMS. 30 tablet 3  . levothyroxine (SYNTHROID, LEVOTHROID) 175 MCG tablet Take 1 tablet (175 mcg total) by mouth daily before breakfast. 90 tablet 3  . pregabalin (LYRICA) 75 MG capsule Take 1 capsule (75 mg total) by mouth 2 (two) times daily. 180 capsule 3  . rosuvastatin (CRESTOR) 20 MG tablet Take 1 tablet (20 mg total) by mouth daily. 90 tablet 3  . acetaminophen-codeine (TYLENOL #3) 300-30 MG tablet Take 1 tablet by mouth every 8 (eight) hours as needed. 30 tablet 0  . clonazePAM (KLONOPIN) 1 MG tablet Take 1 tablet (1 mg total) by mouth at bedtime. 15 tablet 0  . allopurinol (ZYLOPRIM) 100 MG tablet Take 1 tablet (100 mg total) by mouth daily. (Patient not taking: Reported on 07/18/2017) 90 tablet 3  . sildenafil (VIAGRA) 50 MG tablet Take 1 tablet (50 mg total) by mouth daily as needed for erectile dysfunction. (Patient not taking: Reported on 07/18/2017) 30 tablet 3   No facility-administered medications prior to visit.     ROS Review of Systems  Constitutional: Negative for activity change and  appetite change.  HENT: Negative for sinus pressure and sore throat.   Eyes: Negative for visual disturbance.  Respiratory: Negative for cough, chest tightness and shortness of breath.   Cardiovascular: Negative for chest pain and leg swelling.  Gastrointestinal: Negative for abdominal distention, abdominal pain, constipation and diarrhea.  Endocrine: Negative.   Genitourinary: Negative for dysuria.  Musculoskeletal:       See hpi  Skin: Negative for rash.  Allergic/Immunologic: Negative.   Neurological: Positive for numbness. Negative for weakness and light-headedness.  Psychiatric/Behavioral: Negative for dysphoric mood and suicidal ideas.        Positive for anxiety    Objective:  BP (!) 150/76   Pulse 87   Temp 97.9 F (36.6 C) (Oral)   Ht 6\' 3"  (1.905 m)   Wt 264 lb 12.8 oz (120.1 kg)   SpO2 95%   BMI 33.10 kg/m   BP/Weight 08/08/2017 07/18/2017 02/18/2017  Systolic BP 150 145 127  Diastolic BP 76 86 82  Wt. (Lbs) 264.8 270 269  BMI 33.1 33.75 33.62      Physical Exam  Constitutional: He is oriented to person, place, and time. He appears well-developed and well-nourished.  Cardiovascular: Normal rate and normal heart sounds.  No murmur heard. Absent dorsalis pedis and posterior tibialis bilaterally  Pulmonary/Chest: Effort normal and breath sounds normal. He has no wheezes. He has no rales. He exhibits no tenderness.  Abdominal: Soft. Bowel sounds are normal. He exhibits no distension and no mass. There is no tenderness.  Musculoskeletal: Normal range of motion.  Slight left ankle tenderness on eversion and inversion of the  left foot  Neurological: He is alert and oriented to person, place, and time.  Skin:  Purplish discoloration of the skin of both feet  Psychiatric: He has a normal mood and affect.     Assessment & Plan:   1. Generalized anxiety disorder Advised that Klonopin is not recommended for long-term management of anxiety I would rather he be placed on an SSRI which he is not open to at this time We have discussed the process of slowly tapering him off Klonopin (which he is nervous about) and I would initiate hydroxyzine at his next visit and also commence trazodone if he needs something to help with sleep 2 weeks In the event that he  insists on remaining on Klonopin I am happy to refer him to psych. - clonazePAM (KLONOPIN) 0.5 MG tablet; Take 1 tablet (0.5 mg total) by mouth at bedtime.  Dispense: 14 tablet; Refill: 0  2. Chronic pain syndrome Currently taking Lyrica and Advil Newly diagnosed PAD could also be contributing to his lower extremity pain  3. Other specified  hypothyroidism Controlled Continue levothyroxine  4. Acute left ankle pain No evidence of swelling on exam Continue brace  5. Other insomnia We will consider trazodone or Ambien at his next visit as he is tapered off Klonopin  6. Peripheral arterial disease (HCC) In office ABI revealed bilateral PAD Risk factor modification-advised to quit smoking Continue Crestor - Ambulatory referral to Vascular Surgery   Meds ordered this encounter  Medications  . clonazePAM (KLONOPIN) 0.5 MG tablet    Sig: Take 1 tablet (0.5 mg total) by mouth at bedtime.    Dispense:  14 tablet    Refill:  0    Discontinue previous dose    Follow-up: No Follow-up on file.   Hoy Register MD

## 2017-08-09 ENCOUNTER — Telehealth: Payer: Self-pay | Admitting: Family Medicine

## 2017-08-09 ENCOUNTER — Encounter: Payer: Self-pay | Admitting: *Deleted

## 2017-08-09 ENCOUNTER — Other Ambulatory Visit: Payer: Self-pay | Admitting: Internal Medicine

## 2017-08-09 DIAGNOSIS — M25571 Pain in right ankle and joints of right foot: Secondary | ICD-10-CM

## 2017-08-09 DIAGNOSIS — E785 Hyperlipidemia, unspecified: Secondary | ICD-10-CM

## 2017-08-09 LAB — POCT ABI - SCREENING FOR PILOT NO CHARGE: OTHER: POSITIVE

## 2017-08-09 MED FILL — ROSUVASTATIN CALCIUM 20 MG: 20 | 30 days supply | Qty: 30 | Fill #0
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
## 2017-08-13 ENCOUNTER — Telehealth: Payer: Self-pay

## 2017-08-13 NOTE — Telephone Encounter (Signed)
Met with the patient when he came to the clinic today to complete an application for assistance from [employer] [facility] [place].  The completed application was faxed to Trinda Pascal, Legal Aid - fax # [phone].  The patient was also interested in making an appointment with the [facility].  An appointment was scheduled for tomorrow - 08/14/17.

## 2017-08-13 NOTE — Telephone Encounter (Signed)
Call received from Ranee GosselinMaria Ramirez Perez with [employer] [place].  She said that she received the referral for assistance and has tried to reach the patient but his voicemail is full.  She noted that she will continue to try to contact him. Byrd HesselbachMaria stared her contact # [phone] for the patient to call her.   This information was shared with Jenel LucksJasmine Lewis, LCSW/ [facility] as she is scheduled to meet with the patient tomorrow/

## 2017-08-14 ENCOUNTER — Ambulatory Visit: Payer: Self-pay | Attending: Family Medicine | Admitting: Licensed Clinical Social Worker

## 2017-08-14 DIAGNOSIS — F419 Anxiety disorder, unspecified: Secondary | ICD-10-CM

## 2017-08-14 NOTE — BH Specialist Note (Addendum)
Integrated Behavioral Health Initial Visit  MRN: [id number] Name: Paul Jefferson  Number of Integrated Behavioral Health Clinician visits:: 1/6 Session Start time: 2:07 PM  Session End time: 2:47 PM Total time: 40 minutes  Type of Service: Integrated Behavioral Health- Individual/Family Interpretor:No. Interpretor Name and Language: N/A   Warm Hand Off Completed.       SUBJECTIVE: Paul Jefferson is a 62 y.o. male accompanied by self Patient was referred by self for depression and anxiety. Patient reports the following symptoms/concerns: feelings of sadness and worry, difficulty sleeping, low energy, feeling bad about self due to financial strain and chronic pain Duration of problem: Ongoing; Severity of problem: moderate  OBJECTIVE: Mood: Anxious and Affect: Appropriate Risk of harm to self or others: No plan to harm self or others  LIFE CONTEXT: Family and Social: Pt resides alone and receives limited support School/Work: Pt has not worked in 5 years. He is currently living off of a settlement from [employer]. Pt reports the funds are running out. He does not receive any public benefits Self-Care: Pt participates in medication management through PCP Life Changes: Pt has ongoing medical conditions, recently had a break-up with partner, and is experiencing financial strain.   GOALS ADDRESSED: Patient will: 1. Reduce symptoms of: anxiety and depression 2. Increase knowledge and/or ability of: coping skills  3. Demonstrate ability to: Increase adequate support systems for patient/family  INTERVENTIONS: Interventions utilized: Solution-Focused Strategies, Supportive Counseling, Psychoeducation and/or Health Education and Link to [facility]  Standardized Assessments completed: Not Needed  ASSESSMENT: Patient currently experiencing depression and anxiety triggered by ongoing medical conditions, financial strain, and a recent break-up with partner. He reports  feelings of sadness and worry, difficulty sleeping, low energy, feeling bad about self due to financial strain and chronic pain. Pt receives limited support.   Patient may benefit from psychoeducation and psychotherapy. LCSWA educated pt on correlation between one's physical and mental health, in addition, to how stress can negatively impact one's health. Therapeutic interventions were discussed to assist in decreasing symptoms and managing stress. He is not interested in psychotherapy at this time. LCSWA provided community resources for food insecurity and encouraged him to schedule appointment with financial counseling.  Call placed to Stanford BreedMaria Ramirez with Legal Aid. Ms. Derrell LollingRamirez completed intake with pt and recommended that he re-apply for disability due to pt not appealing denial within 60 days of receiving letter. Pt was provided different options on how to apply for disability and medicaid. He was [facility] to [facility]. Pt verbalized understanding and was appreciative for the information provided.   PLAN: 1. Follow up with behavioral health clinician on : Pt was encouraged to contact LCSWA if symptoms worsen or fail to improve to schedule behavioral appointments at [facility]. 2. Behavioral recommendations: LCSWA recommends that pt apply healthy coping skills discussed, comply with medication management, and follow up with legal aid's recommendations. Pt is encouraged to schedule follow up appointment with LCSWA 3. Referral(s): [facility] ([facility]) 4. "From scale of 1-10, how likely are you to follow plan?": 10/10  Bridgett LarssonJasmine D Hau Sanor, LCSW

## 2017-08-22 MED FILL — CYCLOBENZAPRINE 10 MG TAB: 10 | 10 days supply | Qty: 30 | Fill #2

## 2017-08-23 ENCOUNTER — Encounter: Payer: Self-pay | Admitting: Family Medicine

## 2017-08-23 ENCOUNTER — Ambulatory Visit: Payer: Self-pay | Attending: Family Medicine | Admitting: Family Medicine

## 2017-08-23 VITALS — BP 129/83 | HR 90 | Temp 98.1°F | Ht 75.0 in | Wt 264.0 lb

## 2017-08-23 DIAGNOSIS — Z888 Allergy status to other drugs, medicaments and biological substances status: Secondary | ICD-10-CM | POA: Insufficient documentation

## 2017-08-23 DIAGNOSIS — F411 Generalized anxiety disorder: Secondary | ICD-10-CM | POA: Insufficient documentation

## 2017-08-23 DIAGNOSIS — E785 Hyperlipidemia, unspecified: Secondary | ICD-10-CM | POA: Insufficient documentation

## 2017-08-23 DIAGNOSIS — F320 Major depressive disorder, single episode, mild: Secondary | ICD-10-CM

## 2017-08-23 DIAGNOSIS — F329 Major depressive disorder, single episode, unspecified: Secondary | ICD-10-CM | POA: Insufficient documentation

## 2017-08-23 DIAGNOSIS — I739 Peripheral vascular disease, unspecified: Secondary | ICD-10-CM | POA: Insufficient documentation

## 2017-08-23 DIAGNOSIS — F32A Depression, unspecified: Secondary | ICD-10-CM | POA: Insufficient documentation

## 2017-08-23 DIAGNOSIS — Z79899 Other long term (current) drug therapy: Secondary | ICD-10-CM | POA: Insufficient documentation

## 2017-08-23 DIAGNOSIS — Z79890 Hormone replacement therapy (postmenopausal): Secondary | ICD-10-CM | POA: Insufficient documentation

## 2017-08-23 DIAGNOSIS — G4700 Insomnia, unspecified: Secondary | ICD-10-CM | POA: Insufficient documentation

## 2017-08-23 DIAGNOSIS — E039 Hypothyroidism, unspecified: Secondary | ICD-10-CM | POA: Insufficient documentation

## 2017-08-23 DIAGNOSIS — Z7982 Long term (current) use of aspirin: Secondary | ICD-10-CM | POA: Insufficient documentation

## 2017-08-23 DIAGNOSIS — G4709 Other insomnia: Secondary | ICD-10-CM

## 2017-08-23 DIAGNOSIS — Z9049 Acquired absence of other specified parts of digestive tract: Secondary | ICD-10-CM | POA: Insufficient documentation

## 2017-08-23 MED ORDER — CLONAZEPAM 0.5 MG PO TABS: 0.5000 mg | ORAL_TABLET | Freq: Every evening | ORAL | 0 refills | Status: DC | PRN

## 2017-08-23 MED ORDER — ZOLPIDEM TARTRATE 5 MG PO TABS: 5.0000 mg | ORAL_TABLET | Freq: Every evening | ORAL | 3 refills | Status: DC | PRN

## 2017-08-23 MED ORDER — DULOXETINE HCL 60 MG PO CPEP: 60.0000 mg | ORAL_CAPSULE | Freq: Every day | ORAL | 3 refills | Status: DC

## 2017-08-23 NOTE — Progress Notes (Signed)
Subjective:  Patient ID: Paul Jefferson, male    DOB: [birth year]  Age: 62 y.o. MRN: [id number]  CC: Hypothyroidism   HPI Paul Jefferson is a 62 year old male with a history of hypothyroidism, hyperlipidemia, neuropathy, gout, anxiety previously followed by Dr Hyman Hopes and has been on clonazepam for a prolonged period of time but we had discussed dangers of prolonged benzodiazepine use for management of anxiety and he was commenced on Klonopin taper however he informs me today he continues to be under stress from pains in his legs, insomnia,is has been in the early stages of closure, breaking up with his fiance, being unable to work due to his medical conditions.  He also has insomnia which she states Klonopin helps him with. He takes Lyrica for his leg pains and leg twitching however he has run out of this and is currently in the process of applying through the patient assistance program for refills.  At his last visit he was diagnosed with bilateral peripheral arterial disease through in office ABI PVR. Denies suicidal ideation or intents and declines speaking with the LCSW today.  Past Medical History:  Diagnosis Date  . Depression   . Gout   . Neuromuscular disorder (HCC)   . Thyroid disease     Past Surgical History:  Procedure Laterality Date  . CHOLECYSTECTOMY      Allergies  Allergen Reactions  . Mobic [Meloxicam] Nausea Only     Outpatient Medications Prior to Visit  Medication Sig Dispense Refill  . albuterol (VENTOLIN HFA) 108 (90 Base) MCG/ACT inhaler INHALE 2 PUFFS INTO THE LUNGS EVERY 6 HOURS AS NEEDED FOR WHEEZING OR SHORTNESS OF BREATH. 54 g 3  . aspirin EC 81 MG tablet Take 1 tablet (81 mg total) by mouth daily. 30 tablet 0  . colchicine 0.6 MG tablet Take 1 tablet (0.6 mg total) by mouth daily. 90 tablet 0  . cyclobenzaprine (FLEXERIL) 10 MG tablet TAKE 1 TABLET BY MOUTH 3 TIMES DAILY AS NEEDED FOR MUSCLE SPASMS. 30 tablet 3  . levothyroxine (SYNTHROID,  LEVOTHROID) 175 MCG tablet Take 1 tablet (175 mcg total) by mouth daily before breakfast. 90 tablet 3  . pregabalin (LYRICA) 75 MG capsule Take 1 capsule (75 mg total) by mouth 2 (two) times daily. 180 capsule 3  . rosuvastatin (CRESTOR) 20 MG tablet TAKE 1 TABLET BY MOUTH DAILY. 90 tablet 0  . sildenafil (VIAGRA) 50 MG tablet Take 1 tablet (50 mg total) by mouth daily as needed for erectile dysfunction. 30 tablet 3  . clonazePAM (KLONOPIN) 0.5 MG tablet Take 1 tablet (0.5 mg total) by mouth at bedtime. 14 tablet 0  . allopurinol (ZYLOPRIM) 100 MG tablet Take 1 tablet (100 mg total) by mouth daily. (Patient not taking: Reported on 07/18/2017) 90 tablet 3   No facility-administered medications prior to visit.     ROS Review of Systems  Constitutional: Negative for activity change and appetite change.  HENT: Negative for sinus pressure and sore throat.   Eyes: Negative for visual disturbance.  Respiratory: Negative for cough, chest tightness and shortness of breath.   Cardiovascular: Negative for chest pain and leg swelling.  Gastrointestinal: Negative for abdominal distention, abdominal pain, constipation and diarrhea.  Endocrine: Negative.   Genitourinary: Negative for dysuria.  Musculoskeletal:       Leg pains  Skin: Negative for rash.  Allergic/Immunologic: Negative.   Neurological: Negative for weakness, light-headedness and numbness.  Psychiatric/Behavioral: Positive for dysphoric mood and sleep disturbance. Negative for suicidal  ideas.    Objective:  BP 129/83   Pulse 90   Temp 98.1 F (36.7 C) (Oral)   Ht 6\' 3"  (1.905 m)   Wt 264 lb (119.7 kg)   SpO2 96%   BMI 33.00 kg/m   BP/Weight 08/23/2017 08/08/2017 07/18/2017  Systolic BP 129 150 145  Diastolic BP 83 76 86  Wt. (Lbs) 264 264.8 270  BMI 33 33.1 33.75      Physical Exam  Constitutional: He is oriented to person, place, and time. He appears well-developed and well-nourished.  Cardiovascular: Normal rate and  normal heart sounds.  No murmur heard. Absent dorsalis pedis bilaterally  Pulmonary/Chest: Effort normal and breath sounds normal. He has no wheezes. He has no rales. He exhibits no tenderness.  Abdominal: Soft. Bowel sounds are normal. He exhibits no distension and no mass. There is no tenderness.  Musculoskeletal: Normal range of motion.  Neurological: He is alert and oriented to person, place, and time.  Psychiatric:  Dysphoric mood     Assessment & Plan:   1. Generalized anxiety disorder Uncontrolled due to multiple stresses Currently on clonazepam Taper; I am adding Cymbalta to his regimen today. Would love to place on hydroxyzine however this is on back order to the pharmacy. - clonazePAM (KLONOPIN) 0.5 MG tablet; Take 1 tablet (0.5 mg total) by mouth at bedtime as needed for anxiety.  Dispense: 20 tablet; Refill: 0  2. Other insomnia Commenced on Ambien as he is being tapered off Klonopin - zolpidem (AMBIEN) 5 MG tablet; Take 1 tablet (5 mg total) by mouth at bedtime as needed for sleep.  Dispense: 30 tablet; Refill: 3  3. Peripheral arterial disease (HCC) Newly diagnosed Has upcoming appointment with vascular in 10/2017  4. 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  Dispense: 30 capsule; Refill: 3   Meds ordered this encounter  Medications  . clonazePAM (KLONOPIN) 0.5 MG tablet    Sig: Take 1 tablet (0.5 mg total) by mouth at bedtime as needed for anxiety.    Dispense:  20 tablet    Refill:  0    Discontinue previous dose  . DULoxetine (CYMBALTA) 60 MG capsule    Sig: Take 1 capsule (60 mg total) by mouth daily.    Dispense:  30 capsule    Refill:  3  . zolpidem (AMBIEN) 5 MG tablet    Sig: Take 1 tablet (5 mg total) by mouth at bedtime as needed for sleep.    Dispense:  30 tablet     Refill:  3    Follow-up: Return in about 6 weeks (around 10/04/2017) for Follow-up of chronic medical conditions.   Hoy RegisterEnobong Suliman Termini MD

## 2017-08-26 ENCOUNTER — Encounter: Payer: Self-pay | Admitting: Family Medicine

## 2017-08-27 ENCOUNTER — Other Ambulatory Visit: Payer: Self-pay

## 2017-08-27 DIAGNOSIS — G894 Chronic pain syndrome: Secondary | ICD-10-CM

## 2017-08-27 MED ORDER — PREGABALIN 75 MG PO CAPS: 75.0000 mg | ORAL_CAPSULE | Freq: Two times a day (BID) | ORAL | 3 refills | Status: DC

## 2017-08-27 MED ORDER — TETANUS-DIPHTH-ACELL PERTUSSIS 5-2.5-18.5 LF-MCG/0.5 IM SUSP: 0.5000 mL | Freq: Once | INTRAMUSCULAR | 0 refills | Status: AC

## 2017-08-28 MED FILL — $BOOSTRIX VACCINE SYRINGE: 5-2.5-18.5 | 1 days supply | Qty: 1 | Fill #0

## 2017-09-04 ENCOUNTER — Telehealth: Payer: Self-pay

## 2017-09-04 MED FILL — CYCLOBENZAPRINE 10 MG TAB: 10 | 10 days supply | Qty: 30 | Fill #3

## 2017-09-04 NOTE — Telephone Encounter (Signed)
As per Ranee GosselinMaria Ramirez Jefferson, [employer] [place], they are still working with the patient regarding his forclosure.

## 2017-09-23 ENCOUNTER — Telehealth: Payer: Self-pay

## 2017-09-23 DIAGNOSIS — E785 Hyperlipidemia, unspecified: Secondary | ICD-10-CM

## 2017-09-23 DIAGNOSIS — E039 Hypothyroidism, unspecified: Secondary | ICD-10-CM

## 2017-09-23 MED ORDER — LEVOTHYROXINE SODIUM 175 MCG PO TABS: 175.0000 ug | ORAL_TABLET | Freq: Every day | ORAL | 0 refills | Status: DC

## 2017-09-23 MED ORDER — ROSUVASTATIN CALCIUM 20 MG PO TABS: 20.0000 mg | ORAL_TABLET | Freq: Every day | ORAL | 0 refills | Status: DC

## 2017-09-23 MED FILL — SYNTHROID 175 MCG TABLET: 175 | 30 days supply | Qty: 30 | Fill #0

## 2017-09-23 NOTE — Addendum Note (Signed)
Addended byHoy Register: Mariyam Remington on: 09/23/2017 02:28 PM   Modules accepted: Orders

## 2017-09-23 NOTE — Telephone Encounter (Signed)
Patient was called and informed.

## 2017-09-23 NOTE — Telephone Encounter (Signed)
Does patient needs to get thyroid level checked before refilling. Last OV:08/23/17, Last fill:07/10/17.

## 2017-09-23 NOTE — Telephone Encounter (Signed)
I will repeat thyroid labs at his upcoming visit

## 2017-09-23 NOTE — Congregational Nurse Program (Signed)
Congregational Nurse Program Note  Date of Encounter: 09/23/2017  Past Medical History: Past Medical History:  Diagnosis Date  . Depression   . Gout   . Neuromuscular disorder (HCC)   . Thyroid disease     Encounter Details: CNP Questionnaire - 09/18/17 1100      Questionnaire   Patient Status  Not Applicable    Race  White or Caucasian    Location Patient Served [facility]  [employer] [facility] [place]    Insurance  Not Applicable    Uninsured  Uninsured (NEW 1x/quarter)    Food  No food insecurities    Housing/Utilities  Yes, have permanent housing    Transportation  No transportation needs    Interpersonal Safety  Yes, feel physically and emotionally safe where you currently live    Medication  No medication insecurities    Medical Provider  No    Referrals  [employer], [employer]    ED Visit Averted  Not Applicable    Life-Saving Intervention Made  Not Applicable     Seen at [employer] food [facility] [address]; [address] [facility] Jenene SlickerEmma Jenna Ardoin RN, [place] [facility] [facility] [phone]

## 2017-09-30 MED FILL — CYCLOBENZAPRINE 10 MG TAB: 10 | 10 days supply | Qty: 30 | Fill #1

## 2017-09-30 MED FILL — ROSUVASTATIN CALCIUM 20 MG: 20 | 30 days supply | Qty: 30 | Fill #0

## 2017-10-04 ENCOUNTER — Ambulatory Visit: Payer: Self-pay | Admitting: Licensed Clinical Social Worker

## 2017-10-04 ENCOUNTER — Encounter: Payer: Self-pay | Admitting: Family Medicine

## 2017-10-04 ENCOUNTER — Ambulatory Visit: Payer: Self-pay | Attending: Family Medicine | Admitting: Family Medicine

## 2017-10-04 VITALS — BP 109/69 | HR 89 | Temp 97.7°F | Wt 256.6 lb

## 2017-10-04 DIAGNOSIS — I10 Essential (primary) hypertension: Secondary | ICD-10-CM | POA: Insufficient documentation

## 2017-10-04 DIAGNOSIS — Z7982 Long term (current) use of aspirin: Secondary | ICD-10-CM | POA: Insufficient documentation

## 2017-10-04 DIAGNOSIS — Z79899 Other long term (current) drug therapy: Secondary | ICD-10-CM | POA: Insufficient documentation

## 2017-10-04 DIAGNOSIS — F331 Major depressive disorder, recurrent, moderate: Secondary | ICD-10-CM

## 2017-10-04 DIAGNOSIS — I739 Peripheral vascular disease, unspecified: Secondary | ICD-10-CM | POA: Insufficient documentation

## 2017-10-04 DIAGNOSIS — F419 Anxiety disorder, unspecified: Secondary | ICD-10-CM

## 2017-10-04 DIAGNOSIS — Z9049 Acquired absence of other specified parts of digestive tract: Secondary | ICD-10-CM | POA: Insufficient documentation

## 2017-10-04 DIAGNOSIS — F411 Generalized anxiety disorder: Secondary | ICD-10-CM | POA: Insufficient documentation

## 2017-10-04 DIAGNOSIS — M10072 Idiopathic gout, left ankle and foot: Secondary | ICD-10-CM | POA: Insufficient documentation

## 2017-10-04 DIAGNOSIS — F320 Major depressive disorder, single episode, mild: Secondary | ICD-10-CM

## 2017-10-04 DIAGNOSIS — Z888 Allergy status to other drugs, medicaments and biological substances status: Secondary | ICD-10-CM | POA: Insufficient documentation

## 2017-10-04 DIAGNOSIS — F329 Major depressive disorder, single episode, unspecified: Secondary | ICD-10-CM | POA: Insufficient documentation

## 2017-10-04 MED ORDER — HYDROXYZINE PAMOATE 25 MG PO CAPS: 25.0000 mg | ORAL_CAPSULE | Freq: Three times a day (TID) | ORAL | 3 refills | Status: DC | PRN

## 2017-10-04 MED ORDER — DULOXETINE HCL 60 MG PO CPEP: 60.0000 mg | ORAL_CAPSULE | Freq: Every day | ORAL | 3 refills | Status: DC

## 2017-10-04 MED ORDER — ACETAMINOPHEN-CODEINE #3 300-30 MG PO TABS: 1.0000 | ORAL_TABLET | Freq: Two times a day (BID) | ORAL | 0 refills | Status: DC | PRN

## 2017-10-04 MED ORDER — COLCHICINE 0.6 MG PO TABS: 0.6000 mg | ORAL_TABLET | Freq: Every day | ORAL | 0 refills | Status: DC

## 2017-10-04 MED ORDER — COLCHICINE 0.6 MG PO TABS
0.6000 mg | ORAL_TABLET | Freq: Every day | ORAL | 0 refills | Status: AC
Start: 2017-10-04 — End: ?

## 2017-10-04 MED ORDER — PREDNISONE 20 MG PO TABS: 20.0000 mg | ORAL_TABLET | Freq: Two times a day (BID) | ORAL | 0 refills | Status: DC

## 2017-10-04 MED FILL — ACETAMINOPHEN/COD #3 TABLET: 300-30 | 15 days supply | Qty: 30 | Fill #0

## 2017-10-04 MED FILL — DULoxetine HCL 60 MG CPEP: 60 | 30 days supply | Qty: 30 | Fill #0

## 2017-10-04 MED FILL — HYDROXYZINE PAM 25 MG CAP: 25 | 20 days supply | Qty: 60 | Fill #0

## 2017-10-04 NOTE — Progress Notes (Signed)
Subjective:  Patient ID: Paul Jefferson, male    DOB: 1955-08-19  Age: 62 y.o. MRN: [id number]  CC: Hypertension   HPI Paul Jefferson  is a 62 year old male with a history of hypothyroidism, hyperlipidemia, neuropathy, gout, anxiety , here for a follow up visit. He was prescribed Cymbalta for anxiety and depression and to help with pain in his legs but he was never able to obtain it due to the cost. He was tapered off Clonazepam at his last visit and informs me he has a few pills for when his anxiety is so high. He is currently stressed from not being able to work as a result of his medical conditions. He denies suicidal ideations or intents but states he gets agitated late in the day. He has an upcoming appointment with vascular surgery next week due to new diagnosis of PAD via  ABI performed in the clinic; continues to have pain in his legs   He had a Gout flare a few days ago in his left foot which did not resolve with colcrys and he is requesting some Tylenol #3. He tried Allopurinol in the past for prophylaxis but this did not help.  Past Medical History:  Diagnosis Date  . Depression   . Gout   . Neuromuscular disorder (HCC)   . Thyroid disease     Past Surgical History:  Procedure Laterality Date  . CHOLECYSTECTOMY      Allergies  Allergen Reactions  . Mobic [Meloxicam] Nausea Only     Outpatient Medications Prior to Visit  Medication Sig Dispense Refill  . albuterol (VENTOLIN HFA) 108 (90 Base) MCG/ACT inhaler INHALE 2 PUFFS INTO THE LUNGS EVERY 6 HOURS AS NEEDED FOR WHEEZING OR SHORTNESS OF BREATH. 54 g 3  . aspirin EC 81 MG tablet Take 1 tablet (81 mg total) by mouth daily. 30 tablet 0  . clonazePAM (KLONOPIN) 0.5 MG tablet Take 1 tablet (0.5 mg total) by mouth at bedtime as needed for anxiety. 20 tablet 0  . cyclobenzaprine (FLEXERIL) 10 MG tablet TAKE 1 TABLET BY MOUTH 3 TIMES DAILY AS NEEDED FOR MUSCLE SPASMS. 30 tablet 3  . levothyroxine (SYNTHROID,  LEVOTHROID) 175 MCG tablet Take 1 tablet (175 mcg total) by mouth daily before breakfast. 30 tablet 0  . pregabalin (LYRICA) 75 MG capsule Take 1 capsule (75 mg total) by mouth 2 (two) times daily. 180 capsule 3  . rosuvastatin (CRESTOR) 20 MG tablet Take 1 tablet (20 mg total) by mouth daily. 90 tablet 0  . sildenafil (VIAGRA) 50 MG tablet Take 1 tablet (50 mg total) by mouth daily as needed for erectile dysfunction. 30 tablet 3  . zolpidem (AMBIEN) 5 MG tablet Take 1 tablet (5 mg total) by mouth at bedtime as needed for sleep. 30 tablet 3  . colchicine 0.6 MG tablet Take 1 tablet (0.6 mg total) by mouth daily. 90 tablet 0  . allopurinol (ZYLOPRIM) 100 MG tablet Take 1 tablet (100 mg total) by mouth daily. (Patient not taking: Reported on 07/18/2017) 90 tablet 3  . DULoxetine (CYMBALTA) 60 MG capsule Take 1 capsule (60 mg total) by mouth daily. (Patient not taking: Reported on 10/04/2017) 30 capsule 3   No facility-administered medications prior to visit.     ROS Review of Systems  Constitutional: Negative for activity change and appetite change.  HENT: Negative for sinus pressure and sore throat.   Eyes: Negative for visual disturbance.  Respiratory: Negative for cough, chest tightness and shortness of  breath.   Cardiovascular: Negative for chest pain and leg swelling.  Gastrointestinal: Negative for abdominal distention, abdominal pain, constipation and diarrhea.  Endocrine: Negative.   Genitourinary: Negative for dysuria.  Musculoskeletal:       See hpi  Skin: Negative for rash.  Allergic/Immunologic: Negative.   Neurological: Negative for weakness, light-headedness and numbness.  Psychiatric/Behavioral: Negative for dysphoric mood and suicidal ideas.       Positive for anxiety    Objective:  BP 109/69   Pulse 89   Temp 97.7 F (36.5 C) (Oral)   Wt 256 lb 9.6 oz (116.4 kg)   SpO2 96%   BMI 32.07 kg/m   BP/Weight 10/04/2017 08/23/2017 08/08/2017  Systolic BP 109 129 150    Diastolic BP 69 83 76  Wt. (Lbs) 256.6 264 264.8  BMI 32.07 33 33.1      Physical Exam  Constitutional: He is oriented to person, place, and time. He appears well-developed and well-nourished.  Cardiovascular: Normal rate and normal heart sounds.  No murmur heard. Absent dorsalis pedis b/l  Pulmonary/Chest: Effort normal and breath sounds normal. He has no wheezes. He has no rales. He exhibits no tenderness.  Abdominal: Soft. Bowel sounds are normal. He exhibits no distension and no mass. There is no tenderness.  Musculoskeletal: Normal range of motion.  Neurological: He is alert and oriented to person, place, and time.  Skin: Skin is warm and dry.  Psychiatric:  Dysphoric mood     Assessment & Plan:   1. Current mild episode of major depressive disorder without prior episode (HCC) Unable to obtain Cymbalta due to cost Rx transferred to pharmacy in house LCSW called in for counseling. - DULoxetine (CYMBALTA) 60 MG capsule; Take 1 capsule (60 mg total) by mouth daily.  Dispense: 30 capsule; Refill: 3  2. Acute idiopathic gout of left foot Allopurinol did not help with Gout prophylaxis so he takes Colchicine Will give Tylenol #3 and Prednisone for subacute flare - predniSONE (DELTASONE) 20 MG tablet; Take 1 tablet (20 mg total) by mouth 2 (two) times daily with a meal.  Dispense: 10 tablet; Refill: 0 - acetaminophen-codeine (TYLENOL #3) 300-30 MG tablet; Take 1 tablet by mouth every 12 (twelve) hours as needed for moderate pain.  Dispense: 30 tablet; Refill: 0 - colchicine 0.6 MG tablet; Take 1 tablet (0.6 mg total) by mouth daily.  Dispense: 90 tablet; Refill: 0  3. Generalized anxiety disorder Uncontrolled Weaned off Klonopin successfully; he has few pills at home which he has not needed lately but will use sparingly - hydrOXYzine (VISTARIL) 25 MG capsule; Take 1 capsule (25 mg total) by mouth 3 (three) times daily as needed.  Dispense: 60 capsule; Refill: 3  4. Peripheral  arterial disease (HCC) Has upcoming appointment with vascular next week.   Meds ordered this encounter  Medications  . DULoxetine (CYMBALTA) 60 MG capsule    Sig: Take 1 capsule (60 mg total) by mouth daily.    Dispense:  30 capsule    Refill:  3  . predniSONE (DELTASONE) 20 MG tablet    Sig: Take 1 tablet (20 mg total) by mouth 2 (two) times daily with a meal.    Dispense:  10 tablet    Refill:  0  . acetaminophen-codeine (TYLENOL #3) 300-30 MG tablet    Sig: Take 1 tablet by mouth every 12 (twelve) hours as needed for moderate pain.    Dispense:  30 tablet    Refill:  0  . colchicine  0.6 MG tablet    Sig: Take 1 tablet (0.6 mg total) by mouth daily.    Dispense:  90 tablet    Refill:  0  . hydrOXYzine (VISTARIL) 25 MG capsule    Sig: Take 1 capsule (25 mg total) by mouth 3 (three) times daily as needed.    Dispense:  60 capsule    Refill:  3    Follow-up: Return in about 3 months (around 01/04/2018) for Up of chronic medical conditions.   Hoy Register MD

## 2017-10-04 NOTE — BH Specialist Note (Signed)
Integrated Behavioral Health Initial Visit  MRN: [id number] Name: Paul Jefferson  Number of Integrated Behavioral Health Clinician visits:: 2/6 Session Start time: 2:15 PM  Session End time: 2:45 PM Total time: 30 minutes  Type of Service: Integrated Behavioral Health- Individual/Family Interpretor:No. Interpretor Name and Language: N/A    SUBJECTIVE: Paul Jefferson is a 62 y.o. male accompanied by self Patient was referred by self for depression and anxiety. Patient reports the following symptoms/concerns: feelings of sadness and worry, difficulty sleeping, low energy, feeling bad about self due to financial strain, decreased concentration, and chronic pain Duration of problem: Ongoing; Severity of problem: moderate  OBJECTIVE: Mood: Depressed and Affect: Depressed Risk of harm to self or others: No plan to harm self or others  LIFE CONTEXT: Family and Social: Pt resides alone School/Work: Pt has not worked in 5 years. He is currently living off of a settlement from [employer].  Pt is planning to apply for Financial Counseling and is currently working with Legal Aid to assist with benefits Self-Care: Pt is open to medication management through PCP; however, was unable to fill due to financial strain. Pt agreed to use [facility] to assist in management of mental health  Life Changes: Pt has ongoing medical conditions and is experiencing financial strain.   GOALS ADDRESSED: Patient will: 1. Reduce symptoms of: stress 2. Increase knowledge and/or ability of: stress reduction  3. Demonstrate ability to: Increase healthy adjustment to current life circumstances and Increase adequate support systems for patient/family  INTERVENTIONS: Interventions utilized: Solution-Focused Strategies, Supportive Counseling and Link to [facility]  Standardized Assessments completed: GAD-7 and PHQ 2&9  ASSESSMENT: Patient currently experiencing depression and anxiety triggered  by ongoing medical conditions, chronic pain, and financial strain. He reports feelings of sadness and worry, difficulty sleeping, low energy, feeling bad about self, and decreased concentration. No report of SI/HI/AVH    Patient may benefit from psychotherapy. He has agreed to participate in medication management through PCP by utilizing [facility] to assist with financial strain. LCSWA educated pt on symptoms of stress and discussed stress management strategies. LCSWA provided community resources to assist with obtaining employment, per pt's request.   Pt is currently working with Paul Jefferson with Legal Aid and plans to schedule an appointment with Financial Counseling to assist with financial strain.   PLAN: 1. Follow up with behavioral health clinician on : Pt was encouraged to contact LCSWA if symptoms worsen or fail to improve to schedule behavioral appointments at [facility]. 2. Behavioral recommendations: LCSWA recommends that pt apply healthy coping skills discussed, comply with medication management, and follow up with legal aid's recommendations. Pt is encouraged to schedule follow up appointment with LCSWA 3. Referral(s): Integrated Art gallery manager (In Clinic) and [employer]:  Finances 4. "From scale of 1-10, how likely are you to follow plan?":   Paul Larsson, LCSW 10/07/17 4:08 PM

## 2017-10-04 NOTE — Progress Notes (Signed)
Patient would like Tylenol #3

## 2017-10-08 ENCOUNTER — Ambulatory Visit (INDEPENDENT_AMBULATORY_CARE_PROVIDER_SITE_OTHER): Payer: Self-pay | Admitting: Vascular Surgery

## 2017-10-08 ENCOUNTER — Other Ambulatory Visit: Payer: Self-pay

## 2017-10-08 ENCOUNTER — Encounter: Payer: Self-pay | Admitting: Vascular Surgery

## 2017-10-08 VITALS — BP 121/74 | HR 88 | Temp 98.3°F | Resp 18 | Ht 75.0 in | Wt 251.0 lb

## 2017-10-08 DIAGNOSIS — M25569 Pain in unspecified knee: Secondary | ICD-10-CM

## 2017-10-08 NOTE — Progress Notes (Signed)
[facility] [place]  Patient name: Paul Jefferson MRN: [id number] DOB: Oct 31, 1955 Sex: male  REASON FOR CONSULT: Discussion of lower extremity pain and screening study suggesting peripheral arterial disease  HPI: Paul Jefferson is a 62 y.o. male, who is here today for discussion of lower extremity pain.  He has multiple components of this.  He has been diagnosed with neuropathy and gout.  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  He reports that both lower extremities caused him a great deal of discomfort.  This is begins above the level of the knee and extends all the way down into his feet.  He reports that he is awakened at night with this and that it can be in both legs through the entirety of both legs.  He needs to stretch and do other movements to help relieve the discomfort.  He does not have any true arterial claudication symptoms or arterial rest pain symptoms.  He had tried Neurontin and Lyrica with no improvement.  Past Medical History:  Diagnosis Date  . Depression   . Gout   . Neuromuscular disorder (HCC)   . Thyroid disease     History reviewed. No pertinent family history.  SOCIAL HISTORY: Social History   Socioeconomic History  . Marital status: Single    Spouse name: Not on file  . Number of children: Not on file  . Years of education: Not on file  . Highest education level: Not on file  Occupational History  . Not on file  Social Needs  . Financial resource strain: Not on file  . Food insecurity:    Worry: Not on file    Inability: Not on file  . Transportation needs:    Medical: Not on file    Non-medical: Not on file  Tobacco Use  . Smoking status: Current Every Day Smoker    Packs/day: 0.50    Types: Cigarettes  . Smokeless tobacco: Never Used  Substance and Sexual Activity  . Alcohol use: No  . Drug use: No  . Sexual activity: Never  Lifestyle  .  Physical activity:    Days per week: Not on file    Minutes per session: Not on file  . Stress: Not on file  Relationships  . Social connections:    Talks on phone: Not on file    Gets together: Not on file    Attends religious service: Not on file    Active member of club or organization: Not on file    Attends meetings of clubs or organizations: Not on file    Relationship status: Not on file  . Intimate partner violence:    Fear of current or ex partner: Not on file    Emotionally abused: Not on file    Physically abused: Not on file    Forced sexual activity: Not on file  Other Topics Concern  . Not on file  Social History Narrative  . Not on file    Allergies  Allergen Reactions  . Mobic [Meloxicam] Nausea Only  . Penicillins Nausea And Vomiting    Current Outpatient Medications  Medication Sig Dispense Refill  . albuterol (VENTOLIN HFA) 108 (90 Base) MCG/ACT inhaler INHALE 2 PUFFS INTO THE LUNGS EVERY 6 HOURS AS NEEDED FOR WHEEZING OR SHORTNESS OF BREATH. 54 g 3  . aspirin EC 81 MG tablet Take 1 tablet (81 mg total) by mouth daily. 30 tablet  0  . colchicine 0.6 MG tablet Take 1 tablet (0.6 mg total) by mouth daily. 90 tablet 0  . cyclobenzaprine (FLEXERIL) 10 MG tablet TAKE 1 TABLET BY MOUTH 3 TIMES DAILY AS NEEDED FOR MUSCLE SPASMS. 30 tablet 3  . hydrOXYzine (VISTARIL) 25 MG capsule Take 1 capsule (25 mg total) by mouth 3 (three) times daily as needed. 60 capsule 3  . levothyroxine (SYNTHROID, LEVOTHROID) 175 MCG tablet Take 1 tablet (175 mcg total) by mouth daily before breakfast. 30 tablet 0  . rosuvastatin (CRESTOR) 20 MG tablet Take 1 tablet (20 mg total) by mouth daily. 90 tablet 0  . zolpidem (AMBIEN) 5 MG tablet Take 1 tablet (5 mg total) by mouth at bedtime as needed for sleep. 30 tablet 3  . acetaminophen-codeine (TYLENOL #3) 300-30 MG tablet Take 1 tablet by mouth every 12 (twelve) hours as needed for moderate pain. (Patient not taking: Reported on 10/08/2017)  30 tablet 0  . allopurinol (ZYLOPRIM) 100 MG tablet Take 1 tablet (100 mg total) by mouth daily. (Patient not taking: Reported on 10/08/2017) 90 tablet 3  . clonazePAM (KLONOPIN) 0.5 MG tablet Take 1 tablet (0.5 mg total) by mouth at bedtime as needed for anxiety. (Patient not taking: Reported on 10/08/2017) 20 tablet 0  . DULoxetine (CYMBALTA) 60 MG capsule Take 1 capsule (60 mg total) by mouth daily. (Patient not taking: Reported on 10/08/2017) 30 capsule 3  . predniSONE (DELTASONE) 20 MG tablet Take 1 tablet (20 mg total) by mouth 2 (two) times daily with a meal. (Patient not taking: Reported on 10/08/2017) 10 tablet 0  . pregabalin (LYRICA) 75 MG capsule Take 1 capsule (75 mg total) by mouth 2 (two) times daily. (Patient not taking: Reported on 10/08/2017) 180 capsule 3  . sildenafil (VIAGRA) 50 MG tablet Take 1 tablet (50 mg total) by mouth daily as needed for erectile dysfunction. (Patient not taking: Reported on 10/08/2017) 30 tablet 3   No current facility-administered medications for this visit.     REVIEW OF SYSTEMS:   denotes positive finding,  denotes negative finding Cardiac  Comments:  Chest pain or chest pressure:    Shortness of breath upon exertion: x   Short of breath when lying flat:    Irregular heart rhythm:        Vascular    Pain in calf, thigh, or hip brought on by ambulation: x   Pain in feet at night that wakes you up from your sleep:  x   Blood clot in your veins:    Leg swelling:  x       Pulmonary    Oxygen at home:    Productive cough:     Wheezing:         Neurologic    Sudden weakness in arms or legs:     Sudden numbness in arms or legs:     Sudden onset of difficulty speaking or slurred speech:    Temporary loss of vision in one eye:     Problems with dizziness:         Gastrointestinal    Blood in stool:     Vomited blood:         Genitourinary    Burning when urinating:     Blood in urine:        Psychiatric    Major depression:  x         Hematologic    Bleeding problems:    Problems with blood  clotting too easily:        Skin    Rashes or ulcers:        Constitutional    Fever or chills:      PHYSICAL EXAM: Vitals:   10/08/17 1424  BP: 121/74  Pulse: 88  Resp: 18  Temp: 98.3 F (36.8 C)  TempSrc: Oral  SpO2: 96%  Weight: 251 lb (113.9 kg)  Height:  (1.905 m)    GENERAL: The patient is a well-nourished male, in no acute distress. The vital signs are documented above. CARDIOVASCULAR: 2+ radial 2+ femoral and 2+ popliteal pulses bilaterally.  He has a 1+ right dorsalis pedis and posterior tibial and 2+ left posterior tibial pulse.  By hand-held Doppler he has multiphasic signal in his posterior tibial bilaterally PULMONARY: There is good air exchange  ABDOMEN: Soft and non-tender  MUSCULOSKELETAL: There are no major deformities or cyanosis. NEUROLOGIC: No focal weakness or paresthesias are detected. SKIN: There are no ulcers or rashes noted. PSYCHIATRIC: The patient has a normal affect.  DATA:  He apparently had a screening study which is not available to me suggesting some moderate arterial insufficiency  MEDICAL ISSUES: Had a long discussion with the patient.  He has completely normal popliteal pulses and a completely normal left posterior tibial pulse and some diminished right posterior tibial pulse.  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  I explained that this is not consistent with his symptoms of total leg discomfort and restlessness.  I do not feel that his mild arterial insufficiency has any relationship to his complaints.  I explained that this is not limb threatening.  He is frustrated at not having relief from this.  He is requesting prescription drugs for anxiety and other issues.  I explained that I am not comfortable providing this since he does not have any evidence of vascular disease of any significance.  He will follow-up with his medical doctor for further direction and see [place]  on an as-needed basis   Larina Earthly, MD [facility] Tel [phone] Pager [phone]

## 2017-10-10 MED FILL — CYCLOBENZAPRINE 10 MG TAB: 10 | 10 days supply | Qty: 30 | Fill #2

## 2017-10-18 MED FILL — CYCLOBENZAPRINE 10 MG TAB: 10 | 10 days supply | Qty: 30 | Fill #3

## 2017-10-30 MED FILL — DULoxetine HCL 60 MG CPEP: 60 | 30 days supply | Qty: 30 | Fill #1

## 2017-10-30 MED FILL — HYDROXYZINE PAM 25 MG CAP: 25 | 20 days supply | Qty: 60 | Fill #1

## 2017-11-06 ENCOUNTER — Ambulatory Visit (INDEPENDENT_AMBULATORY_CARE_PROVIDER_SITE_OTHER): Payer: Self-pay | Admitting: Physician Assistant

## 2017-11-06 ENCOUNTER — Encounter (INDEPENDENT_AMBULATORY_CARE_PROVIDER_SITE_OTHER): Payer: Self-pay | Admitting: Physician Assistant

## 2017-11-06 ENCOUNTER — Other Ambulatory Visit: Payer: Self-pay

## 2017-11-06 ENCOUNTER — Telehealth: Payer: Self-pay

## 2017-11-06 VITALS — BP 121/71 | HR 95 | Temp 98.2°F | Ht 75.0 in | Wt 239.8 lb

## 2017-11-06 DIAGNOSIS — M1A9XX0 Chronic gout, unspecified, without tophus (tophi): Secondary | ICD-10-CM | POA: Insufficient documentation

## 2017-11-06 DIAGNOSIS — G5793 Unspecified mononeuropathy of bilateral lower limbs: Secondary | ICD-10-CM | POA: Insufficient documentation

## 2017-11-06 DIAGNOSIS — G4700 Insomnia, unspecified: Secondary | ICD-10-CM

## 2017-11-06 DIAGNOSIS — M79605 Pain in left leg: Secondary | ICD-10-CM

## 2017-11-06 DIAGNOSIS — F418 Other specified anxiety disorders: Secondary | ICD-10-CM | POA: Insufficient documentation

## 2017-11-06 DIAGNOSIS — M79604 Pain in right leg: Secondary | ICD-10-CM | POA: Insufficient documentation

## 2017-11-06 MED ORDER — CLONAZEPAM 0.5 MG PO TABS: 0.5000 mg | ORAL_TABLET | Freq: Every evening | ORAL | 0 refills | Status: DC | PRN

## 2017-11-06 MED ORDER — ESCITALOPRAM OXALATE 20 MG PO TABS: 20.0000 mg | ORAL_TABLET | Freq: Every day | ORAL | 5 refills | Status: DC

## 2017-11-06 MED ORDER — ARIPIPRAZOLE 2 MG PO TABS: 2.0000 mg | ORAL_TABLET | Freq: Every day | ORAL | 5 refills | Status: DC

## 2017-11-06 NOTE — Progress Notes (Signed)
Subjective:  Patient ID: Paul Jefferson, male    DOB: 11/24Lucienne Jefferson/1957  Age: 62 y.o. MRN: [id number]  CC: depression  HPI Paul Jefferson is a 62 y.o. male with a medical history of depression, anxiety, chronic pain syndrome, insomnia, hypothyroidism, HLD, gout, mild PAD, and lower extremity neuropathy presents as a new patient for management of depression, anxiety, and lower extremity pain that is concentrated in his ankles and feet. Has failed on gabapentin, Lyrica, and vascular consult does not believe his leg pain is attributed to his mild arterial insufficiency. Pt known to have gout with uric acid at 8.5 mg/dL on 08/6/578410/10/2012. Has taken Allopurinol 100 mg to little effect and Colcrys to great effect in relieving his pain.     States his depression is worsening since the change in his anti-depressive medications. Says Cymbalta has not done anything to relieve his depression/anxiety. Does not remember his previous prescription of Paxil being efficacious either. Lost his mother recently and feels more depressed. Chronic bilateral lower extremity pain is also said to contribute to his depression.      Outpatient Medications Prior to Visit  Medication Sig Dispense Refill  . acetaminophen-codeine (TYLENOL #3) 300-30 MG tablet Take 1 tablet by mouth every 12 (twelve) hours as needed for moderate pain. (Patient not taking: Reported on 10/08/2017) 30 tablet 0  . albuterol (VENTOLIN HFA) 108 (90 Base) MCG/ACT inhaler INHALE 2 PUFFS INTO THE LUNGS EVERY 6 HOURS AS NEEDED FOR WHEEZING OR SHORTNESS OF BREATH. 54 g 3  . allopurinol (ZYLOPRIM) 100 MG tablet Take 1 tablet (100 mg total) by mouth daily. (Patient not taking: Reported on 10/08/2017) 90 tablet 3  . aspirin EC 81 MG tablet Take 1 tablet (81 mg total) by mouth daily. 30 tablet 0  . clonazePAM (KLONOPIN) 0.5 MG tablet Take 1 tablet (0.5 mg total) by mouth at bedtime as needed for anxiety. (Patient not taking: Reported on 10/08/2017) 20 tablet 0  .  colchicine 0.6 MG tablet Take 1 tablet (0.6 mg total) by mouth daily. 90 tablet 0  . cyclobenzaprine (FLEXERIL) 10 MG tablet TAKE 1 TABLET BY MOUTH 3 TIMES DAILY AS NEEDED FOR MUSCLE SPASMS. 30 tablet 3  . DULoxetine (CYMBALTA) 60 MG capsule Take 1 capsule (60 mg total) by mouth daily. (Patient not taking: Reported on 10/08/2017) 30 capsule 3  . hydrOXYzine (VISTARIL) 25 MG capsule Take 1 capsule (25 mg total) by mouth 3 (three) times daily as needed. 60 capsule 3  . levothyroxine (SYNTHROID, LEVOTHROID) 175 MCG tablet Take 1 tablet (175 mcg total) by mouth daily before breakfast. 30 tablet 0  . predniSONE (DELTASONE) 20 MG tablet Take 1 tablet (20 mg total) by mouth 2 (two) times daily with a meal. (Patient not taking: Reported on 10/08/2017) 10 tablet 0  . pregabalin (LYRICA) 75 MG capsule Take 1 capsule (75 mg total) by mouth 2 (two) times daily. (Patient not taking: Reported on 10/08/2017) 180 capsule 3  . rosuvastatin (CRESTOR) 20 MG tablet Take 1 tablet (20 mg total) by mouth daily. 90 tablet 0  . sildenafil (VIAGRA) 50 MG tablet Take 1 tablet (50 mg total) by mouth daily as needed for erectile dysfunction. (Patient not taking: Reported on 10/08/2017) 30 tablet 3  . zolpidem (AMBIEN) 5 MG tablet Take 1 tablet (5 mg total) by mouth at bedtime as needed for sleep. 30 tablet 3   No facility-administered medications prior to visit.      ROS Review of Systems  Constitutional: Negative for  chills, fever and malaise/fatigue.  Eyes: Negative for blurred vision.  Respiratory: Negative for shortness of breath.   Cardiovascular: Negative for chest pain and palpitations.  Gastrointestinal: Negative for abdominal pain and nausea.  Genitourinary: Negative for dysuria and hematuria.  Musculoskeletal: Positive for joint pain. Negative for myalgias.  Skin: Negative for rash.  Neurological: Negative for tingling and headaches.  Psychiatric/Behavioral: Positive for depression. The patient is nervous/anxious and  has insomnia.     Objective:  Ht 6\' 3"  (1.905 m)   Wt 239 lb 12.8 oz (108.8 kg)   BMI 29.97 kg/m   Vitals:   11/06/17 1415  BP: 121/71  Pulse: 95  Temp: 98.2 F (36.8 C)  SpO2: 97%       Physical Exam  Constitutional: He is oriented to person, place, and time.  Well developed, overweight, NAD, polite  HENT:  Head: Normocephalic and atraumatic.  Eyes: No scleral icterus.  Neck: Normal range of motion. Neck supple. No thyromegaly present.  Cardiovascular: Normal rate, regular rhythm and normal heart sounds.  Pulmonary/Chest: Effort normal and breath sounds normal.  Musculoskeletal: He exhibits no edema.  Normal gait  Neurological: He is alert and oriented to person, place, and time.  Skin: Skin is warm and dry. No rash noted. No erythema. No pallor.  Psychiatric: His behavior is normal. Thought content normal.  Tearful, depressed, constricted affect  Vitals reviewed.    Assessment & Plan:   1. Depression with anxiety - Begin escitalopram (LEXAPRO) 20 MG tablet; Take 1 tablet (20 mg total) by mouth daily.  Dispense: 30 tablet; Refill: 5 - Refill clonazePAM (KLONOPIN) 0.5 MG tablet; Take 1 tablet (0.5 mg total) by mouth at bedtime as needed for anxiety.  Dispense: 30 tablet; Refill: 0 - Begin ARIPiprazole (ABILIFY) 2 MG tablet; Take 1 tablet (2 mg total) by mouth daily.  Dispense: 30 tablet; Refill: 5  2. Insomnia, unspecified type - Clonazepam 0.5 mg qhs  3. Neuropathy involving both lower extremities - Ambulatory referral to Neurology  4. Chronic gout without tophus, unspecified cause, unspecified site - Uric Acid  5. Pain in both lower extremities - ANA w/Reflex - Rheumatoid factor - Sedimentation Rate - C-reactive protein   Meds ordered this encounter  Medications  . escitalopram (LEXAPRO) 20 MG tablet    Sig: Take 1 tablet (20 mg total) by mouth daily.    Dispense:  30 tablet    Refill:  5    Order Specific Question:   Supervising Provider     Answer:   Hoy Register [[id number]]  . clonazePAM (KLONOPIN) 0.5 MG tablet    Sig: Take 1 tablet (0.5 mg total) by mouth at bedtime as needed for anxiety.    Dispense:  30 tablet    Refill:  0    Discontinue previous dose    Order Specific Question:   Supervising Provider    Answer:   Hoy Register [[id number]]  . ARIPiprazole (ABILIFY) 2 MG tablet    Sig: Take 1 tablet (2 mg total) by mouth daily.    Dispense:  30 tablet    Refill:  5    Order Specific Question:   Supervising Provider    Answer:   Hoy Register [[id number]]    Follow-up: Return in about 1 month (around 12/04/2017) for depression with anxiety.   Loletta Specter PA

## 2017-11-06 NOTE — Patient Instructions (Signed)
Major Depressive Disorder, Adult Major depressive disorder (MDD) is a mental health condition. MDD often makes you feel sad, hopeless, or helpless. MDD can also cause symptoms in your body. MDD can affect your:  Work.  School.  Relationships.  Other normal activities.  MDD can range from mild to very bad. It may occur once (single episode MDD). It can also occur many times (recurrent MDD). The main symptoms of MDD often include:  Feeling sad, depressed, or irritable most of the time.  Loss of interest.  MDD symptoms also include:  Sleeping too much or too little.  Eating too much or too little.  A change in your weight.  Feeling tired (fatigue) or having low energy.  Feeling worthless.  Feeling guilty.  Trouble making decisions.  Trouble thinking clearly.  Thoughts of suicide or harming others.  Feeling weak.  Feeling agitated.  Keeping yourself from being around other people (isolation).  Follow these instructions at home: Activity  Do these things as told by your doctor: ? Go back to your normal activities. ? Exercise regularly. ? Spend time outdoors. Alcohol  Talk with your doctor about how alcohol can affect your antidepressant medicines.  Do not drink alcohol. Or, limit how much alcohol you drink. ? This means no more than 1 drink a day for nonpregnant women and 2 drinks a day for men. One drink equals one of these:  12 oz of beer.  5 oz of wine.  1 oz of hard liquor. General instructions  Take over-the-counter and prescription medicines only as told by your doctor.  Eat a healthy diet.  Get plenty of sleep.  Find activities that you enjoy. Make time to do them.  Think about joining a support group. Your doctor may be able to suggest a group for you.  Keep all follow-up visits as told by your doctor. This is important. Where to find more information:  The [employer] American on Mental Illness: ? www.nami.org  U.S. [employer] of  Mental Health: ? http://www.maynard.net/  National Suicide Prevention Lifeline: ? 607 641 3008. This is free, 24-hour help. Contact a doctor if:  Your symptoms get worse.  You have new symptoms. Get help right away if:  You self-harm.  You see, hear, taste, smell, or feel things that are not present (hallucinate). If you ever feel like you may hurt yourself or others, or have thoughts about taking your own life, get help right away. You can go to your nearest emergency department or call:  Your local emergency services (911 in the U.S.).  A suicide crisis helpline, such as the National Suicide Prevention Lifeline: ? 343 384 3045. This is open 24 hours a day.  This information is not intended to replace advice given to you by your health care provider. Make sure you discuss any questions you have with your health care provider. Document Released: 05/02/2015 Document Revised: 02/05/2016 Document Reviewed: 02/05/2016 Elsevier Interactive Patient Education  2017 [employer].    [employer]  Advocacy/Legal Legal Aid [place]:  940-678-3988  /  920-654-7643  Family Justice Center:  (641)338-9383  Family Service of the [facility] 24-hr Crisis line:  (770)094-5103  [facility] [place]:  346-786-4230  Court Watch (custody):  709-268-4544  [employer] Law Clinic:   563-650-4728    Baby & Breastfeeding Car Seat Inspection @ Various [place] Equities trader.- call [phone]  [facility] Lactation  [phone]  [facility] Lactation [phone]  WIC: [phone] ([place]);  [phone] ([place])  [place] Humphrey League:  2230025102  Childcare [place] Child Development: 336-369-5097 ([place]) / 336-887-8224 ([place])  - Child Care Resources/ Referrals/ Scholarships  - Head Start/ Early Head Start (call or apply online)  [place] DHHS: [place] Pre-K :  1-800-859-0829 / 336-274-5437   Employment / Job Search Women's [facility] of [place]: 336-275-6090 / [address]  [employer]  Career Center (JobLink): 336-373-5922 ([place]) / 336-882-4141 ([place])  [facility] [employer] Community Resource/ Career Center: 336-275-9801 / 336-282-7307  [place] [employer] Job & Career Center: 336-373-3764  DHHS Work First: 336-641-3447 ([place]) / 336-641-3447 ([place])  [facility] [place]:  336-676-5871   Financial Assistance [place] [facility]:  336-553-2657  [employer]: 336-235-0368  [employer] Network (furniture):  336-370-4002  [place] Helping Hands: 336-373-4264  Low Income Energy Assistance  336-641-3000   Food Assistance DHHS- SNAP/ Food Stamps: 336-641-4588  WIC: [place]- 336-641-3663 ;  [place] 336-641-7571  Little Green Book- Free Meals  Little Blue Book- Free Food Pantries  During the summer, text "FOOD" to 877877   General Health / Clinics (Adults) Orange Card (for Adults) through [place] [facility]: (336) 895-4900  [facility]:   336-832-8035  [place] [facility]:   336-832-4444  Health Department:  336-641-3245  [facility]:  336-415-3877 / 336-641-2100  Planned Parenthood of [place]:   336-373-0678  [facility]:   336-334-4822 x 50251   Housing [place] Housing Coalition:   336-691-9521  [place] Housing Authority:  336-275-8501  Affordable Housing Managemnt:  336-273-0568   Immigrant/ Refugee Center for [place] (UNCG):  336-256-1065  Faith Action International House:  336-379-0037  [employer]:  336-937-4701  Church World Services:  336-617-0381  African Services Coalition:  336-574-2677   LGBTQ YouthSAFE  www.youthsafegso.org  PFLAG  336-541-6754 / info@[place].org  The Trevor Project:  1-866-488-7386   Mental Health/ Substance Use Family Service of the [place]  336-387-6161  [place] [facility]:  336-832-9700 or 1-800-711-2635  [place]'s Circle of Care:  336-271-5888  [facility]:  336-294-1349  [facility] Care Services:  336-542-2884    [facility] (walk-ins)  336-676-6840 / [address]  [place]:  800-449-1287  Alcoholics Anonymous:  336-854-4278  Narcotics Anonymous:  800-365-1036  Quit Smoking Hotline:  800-QUIT-NOW (800-784-8669)   Parenting Children's Home Society:  800-632-1400  [place]: Education Center & Support Groups:  336-832-6682  YWCA: 336-273-3461  UNCG: Bringing Out the Best:  336-334-3120               Thriving at Three (Hispanic families): 336-256-1066  Healthy Start (Family Service of the [place]):  336-387-6161 x2288  Parents as Teachers:  336-691-0024  [place] Child Development- Learning Together (Immigrants): 336-369-5001   Poison Control 800-222-1222  Sports & Recreation [employer] Open Doors Application: ymcanwnc.org/join/open-doors-financial-assistance/  City of [place] Recreation Centers: http://www.[place].gov/index.aspx?page=3615   Special Needs Family Support Network:  336-832-6507  Autism Society of [place]:   336-333-0197 x1402 or x1412 /  800-785-1035  [facility]:  336-334-5773  ARC of [place]:  336-373-1076  Children's Developmental Service Agency (CDSA):  336-334-5601  CC4C (Care Coordination for Children):  336-641-7641   Transportation Medicaid Transportation: 336-641-4848 to apply  [place] Transit Authority: 336-335-6499 (reduced-fare bus ID to Medicaid/ Medicare/ Orange Card)  SCAT Paratransit services: Eligible riders only, call 336-333-6589 for application   Tutoring/Mentoring Black Child Development Institute: 336-230-2138  Big Brothers/ Big Sisters: 336-378-9100 ([place])  336-882-4167 ([place])  ACES through child's school: 336-370-2321  [employer] Achievers: contact your local Y  SHIELD Mentor Program: 336-337-2771    
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
## 2017-11-07 ENCOUNTER — Telehealth (INDEPENDENT_AMBULATORY_CARE_PROVIDER_SITE_OTHER): Payer: Self-pay | Admitting: Physician Assistant

## 2017-11-07 ENCOUNTER — Telehealth (INDEPENDENT_AMBULATORY_CARE_PROVIDER_SITE_OTHER): Payer: Self-pay

## 2017-11-07 ENCOUNTER — Other Ambulatory Visit (INDEPENDENT_AMBULATORY_CARE_PROVIDER_SITE_OTHER): Payer: Self-pay | Admitting: Physician Assistant

## 2017-11-07 DIAGNOSIS — M109 Gout, unspecified: Secondary | ICD-10-CM

## 2017-11-07 DIAGNOSIS — F418 Other specified anxiety disorders: Secondary | ICD-10-CM

## 2017-11-07 LAB — RHEUMATOID FACTOR: Rhuematoid fact SerPl-aCnc: <10 IU/mL (ref 0.0–13.9)

## 2017-11-07 LAB — SEDIMENTATION RATE: Sed Rate: 20 mm/hr (ref 0–30)

## 2017-11-07 LAB — C-REACTIVE PROTEIN: CRP: 12.2 mg/L — AB (ref 0.0–4.9)

## 2017-11-07 LAB — ANA W/REFLEX: Anti Nuclear Antibody(ANA): NEGATIVE

## 2017-11-07 LAB — URIC ACID: URIC ACID: 6.7 mg/dL (ref 3.7–8.6)

## 2017-11-07 MED ORDER — ESCITALOPRAM OXALATE 20 MG PO TABS: 20.0000 mg | ORAL_TABLET | Freq: Every day | ORAL | 5 refills | Status: DC

## 2017-11-07 MED ORDER — ARIPIPRAZOLE 2 MG PO TABS: 2.0000 mg | ORAL_TABLET | Freq: Every day | ORAL | 5 refills | Status: DC

## 2017-11-07 MED ORDER — ALLOPURINOL 300 MG PO TABS: 300.0000 mg | ORAL_TABLET | Freq: Every day | ORAL | 6 refills | Status: DC

## 2017-11-07 MED FILL — ARIPiprazole 2 MG TABS: 2 | 30 days supply | Qty: 30 | Fill #0

## 2017-11-07 MED FILL — ESCITALOPRAM 20 MG TABLET: 20 | 30 days supply | Qty: 30 | Fill #0

## 2017-11-07 NOTE — Telephone Encounter (Signed)
Pt called to request a pharmacy change, He would like -escitalopram (LEXAPRO) 20 MG tablet  -ARIPiprazole (ABILIFY) 2 MG tablet  Sent to [facility] because they are over $600 in current pharmacy

## 2017-11-07 NOTE — Telephone Encounter (Signed)
Called patients home number, it is not in service. Called mobile number and left voicemail asking patient to call [facility]. Maryjean Mornempestt S Collin Hendley, CMA

## 2017-11-07 NOTE — Telephone Encounter (Signed)
Please notify patient that I have resent medications to [facility].

## 2017-11-07 NOTE — Telephone Encounter (Signed)
-----   Message from Loletta Specteroger David Gomez, PA-C sent at 11/07/2017 12:32 PM EDT ----- Labs show acute inflammation, negative autoimmune disease, negative rheumatoid factor, and uric acid is nearly controlled. I have sent a stronger dose of [facility]opurinol to his [facility] [address]. He can take OTC Aleve twice a day to help relieve inflammation.

## 2017-11-12 ENCOUNTER — Encounter: Payer: Self-pay | Admitting: Neurology

## 2017-11-12 ENCOUNTER — Telehealth (INDEPENDENT_AMBULATORY_CARE_PROVIDER_SITE_OTHER): Payer: Self-pay

## 2017-11-12 ENCOUNTER — Encounter (INDEPENDENT_AMBULATORY_CARE_PROVIDER_SITE_OTHER): Payer: Self-pay

## 2017-11-12 NOTE — Telephone Encounter (Signed)
Left message informing patient that this was the second attempt to provide lab results. Informed that results will be mailed. Return call to [facility] to be provided results via phone. Maryjean Mornempestt S Kaylina Cahue, CMA

## 2017-11-12 NOTE — Telephone Encounter (Signed)
-----   Message from Roger David Gomez, PA-C sent at 11/07/2017 12:32 PM EDT ----- Labs show acute inflammation, negative autoimmune disease, negative rheumatoid factor, and uric acid is nearly controlled. I have sent a stronger dose of allopurinol to his [facility] [address]. He can take OTC Aleve twice a day to help relieve inflammation. 

## 2017-11-21 ENCOUNTER — Telehealth (INDEPENDENT_AMBULATORY_CARE_PROVIDER_SITE_OTHER): Payer: Self-pay | Admitting: Physician Assistant

## 2017-11-21 NOTE — Telephone Encounter (Signed)
Patient call to inform PCP that he is feeling a little bit better with the medication prescribed and also that he received his blood test back (4) but does not understand and has further questions.  Please follow up [phone]   Thank you
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
## 2017-11-25 NOTE — Telephone Encounter (Signed)
Noted  

## 2017-12-02 MED FILL — ESCITALOPRAM 20 MG TABLET: 20 | 30 days supply | Qty: 30 | Fill #1

## 2017-12-02 MED FILL — ARIPiprazole 2 MG TABS: 2 | 30 days supply | Qty: 30 | Fill #1

## 2017-12-04 ENCOUNTER — Other Ambulatory Visit (INDEPENDENT_AMBULATORY_CARE_PROVIDER_SITE_OTHER): Payer: Self-pay | Admitting: Physician Assistant

## 2017-12-04 ENCOUNTER — Telehealth (INDEPENDENT_AMBULATORY_CARE_PROVIDER_SITE_OTHER): Payer: Self-pay | Admitting: Physician Assistant

## 2017-12-04 DIAGNOSIS — F411 Generalized anxiety disorder: Secondary | ICD-10-CM

## 2017-12-04 MED ORDER — HYDROXYZINE HCL 10 MG PO TABS: 20.0000 mg | ORAL_TABLET | Freq: Three times a day (TID) | ORAL | 0 refills | Status: DC | PRN

## 2017-12-04 MED ORDER — HYDROXYZINE PAMOATE 25 MG PO CAPS: 25.0000 mg | ORAL_CAPSULE | Freq: Three times a day (TID) | ORAL | 3 refills | Status: DC | PRN

## 2017-12-04 MED FILL — hydrOXYzine HCL 10 MG TABS: 10 | 20 days supply | Qty: 120 | Fill #0

## 2017-12-04 NOTE — Telephone Encounter (Signed)
Sorry but controlled medications are not refilled without office visit. Furthermore, he may not be guaranteed Klonopin if he comes for an office visit. I can send hydroxyzine as an alternative anxiolytic if he wishes.

## 2017-12-04 NOTE — Telephone Encounter (Signed)
Patient would like to take Hydroxyzine as an alternative and would like the RX [facility]   [facility]   Please Advise [phone]  Thank you

## 2017-12-04 NOTE — Progress Notes (Signed)
Pt requested benzodiazepine. Contacted patient and told him he would need appt for benzo. He is traveling tomorrow and accepted to take Hydroxyzine instead of benzo.

## 2017-12-04 NOTE — Telephone Encounter (Signed)
Hydroxyzine sent

## 2017-12-04 NOTE — Telephone Encounter (Signed)
Patient called requesting refill for   clonazePAM (KLONOPIN) 0.5 MG tablet  [facility] [address] - [place], [place] - [id number]  Patient states that he is going out of town tomorrow morning and needs his medication.   Please Advice [phone]  Thank You

## 2017-12-10 NOTE — Congregational Nurse Program (Signed)
Congregational Nurse Program Note  Date of Encounter: 12/10/2017  Past Medical History: Past Medical History:  Diagnosis Date  . Depression   . Gout   . Neuromuscular disorder (HCC)   . Thyroid disease     Encounter Details: CNP Questionnaire - 11/20/17 2215      Questionnaire   Patient Status  Not Applicable    Race  White or Caucasian    [facility] Patient S[facility]  [employer] [facility] [employer] [place]    Insurance  Not Applicable    Uninsured  Uninsured (NEW 1x/quarter)    Food  No food insecurities    Housing/Utilities  Yes, have permanent housing    Transportation  No transportation needs    Interpersonal Safety  Yes, feel physically and emotionally safe where you currently live    Medication  No medication insecurities    Medical Provider  Yes    Referrals  Not Applicable    ED Visit Averted  Not Applicable    Life-Saving Intervention Made  Not Applicable      Patient seen at the [facility] B[facility]P 113[address]74 - [address] Jenene SlickerEmma Jaisha Villacres RN, [place] [facility] Program, [phone]

## 2017-12-11 ENCOUNTER — Ambulatory Visit (INDEPENDENT_AMBULATORY_CARE_PROVIDER_SITE_OTHER): Payer: Self-pay | Admitting: Physician Assistant

## 2017-12-13 ENCOUNTER — Other Ambulatory Visit: Payer: Self-pay

## 2017-12-13 NOTE — Telephone Encounter (Signed)
REFILL FOR CLONAZEPAM REQUESTED BY PHARMACY, DENIED BY PROVIDER. CALLED PHARMACY TO NOTIFY OF DENIAL.

## 2017-12-18 ENCOUNTER — Other Ambulatory Visit: Payer: Self-pay | Admitting: Family Medicine

## 2017-12-18 DIAGNOSIS — G4709 Other insomnia: Secondary | ICD-10-CM

## 2017-12-18 NOTE — Telephone Encounter (Signed)
Rec refill request via protocol for patient's Ambien. Cannot process controls. Refills must come from provider. [facility].

## 2017-12-19 ENCOUNTER — Other Ambulatory Visit: Payer: Self-pay

## 2017-12-19 ENCOUNTER — Encounter (INDEPENDENT_AMBULATORY_CARE_PROVIDER_SITE_OTHER): Payer: Self-pay | Admitting: Physician Assistant

## 2017-12-19 ENCOUNTER — Ambulatory Visit (INDEPENDENT_AMBULATORY_CARE_PROVIDER_SITE_OTHER): Payer: Self-pay | Admitting: Physician Assistant

## 2017-12-19 VITALS — BP 121/76 | HR 88 | Temp 98.0°F | Ht 75.0 in | Wt 230.4 lb

## 2017-12-19 DIAGNOSIS — F418 Other specified anxiety disorders: Secondary | ICD-10-CM

## 2017-12-19 DIAGNOSIS — M109 Gout, unspecified: Secondary | ICD-10-CM | POA: Insufficient documentation

## 2017-12-19 DIAGNOSIS — Z114 Encounter for screening for human immunodeficiency virus [HIV]: Secondary | ICD-10-CM

## 2017-12-19 DIAGNOSIS — E039 Hypothyroidism, unspecified: Secondary | ICD-10-CM

## 2017-12-19 DIAGNOSIS — Z760 Encounter for issue of repeat prescription: Secondary | ICD-10-CM

## 2017-12-19 DIAGNOSIS — G4709 Other insomnia: Secondary | ICD-10-CM

## 2017-12-19 MED ORDER — ARIPIPRAZOLE 2 MG PO TABS: 2.0000 mg | ORAL_TABLET | Freq: Every day | ORAL | 5 refills | Status: DC

## 2017-12-19 MED ORDER — ALLOPURINOL 300 MG PO TABS: 300.0000 mg | ORAL_TABLET | Freq: Every day | ORAL | 6 refills | Status: DC

## 2017-12-19 MED ORDER — ROSUVASTATIN CALCIUM 20 MG PO TABS: 20.0000 mg | ORAL_TABLET | Freq: Every day | ORAL | 1 refills | Status: DC

## 2017-12-19 MED ORDER — CLONAZEPAM 0.5 MG PO TABS: 0.5000 mg | ORAL_TABLET | Freq: Every evening | ORAL | 0 refills | Status: DC | PRN

## 2017-12-19 MED ORDER — ZOLPIDEM TARTRATE 5 MG PO TABS: 5.0000 mg | ORAL_TABLET | Freq: Every evening | ORAL | 0 refills | Status: DC | PRN

## 2017-12-19 NOTE — Progress Notes (Signed)
Subjective:  Patient ID: Paul Jefferson, male    DOB: [birth year]  Age: 62 y.o. MRN: [id number]  CC: f/u depression with anxiety  HPI  Paul Jefferson is a 62 y.o. male with a medical history of depression, anxiety, chronic pain syndrome, insomnia, hypothyroidism, HLD, gout, mild PAD, and lower extremity neuropathy presents on f/u for depression with anxiety. Was prescribed Abilify, Lexapro, and Clonazepam last month. Says the medications are helpful and is feeling moderately better. Also sleeping somewhat better. Believes his constant worry is decreasing some. Still has some worries in regards to funeral expenses from his mother's death. Says his siblings are not helpful at all. [facility], Paul Jefferson. She has been helping patient with grief and depression. Would like a refill of clonazepam but at one mg instead of the 0.5 mg dose he was prescribed last time. Says hydroxyzine did not work at all for him and "just dried out my mouth". Patient would like for thyroid levels to be checked since his last check was 02/2017. Feels he has steadily been losing weight.    Still has some pain of the ankle attributed to gout. Last uric acid level one month ago was 6.7 mg/dL. Was prescribed allopurinol 300 mg but patient has not taken due to affordability issues. Does not endorse any other symptoms or complaints.     Outpatient Medications Prior to Visit  Medication Sig Dispense Refill  . acetaminophen-codeine (TYLENOL #3) 300-30 MG tablet Take 1 tablet by mouth every 12 (twelve) hours as needed for moderate pain. 30 tablet 0  . albuterol (VENTOLIN HFA) 108 (90 Base) MCG/ACT inhaler INHALE 2 PUFFS INTO THE LUNGS EVERY 6 HOURS AS NEEDED FOR WHEEZING OR SHORTNESS OF BREATH. 54 g 3  . ARIPiprazole (ABILIFY) 2 MG tablet Take 1 tablet (2 mg total) by mouth daily. 30 tablet 5  . aspirin EC 81 MG tablet Take 1 tablet (81 mg total) by  mouth daily. 30 tablet 0  . clonazePAM (KLONOPIN) 0.5 MG tablet Take 1 tablet (0.5 mg total) by mouth at bedtime as needed for anxiety. 30 tablet 0  . colchicine 0.6 MG tablet Take 1 tablet (0.6 mg total) by mouth daily. 90 tablet 0  . cyclobenzaprine (FLEXERIL) 10 MG tablet TAKE 1 TABLET BY MOUTH 3 TIMES DAILY AS NEEDED FOR MUSCLE SPASMS. 30 tablet 3  . escitalopram (LEXAPRO) 20 MG tablet Take 1 tablet (20 mg total) by mouth daily. 30 tablet 5  . hydrOXYzine (ATARAX/VISTARIL) 10 MG tablet Take 2 tablets (20 mg total) by mouth 3 (three) times daily as needed. 120 tablet 0  . levothyroxine (SYNTHROID, LEVOTHROID) 175 MCG tablet Take 1 tablet (175 mcg total) by mouth daily before breakfast. 30 tablet 0  . pregabalin (LYRICA) 75 MG capsule Take 1 capsule (75 mg total) by mouth 2 (two) times daily. 180 capsule 3  . allopurinol (ZYLOPRIM) 300 MG tablet Take 1 tablet (300 mg total) by mouth daily. (Patient not taking: Reported on 12/19/2017) 30 tablet 6  . DULoxetine (CYMBALTA) 60 MG capsule Take 1 capsule (60 mg total) by mouth daily. (Patient not taking: Reported on 12/19/2017) 30 capsule 3  . rosuvastatin (CRESTOR) 20 MG tablet Take 1 tablet (20 mg total) by mouth daily. (Patient not taking: Reported on 12/19/2017) 90 tablet 0  . sildenafil (VIAGRA) 50 MG tablet Take 1 tablet (50 mg total) by mouth daily as needed for erectile dysfunction. (Patient not taking: Reported on  10/08/2017) 30 tablet 3  . zolpidem (AMBIEN) 5 MG tablet Take 1 tablet (5 mg total) by mouth at bedtime as needed for sleep. 30 tablet 3  . predniSONE (DELTASONE) 20 MG tablet Take 1 tablet (20 mg total) by mouth 2 (two) times daily with a meal. (Patient not taking: Reported on 10/08/2017) 10 tablet 0   No facility-administered medications prior to visit.      ROS Review of Systems  Constitutional: Negative for chills, fever and malaise/fatigue.  Eyes: Negative for blurred vision.  Respiratory: Negative for shortness of breath.    Cardiovascular: Negative for chest pain and palpitations.  Gastrointestinal: Negative for abdominal pain and nausea.  Genitourinary: Negative for dysuria and hematuria.  Musculoskeletal: Negative for joint pain and myalgias.  Skin: Negative for rash.  Neurological: Negative for tingling and headaches.  Psychiatric/Behavioral: Negative for depression. The patient is not nervous/anxious.     Objective:  BP 121/76 (BP Location: Left Arm, Patient Position: Sitting, Cuff Size: Large)   Pulse 88   Temp 98 F (36.7 C) (Oral)   Ht 6\' 3"  (1.905 m)   Wt 230 lb 6.4 oz (104.5 kg)   SpO2 95%   BMI 28.80 kg/m   BP/Weight 12/19/2017 11/06/2017 10/08/2017  Systolic BP 121 121 121  Diastolic BP 76 71 74  Wt. (Lbs) 230.4 239.8 251  BMI 28.8 29.97 31.37      Physical Exam  Constitutional: He is oriented to person, place, and time.  Well developed, well nourished, NAD, polite  HENT:  Head: Normocephalic and atraumatic.  Eyes: No scleral icterus.  Neck: Normal range of motion. Neck supple. No thyromegaly present.  Cardiovascular: Normal rate, regular rhythm and normal heart sounds.  Pulmonary/Chest: Effort normal and breath sounds normal.  Musculoskeletal: He exhibits no edema.  Neurological: He is alert and oriented to person, place, and time.  Skin: Skin is warm and dry. No rash noted. No erythema. No pallor.  Psychiatric: His behavior is normal. Thought content normal.  Depressed, constricted affect, tearful at times during interview, thoughts linear.  Vitals reviewed.    Assessment & Plan:   1. Depression with anxiety - Refill clonazePAM (KLONOPIN) 0.5 MG tablet; Take 1 tablet (0.5 mg total) by mouth at bedtime as needed for anxiety.  Dispense: 30 tablet; Refill: 0 - Refill ARIPiprazole (ABILIFY) 2 MG tablet; Take 1 tablet (2 mg total) by mouth daily.  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   2. Gout of ankle, unspecified cause, unspecified chronicity, unspecified laterality - Refill allopurinol (ZYLOPRIM) 300 MG tablet; Take 1 tablet (300 mg total) by mouth daily.  Dispense: 30 tablet; Refill: 6  3. Hypothyroidism, unspecified type - Thyroid Panel With TSH  4. Other insomnia - Refill zolpidem (AMBIEN) 5 MG tablet; Take 1 tablet (5 mg total) by mouth at bedtime as needed for sleep.  Dispense: 30 tablet; Refill: 0 - Plan to eliminate this from treatment regimen once SSRI is at maximum efficacy for patient.   5. Screening for HIV (human immunodeficiency virus) - HIV antibody  6. Medication refill - rosuvastatin (CRESTOR) 20 MG tablet; Take 1 tablet (20 mg total) by mouth daily.  Dispense: 90 tablet; Refill: 1   Meds ordered this encounter  Medications  . allopurinol (ZYLOPRIM) 300 MG tablet    Sig: Take 1 tablet (300 mg total) by mouth daily.  Dispense:  30 tablet    Refill:  6    Order Specific Question:   Supervising Provider    Answer:   Hoy Register [[id number]]  . zolpidem (AMBIEN) 5 MG tablet    Sig: Take 1 tablet (5 mg total) by mouth at bedtime as needed for sleep.    Dispense:  30 tablet    Refill:  0    Order Specific Question:   Supervising Provider    Answer:   Hoy Register [[id number]]  . DISCONTD: ARIPiprazole (ABILIFY) 2 MG tablet    Sig: Take 1 tablet (2 mg total) by mouth daily.    Dispense:  30 tablet    Refill:  5    Order Specific Question:   Supervising Provider    Answer:   Hoy Register [[id number]]  . clonazePAM (KLONOPIN) 0.5 MG tablet    Sig: Take 1 tablet (0.5 mg total) by mouth at bedtime as needed for anxiety.    Dispense:  30 tablet    Refill:  0    Discontinue previous dose    Order Specific Question:   Supervising Provider    Answer:   Hoy Register [[id number]]  . rosuvastatin (CRESTOR) 20 MG tablet    Sig: Take 1 tablet (20 mg total) by mouth daily.    Dispense:  90 tablet    Refill:  1    Order Specific Question:    Supervising Provider    Answer:   Hoy Register [[id number]]  . ARIPiprazole (ABILIFY) 2 MG tablet    Sig: Take 1 tablet (2 mg total) by mouth daily.    Dispense:  30 tablet    Refill:  5    Order Specific Question:   Supervising Provider    Answer:   Hoy Register [[id number]]    Follow-up: Return in about 1 month (around 01/16/2018) for depression with anxiety.   Loletta Specter PA

## 2017-12-19 NOTE — Patient Instructions (Signed)

## 2017-12-20 ENCOUNTER — Telehealth (INDEPENDENT_AMBULATORY_CARE_PROVIDER_SITE_OTHER): Payer: Self-pay

## 2017-12-20 ENCOUNTER — Ambulatory Visit: Payer: Self-pay | Admitting: Neurology

## 2017-12-20 ENCOUNTER — Other Ambulatory Visit (INDEPENDENT_AMBULATORY_CARE_PROVIDER_SITE_OTHER): Payer: Self-pay | Admitting: Physician Assistant

## 2017-12-20 DIAGNOSIS — E039 Hypothyroidism, unspecified: Secondary | ICD-10-CM

## 2017-12-20 LAB — THYROID PANEL WITH TSH
Free Thyroxine Index: 2.9 (ref 1.2–4.9)
T3 UPTAKE RATIO: 30 % (ref 24–39)
T4, Total: 9.6 ug/dL (ref 4.5–12.0)
TSH: 0.562 u[IU]/mL (ref 0.450–4.500)

## 2017-12-20 LAB — HIV ANTIBODY (ROUTINE TESTING W REFLEX): HIV Screen 4th Generation wRfx: NONREACTIVE

## 2017-12-20 MED ORDER — LEVOTHYROXINE SODIUM 175 MCG PO TABS: 175.0000 ug | ORAL_TABLET | Freq: Every day | ORAL | 0 refills | Status: DC

## 2017-12-20 MED FILL — ESCITALOPRAM 20 MG TABLET: 20 | 30 days supply | Qty: 30 | Fill #2

## 2017-12-20 NOTE — Telephone Encounter (Signed)
-----   Message from Roger David Gomez, PA-C sent at 12/20/2017 12:20 PM EDT ----- Thyroid is stable, I have sent a refill of the same dose to his [facility] on [address]. HIV negative. 

## 2017-12-20 NOTE — Telephone Encounter (Signed)
Called patients mobile number twice and received an error message that could not be completed as dialed. Called home number and it is disconnected. Will call again. Maryjean Mornempestt S Zilphia Kozinski, CMA

## 2017-12-20 NOTE — Progress Notes (Deleted)
[facility] Clinic Note - Initial Visit   Date: 12/20/17  Paul CapersJames Mark Jefferson MRN: [id number] DOB: 1956-02-02   Dear Dr Marland Kitchen***Lily KocherGomez, Maura Crandalloger David, PA-C:  Thank you for your kind referral of Paul Jefferson for consultation of ***. Although *** history is well known to you, please allow us to reiterate it for the purpose of our medical record. The patient was accompanied to the clinic by *** who also provides collateral information.     History of Present Illness: Paul Jefferson is a 62 y.o. ***-handed Caucasian/*** ***male with *** presenting for evaluation of ***.    Out-side paper records, electronic medical record, and images have been reviewed where available and summarized as: ***  Past Medical History:  Diagnosis Date  . Depression   . Gout   . Neuromuscular disorder (HCC)   . Thyroid disease     Past Surgical History:  Procedure Laterality Date  . CHOLECYSTECTOMY       Medications:  Outpatient Encounter Medications as of 12/20/2017  Medication Sig Note  . acetaminophen-codeine (TYLENOL #3) 300-30 MG tablet Take 1 tablet by mouth every 12 (twelve) hours as needed for moderate pain. 12/19/2017: PRN  . albuterol (VENTOLIN HFA) 108 (90 Base) MCG/ACT inhaler INHALE 2 PUFFS INTO THE LUNGS EVERY 6 HOURS AS NEEDED FOR WHEEZING OR SHORTNESS OF BREATH.   Marland Kitchen. allopurinol (ZYLOPRIM) 300 MG tablet Take 1 tablet (300 mg total) by mouth daily.   . ARIPiprazole (ABILIFY) 2 MG tablet Take 1 tablet (2 mg total) by mouth daily.   Marland Kitchen. aspirin EC 81 MG tablet Take 1 tablet (81 mg total) by mouth daily.   . clonazePAM (KLONOPIN) 0.5 MG tablet Take 1 tablet (0.5 mg total) by mouth at bedtime as needed for anxiety.   . colchicine 0.6 MG tablet Take 1 tablet (0.6 mg total) by mouth daily.   Marland Kitchen. escitalopram (LEXAPRO) 20 MG tablet Take 1 tablet (20 mg total) by mouth daily.   Marland Kitchen. levothyroxine (SYNTHROID, LEVOTHROID) 175 MCG tablet Take 1 tablet (175 mcg total) by mouth  daily before breakfast.   . pregabalin (LYRICA) 75 MG capsule Take 1 capsule (75 mg total) by mouth 2 (two) times daily.   . rosuvastatin (CRESTOR) 20 MG tablet Take 1 tablet (20 mg total) by mouth daily.   Marland Kitchen. zolpidem (AMBIEN) 5 MG tablet Take 1 tablet (5 mg total) by mouth at bedtime as needed for sleep.    No facility-administered encounter medications on file as of 12/20/2017.      Allergies:  Allergies  Allergen Reactions  . Mobic [Meloxicam] Nausea Only  . Penicillins Nausea And Vomiting    Family History: No family history on file.  Social History: Social History   Tobacco Use  . Smoking status: Current Every Day Smoker    Packs/day: 0.50    Types: Cigarettes  . Smokeless tobacco: Never Used  Substance Use Topics  . Alcohol use: No  . Drug use: No   Social History   Social History Narrative  . Not on file    Review of Systems:  CONSTITUTIONAL: No fevers, chills, night sweats, or weight loss.  *** EYES: No visual changes or eye pain ENT: No hearing changes.  No history of nose bleeds.   RESPIRATORY: No cough, wheezing and shortness of breath.   CARDIOVASCULAR: Negative for chest pain, and palpitations.   GI: Negative for abdominal discomfort, blood in stools or black stools.  No recent change in bowel habits.  GU:  No history of incontinence.   MUSCLOSKELETAL: No history of joint pain or swelling.  No myalgias.   SKIN: Negative for lesions, rash, and itching.   HEMATOLOGY/ONCOLOGY: Negative for prolonged bleeding, bruising easily, and swollen nodes.  No history of cancer.   ENDOCRINE: Negative for cold or heat intolerance, polydipsia or goiter.   PSYCH:  ***depression or anxiety symptoms.   NEURO: As Above.   Vital Signs:  There were no vitals taken for this visit. Pain Scale: *** on a scale of 0-10   General Medical Exam:  *** General:  Well appearing, comfortable.   Eyes/ENT: see cranial nerve examination.   Neck: No masses appreciated.  Full range  of motion without tenderness.  No carotid bruits. Respiratory:  Clear to auscultation, good air entry bilaterally.   Cardiac:  Regular rate and rhythm, no murmur.   Extremities:  No deformities, edema, or skin discoloration.  Skin:  No rashes or lesions.  Neurological Exam: MENTAL STATUS including orientation to time, place, person, recent and remote memory, attention span and concentration, language, and fund of knowledge is ***normal.  Speech is not dysarthric.  CRANIAL NERVES: II:  No visual field defects.  Unremarkable fundi.   III-IV-VI: Pupils equal round and reactive to light.  Normal conjugate, extra-ocular eye movements in all directions of gaze.  No nystagmus.  No ptosis***.   V:  Normal facial sensation.  Jaw jerk is ***.   VII:  Normal facial symmetry and movements.  No pathologic facial reflexes.  VIII:  Normal hearing and vestibular function.   IX-X:  Normal palatal movement.   XI:  Normal shoulder shrug and head rotation.   XII:  Normal tongue strength and range of motion, no deviation or fasciculation.  MOTOR:  No atrophy, fasciculations or abnormal movements.  No pronator drift.  Tone is normal.    Right Upper Extremity:    Left Upper Extremity:    Deltoid  5/5   Deltoid  5/5   Biceps  5/5   Biceps  5/5   Triceps  5/5   Triceps  5/5   Wrist extensors  5/5   Wrist extensors  5/5   Wrist flexors  5/5   Wrist flexors  5/5   Finger extensors  5/5   Finger extensors  5/5   Finger flexors  5/5   Finger flexors  5/5   Dorsal interossei  5/5   Dorsal interossei  5/5   Abductor pollicis  5/5   Abductor pollicis  5/5   Tone (Ashworth scale)  0  Tone (Ashworth scale)  0   Right Lower Extremity:    Left Lower Extremity:    Hip flexors  5/5   Hip flexors  5/5   Hip extensors  5/5   Hip extensors  5/5   Knee flexors  5/5   Knee flexors  5/5   Knee extensors  5/5   Knee extensors  5/5   Dorsiflexors  5/5   Dorsiflexors  5/5   Plantarflexors  5/5   Plantarflexors  5/5   Toe  extensors  5/5   Toe extensors  5/5   Toe flexors  5/5   Toe flexors  5/5   Tone (Ashworth scale)  0  Tone (Ashworth scale)  0   MSRs:  Right  Left brachioradialis 2+  brachioradialis 2+  biceps 2+  biceps 2+  triceps 2+  triceps 2+  patellar 2+  patellar 2+  ankle jerk 2+  ankle jerk 2+  Hoffman no  Hoffman no  plantar response down  plantar response down   SENSORY:  Normal and symmetric perception of light touch, pinprick, vibration, and proprioception.  Romberg's sign absent.   COORDINATION/GAIT: Normal finger-to- nose-finger and heel-to-shin.  Intact rapid alternating movements bilaterally.  Able to rise from a chair without using arms.  Gait narrow based and stable. Tandem and stressed gait intact.    IMPRESSION: ***  PLAN/RECOMMENDATIONS:  *** Return to clinic in *** months.   The duration of this appointment visit was *** minutes of face-to-face time with the patient.  Greater than 50% of this time was spent in counseling, explanation of diagnosis, planning of further management, and coordination of care.   Thank you for allowing me to participate in patient's care.  If I can answer any additional questions, I would be pleased to do so.    Sincerely,    Peony Barner K. Allena KatzPatel, DO

## 2017-12-23 ENCOUNTER — Encounter (INDEPENDENT_AMBULATORY_CARE_PROVIDER_SITE_OTHER): Payer: Self-pay

## 2017-12-23 ENCOUNTER — Telehealth (INDEPENDENT_AMBULATORY_CARE_PROVIDER_SITE_OTHER): Payer: Self-pay

## 2017-12-23 NOTE — Telephone Encounter (Signed)
-----   Message from Loletta Specteroger David Gomez, PA-C sent at 12/20/2017 12:20 PM EDT ----- Thyroid is stable, I have sent a refill of the same dose to his [facility] on [address]. HIV negative.

## 2017-12-23 NOTE — Telephone Encounter (Signed)
Called both numbers on file for patient and received same error messages. Results have been mailed. Paul Jefferson, CMA

## 2017-12-24 ENCOUNTER — Ambulatory Visit: Payer: Self-pay | Attending: Physician Assistant | Admitting: Licensed Clinical Social Worker

## 2017-12-24 DIAGNOSIS — Z634 Disappearance and death of family member: Secondary | ICD-10-CM

## 2017-12-24 DIAGNOSIS — F419 Anxiety disorder, unspecified: Secondary | ICD-10-CM

## 2017-12-24 NOTE — BH Specialist Note (Signed)
Integrated Behavioral Health Follow Up Visit  MRN: [id number] Name: Paul Jefferson  Number of Integrated Behavioral Health Clinician visits: 3/6 Session Start time: 11:45 AM  Session End time: 12:15 PM Total time: 30 minutes  Type of Service: Integrated Behavioral Health- Individual/Family Interpretor:No. Interpretor Name and Language: N/A  SUBJECTIVE: Paul Jefferson is a 62 y.o. male accompanied by self Patient was referred by Conley SimmondsPA-C Gomez for depression and anxiety. Patient reports the following symptoms/concerns: feelings of sadness and worry, decreased concentration, irritability, and low energy Duration of problem: Ongoing; Severity of problem: moderate  OBJECTIVE: Mood: Appropriate and Affect: Appropriate Risk of harm to self or others: No plan to harm self or others  LIFE CONTEXT: Family and Social: Pt resides alone School/Work: Pt is living off of a settlement from over five years ago. He has no additional income and applies for financial assistance Self-Care: Pt participates in medication management through PCP Life Changes: Pt is grieving the loss of his mother and is in the process of having his home foreclosed due to financial strain.  GOALS ADDRESSED: Patient will: 1.  Reduce symptoms of: anxiety and depression  2.  Increase knowledge and/or ability of: self-management skills  3.  Demonstrate ability to: Increase healthy adjustment to current life circumstances and Increase adequate support systems for patient/family  INTERVENTIONS: Interventions utilized:  Solution-Focused Strategies, Supportive Counseling and Link to [facility] Standardized Assessments completed: Not Needed  ASSESSMENT: Patient currently experiencing depression and anxiety triggered by the loss of his mother and financial strain, resulting in the current foreclosure of his house. He is participating in medicine management through PCP and states that it has improved his sleep, anxiety,  and weight loss. No report of SI/HI/AVH.  LCSWA educated pt on the stages of grief and discussed the importance of applying healthy coping skills. Support and encouragement were provided. Pt has an upcoming court date next month. He is still participating in services with Legal [employer] for assistance in mortgage foreclosure. LCSWA provided supportive resources to assist pt in obtaining part-time employment, as requested by pt. He was appreciative and stated that he plans on continuing services with Hospice for individual grief counseling.   PLAN: 1. Follow up with behavioral health clinician on : Pt was encouraged to contact LCSWA if symptoms worsen or fail to improve to [facility] 2. Behavioral recommendations: LCSWA recommends that pt apply healthy coping skills discussed, comply with medication management, and utilize provided resources. Pt is encouraged to schedule follow up appointment with LCSWA 3. Referral(s): Integrated [facility]al [facility] Services (In Clinic) and [employer] [facility]:  Finances 4. "From scale of 1-10, how likely are you to follow plan?":   Bridgett LarssonJasmine D Colbert Curenton, LCSW 12/26/17 4:52 PM

## 2017-12-25 ENCOUNTER — Ambulatory Visit: Payer: Self-pay

## 2017-12-25 ENCOUNTER — Other Ambulatory Visit: Payer: Self-pay | Admitting: Family Medicine

## 2017-12-25 DIAGNOSIS — E039 Hypothyroidism, unspecified: Secondary | ICD-10-CM

## 2017-12-30 ENCOUNTER — Other Ambulatory Visit: Payer: Self-pay | Admitting: Family Medicine

## 2017-12-30 DIAGNOSIS — E039 Hypothyroidism, unspecified: Secondary | ICD-10-CM

## 2017-12-30 MED FILL — SYNTHROID 175 MCG TABLET: 175 | 30 days supply | Qty: 30 | Fill #0

## 2017-12-30 MED FILL — ARIPiprazole 2 MG TABS: 2 | 30 days supply | Qty: 30 | Fill #0

## 2018-01-07 ENCOUNTER — Ambulatory Visit: Payer: Self-pay | Admitting: Family Medicine

## 2018-01-16 ENCOUNTER — Other Ambulatory Visit: Payer: Self-pay

## 2018-01-16 ENCOUNTER — Ambulatory Visit (INDEPENDENT_AMBULATORY_CARE_PROVIDER_SITE_OTHER): Payer: Self-pay | Admitting: Physician Assistant

## 2018-01-16 ENCOUNTER — Encounter (INDEPENDENT_AMBULATORY_CARE_PROVIDER_SITE_OTHER): Payer: Self-pay | Admitting: Physician Assistant

## 2018-01-16 VITALS — BP 100/69 | HR 79 | Temp 98.1°F | Ht 75.0 in | Wt 226.4 lb

## 2018-01-16 DIAGNOSIS — Z131 Encounter for screening for diabetes mellitus: Secondary | ICD-10-CM

## 2018-01-16 DIAGNOSIS — G4709 Other insomnia: Secondary | ICD-10-CM

## 2018-01-16 DIAGNOSIS — R351 Nocturia: Secondary | ICD-10-CM

## 2018-01-16 DIAGNOSIS — F418 Other specified anxiety disorders: Secondary | ICD-10-CM

## 2018-01-16 DIAGNOSIS — Z23 Encounter for immunization: Secondary | ICD-10-CM

## 2018-01-16 LAB — POCT GLYCOSYLATED HEMOGLOBIN (HGB A1C): HEMOGLOBIN A1C: 5.5 % (ref 4.0–5.6)

## 2018-01-16 MED ORDER — ESCITALOPRAM OXALATE 20 MG PO TABS: 20.0000 mg | ORAL_TABLET | Freq: Every day | ORAL | 5 refills | Status: DC

## 2018-01-16 MED ORDER — ZOLPIDEM TARTRATE 5 MG PO TABS: 5.0000 mg | ORAL_TABLET | Freq: Every evening | ORAL | 0 refills | Status: DC | PRN

## 2018-01-16 MED ORDER — ARIPIPRAZOLE 2 MG PO TABS: 2.0000 mg | ORAL_TABLET | Freq: Every day | ORAL | 5 refills | Status: DC

## 2018-01-16 MED ORDER — CLONAZEPAM 0.25 MG PO TBDP: 0.2500 mg | ORAL_TABLET | Freq: Two times a day (BID) | ORAL | 0 refills | Status: DC

## 2018-01-16 MED FILL — ESCITALOPRAM 20 MG TABLET: 20 | 30 days supply | Qty: 30 | Fill #3

## 2018-01-16 NOTE — Progress Notes (Signed)
Subjective:  Patient ID: Paul Jefferson, male    DOB: [birth year]  Age: 62 y.o. MRN: [id number]  CC: f/u depression with anxiety  HPI  Paul Jefferson a 62 y.o.malewith a medical history of depression, anxiety, chronic pain syndrome, insomnia, hypothyroidism, HLD, gout, mild PAD, and lower extremity neuropathy presents on f/u for depression with anxiety. Was prescribed Abilify, Lexapro, and Clonazepam two months ago. He is still working on obtaining CAFA; awaiting approval. Says medications "seem to be working". He is feeling better than in previous months. Has begun to spend time with friends and family as opposed to isolating himself. PHQ9 4 and GAD7 4 today which is an increase of one point on each score over last month. Only complaint today is of nocturia x2 for approximately 10 years. No perineal pain, dysuria, low back pain. Does not endorse CP, palpitations, SOB, HA, abdominal pain, f/c/n/v, rash, edema, or GI sxs.     Outpatient Medications Prior to Visit  Medication Sig Dispense Refill  . albuterol (VENTOLIN HFA) 108 (90 Base) MCG/ACT inhaler INHALE 2 PUFFS INTO THE LUNGS EVERY 6 HOURS AS NEEDED FOR WHEEZING OR SHORTNESS OF BREATH. 54 g 3  . allopurinol (ZYLOPRIM) 300 MG tablet Take 1 tablet (300 mg total) by mouth daily. 30 tablet 6  . ARIPiprazole (ABILIFY) 2 MG tablet Take 1 tablet (2 mg total) by mouth daily. 30 tablet 5  . aspirin EC 81 MG tablet Take 1 tablet (81 mg total) by mouth daily. 30 tablet 0  . clonazePAM (KLONOPIN) 0.5 MG tablet Take 1 tablet (0.5 mg total) by mouth at bedtime as needed for anxiety. 30 tablet 0  . colchicine 0.6 MG tablet Take 1 tablet (0.6 mg total) by mouth daily. 90 tablet 0  . escitalopram (LEXAPRO) 20 MG tablet Take 1 tablet (20 mg total) by mouth daily. 30 tablet 5  . levothyroxine (SYNTHROID, LEVOTHROID) 175 MCG tablet Take 1 tablet (175 mcg total) by mouth daily before breakfast. 30 tablet 0  . pregabalin (LYRICA) 75 MG capsule Take 1  capsule (75 mg total) by mouth 2 (two) times daily. 180 capsule 3  . rosuvastatin (CRESTOR) 20 MG tablet Take 1 tablet (20 mg total) by mouth daily. 90 tablet 1  . zolpidem (AMBIEN) 5 MG tablet Take 1 tablet (5 mg total) by mouth at bedtime as needed for sleep. 30 tablet 0  . acetaminophen-codeine (TYLENOL #3) 300-30 MG tablet Take 1 tablet by mouth every 12 (twelve) hours as needed for moderate pain. (Patient not taking: Reported on 01/16/2018) 30 tablet 0  . VIAGRA 50 MG tablet Take 50 mg by mouth as needed.  3   No facility-administered medications prior to visit.      ROS Review of Systems  Constitutional: Negative for chills, fever and malaise/fatigue.  Eyes: Negative for blurred vision.  Respiratory: Negative for shortness of breath.   Cardiovascular: Negative for chest pain and palpitations.  Gastrointestinal: Negative for abdominal pain and nausea.  Genitourinary: Negative for dysuria and hematuria.  Musculoskeletal: Negative for joint pain and myalgias.  Skin: Negative for rash.  Neurological: Negative for tingling and headaches.  Psychiatric/Behavioral: Positive for depression. The patient is nervous/anxious and has insomnia.     Objective:  Ht 6\' 3"  (1.905 m)   Wt 226 lb 6.4 oz (102.7 kg)   BMI 28.30 kg/m   Vitals:   01/16/18 1354  BP: 100/69  Pulse: 79  Temp: 98.1 F (36.7 C)  SpO2: 97%  Physical Exam  Constitutional: He is oriented to person, place, and time.  Well developed, well nourished, NAD, polite  HENT:  Head: Normocephalic and atraumatic.  Eyes: No scleral icterus.  Neck: Normal range of motion. Neck supple. No thyromegaly present.  Cardiovascular: Normal rate, regular rhythm and normal heart sounds.  Pulmonary/Chest: Effort normal and breath sounds normal.  Musculoskeletal: He exhibits no edema.  Neurological: He is alert and oriented to person, place, and time.  Skin: Skin is warm and dry. No rash noted. No erythema. No pallor.   Psychiatric: He has a normal mood and affect. His behavior is normal. Thought content normal.  Vitals reviewed.    Assessment & Plan:    1. Depression with anxiety - Refill escitalopram (LEXAPRO) 20 MG tablet; Take 1 tablet (20 mg total) by mouth daily.  Dispense: 30 tablet; Refill: 5 - Decrease clonazePAM (KLONOPIN) 0.25 MG disintegrating tablet; Take 1 tablet (0.25 mg total) by mouth 2 (two) times daily.  Dispense: 60 tablet; Refill: 0 - Refill ARIPiprazole (ABILIFY) 2 MG tablet; Take 1 tablet (2 mg total) by mouth daily.  Dispense: 30 tablet; Refill: 5 - Ambulatory referral to Psychiatry  2. Other insomnia - Refill zolpidem (AMBIEN) 5 MG tablet; Take 1 tablet (5 mg total) by mouth at bedtime as needed for sleep.  Dispense: 30 tablet; Refill: 0  3. Nocturia - PSA - Basic Metabolic Panel  4. Flu vaccine need - Flu Vaccine QUAD 6+ mos PF IM (Fluarix Quad PF)  5. Screening for diabetes mellitus - HgB A1c 5.5% today.  Meds ordered this encounter  Medications  . zolpidem (AMBIEN) 5 MG tablet    Sig: Take 1 tablet (5 mg total) by mouth at bedtime as needed for sleep.    Dispense:  30 tablet    Refill:  0    Order Specific Question:   Supervising Provider    Answer:   Hoy RegisterNEWLIN, ENOBONG [[id number]]  . escitalopram (LEXAPRO) 20 MG tablet    Sig: Take 1 tablet (20 mg total) by mouth daily.    Dispense:  30 tablet    Refill:  5    Order Specific Question:   Supervising Provider    Answer:   Hoy RegisterNEWLIN, ENOBONG [[id number]]  . clonazePAM (KLONOPIN) 0.25 MG disintegrating tablet    Sig: Take 1 tablet (0.25 mg total) by mouth 2 (two) times daily.    Dispense:  60 tablet    Refill:  0    Order Specific Question:   Supervising Provider    Answer:   Hoy RegisterNEWLIN, ENOBONG [[id number]]  . ARIPiprazole (ABILIFY) 2 MG tablet    Sig: Take 1 tablet (2 mg total) by mouth daily.    Dispense:  30 tablet    Refill:  5    Order Specific Question:   Supervising Provider    Answer:   Hoy RegisterNEWLIN, ENOBONG [[id number]]     Follow-up: Return in about 6 months (around 07/19/2018) for earlier if shingles vaccine desired.Loletta Specter.   Paul David Gomez PA

## 2018-01-16 NOTE — Patient Instructions (Addendum)
Living With Depression Everyone experiences occasional disappointment, sadness, and loss in their lives. When you are feeling down, blue, or sad for at least 2 weeks in a row, it may mean that you have depression. Depression can affect your thoughts and feelings, relationships, daily activities, and physical health. It is caused by changes in the way your brain functions. If you receive a diagnosis of depression, your health care provider will tell you which type of depression you have and what treatment options are available to you. If you are living with depression, there are ways to help you recover from it and also ways to prevent it from coming back. How to cope with lifestyle changes Coping with stress Stress is your body's reaction to life changes and events, both good and bad. Stressful situations may include:  Getting married.  The death of a spouse.  Losing a job.  Retiring.  Having a baby.  Stress can last just a few hours or it can be ongoing. Stress can play a major role in depression, so it is important to learn both how to cope with stress and how to think about it differently. Talk with your health care provider or a counselor if you would like to learn more about stress reduction. He or she may suggest some stress reduction techniques, such as:  Music therapy. This can include creating music or listening to music. Choose music that you enjoy and that inspires you.  Mindfulness-based meditation. This kind of meditation can be done while sitting or walking. It involves being aware of your normal breaths, rather than trying to control your breathing.  Centering prayer. This is a kind of meditation that involves focusing on a spiritual word or phrase. Choose a word, phrase, or sacred image that is meaningful to you and that brings you peace.  Deep breathing. To do this, expand your stomach and inhale slowly through your nose. Hold your breath for 3-5 seconds, then exhale  slowly, allowing your stomach muscles to relax.  Muscle relaxation. This involves intentionally tensing muscles then relaxing them.  Choose a stress reduction technique that fits your lifestyle and personality. Stress reduction techniques take time and practice to develop. Set aside 5-15 minutes a day to do them. Therapists can offer training in these techniques. The training may be covered by some insurance plans. Other things you can do to manage stress include:  Keeping a stress diary. This can help you learn what triggers your stress and ways to control your response.  Understanding what your limits are and saying no to requests or events that lead to a schedule that is too full.  Thinking about how you respond to certain situations. You may not be able to control everything, but you can control how you react.  Adding humor to your life by watching funny films or TV shows.  Making time for activities that help you relax and not feeling guilty about spending your time this way.  Medicines Your health care provider may suggest certain medicines if he or she feels that they will help improve your condition. Avoid using alcohol and other substances that may prevent your medicines from working properly (may interact). It is also important to:  Talk with your pharmacist or health care provider about all the medicines that you take, their possible side effects, and what medicines are safe to take together.  Make it your goal to take part in all treatment decisions (shared decision-making). This includes giving input on the side   effects of medicines. It is best if shared decision-making with your health care provider is part of your total treatment plan.  If your health care provider prescribes a medicine, you may not notice the full benefits of it for 4-8 weeks. Most people who are treated for depression need to be on medicine for at least 6-12 months after they feel better. If you are taking  medicines as part of your treatment, do not stop taking medicines without first talking to your health care provider. You may need to have the medicine slowly decreased (tapered) over time to decrease the risk of harmful side effects. Relationships Your health care provider may suggest family therapy along with individual therapy and drug therapy. While there may not be family problems that are causing you to feel depressed, it is still important to make sure your family learns as much as they can about your mental health. Having your family's support can help make your treatment successful. How to recognize changes in your condition Everyone has a different response to treatment for depression. Recovery from major depression happens when you have not had signs of major depression for two months. This may mean that you will start to:  Have more interest in doing activities.  Feel less hopeless than you did 2 months ago.  Have more energy.  Overeat less often, or have better or improving appetite.  Have better concentration.  Your health care provider will work with you to decide the next steps in your recovery. It is also important to recognize when your condition is getting worse. Watch for these signs:  Having fatigue or low energy.  Eating too much or too little.  Sleeping too much or too little.  Feeling restless, agitated, or hopeless.  Having trouble concentrating or making decisions.  Having unexplained physical complaints.  Feeling irritable, angry, or aggressive.  Get help as soon as you or your family members notice these symptoms coming back. How to get support and help from others How to talk with friends and family members about your condition Talking to friends and family members about your condition can provide you with one way to get support and guidance. Reach out to trusted friends or family members, explain your symptoms to them, and let them know that you are  working with a health care provider to treat your depression. Financial resources Not all insurance plans cover mental health care, so it is important to check with your insurance carrier. If paying for co-pays or counseling services is a problem, search for a local or county mental health care center. They may be able to offer public mental health care services at low or no cost when you are not able to see a private health care provider. If you are taking medicine for depression, you may be able to get the generic form, which may be less expensive. Some makers of prescription medicines also offer help to patients who cannot afford the medicines they need. Follow these instructions at home:  Get the right amount and quality of sleep.  Cut down on using caffeine, tobacco, alcohol, and other potentially harmful substances.  Try to exercise, such as walking or lifting small weights.  Take over-the-counter and prescription medicines only as told by your health care provider.  Eat a healthy diet that includes plenty of vegetables, fruits, whole grains, low-fat dairy products, and lean protein. Do not eat a lot of foods that are high in solid fats, added sugars, or salt.    Keep all follow-up visits as told by your health care provider. This is important. Contact a health care provider if:  You stop taking your antidepressant medicines, and you have any of these symptoms: ? Nausea. ? Headache. ? Feeling lightheaded. ? Chills and body aches. ? Not being able to sleep (insomnia).  You or your friends and family think your depression is getting worse. Get help right away if:  You have thoughts of hurting yourself or others. If you ever feel like you may hurt yourself or others, or have thoughts about taking your own life, get help right away. You can go to your nearest emergency department or call:  Your local emergency services (911 in the [place]  A suicide crisis helpline, such as the  National Suicide Prevention Lifeline at 1-800-273-8255. This is open 24-hours a day.  Summary  If you are living with depression, there are ways to help you recover from it and also ways to prevent it from coming back.  Work with your health care team to create a management plan that includes counseling, stress management techniques, and healthy lifestyle habits. This information is not intended to replace advice given to you by your health care provider. Make sure you discuss any questions you have with your health care provider. Document Released: 04/23/2016 Document Revised: 04/23/2016 Document Reviewed: 04/23/2016 Elsevier Interactive Patient Education  2018 Elsevier Inc.  

## 2018-01-17 ENCOUNTER — Telehealth (INDEPENDENT_AMBULATORY_CARE_PROVIDER_SITE_OTHER): Payer: Self-pay

## 2018-01-17 ENCOUNTER — Other Ambulatory Visit (INDEPENDENT_AMBULATORY_CARE_PROVIDER_SITE_OTHER): Payer: Self-pay | Admitting: Physician Assistant

## 2018-01-17 LAB — BASIC METABOLIC PANEL
BUN / CREAT RATIO: 18 (ref 10–24)
BUN: 16 mg/dL (ref 8–27)
CO2: 20 mmol/L (ref 20–29)
CREATININE: 0.9 mg/dL (ref 0.76–1.27)
Calcium: 9.7 mg/dL (ref 8.6–10.2)
Chloride: 101 mmol/L (ref 96–106)
GFR calc non Af Amer: 91 mL/min/{1.73_m2} (ref >59)
GFR, EST AFRICAN AMERICAN: 105 mL/min/{1.73_m2} (ref >59)
GLUCOSE: 79 mg/dL (ref 65–99)
Potassium: 4.7 mmol/L (ref 3.5–5.2)
SODIUM: 134 mmol/L (ref 134–144)

## 2018-01-17 LAB — PSA: Prostate Specific Ag, Serum: 0.5 ng/mL (ref 0.0–4.0)

## 2018-01-17 MED ORDER — CLONAZEPAM 0.5 MG PO TABS: 0.2500 mg | ORAL_TABLET | Freq: Two times a day (BID) | ORAL | 0 refills | Status: DC | PRN

## 2018-01-17 NOTE — Telephone Encounter (Signed)
Patient states that new Rx for Clonazepam is $50. He is requesting to have new Rx for Clonazepam as it was before. Maryjean Mornempestt S Roberts, CMA

## 2018-01-17 NOTE — Telephone Encounter (Signed)
Patient aware. Tempestt S Roberts, CMA  

## 2018-01-17 NOTE — Telephone Encounter (Signed)
-----   Message from Loletta Specteroger David Gomez, PA-C sent at 01/17/2018  8:29 AM EDT ----- Prostate normal. Kidneys and electrolytes normal.

## 2018-01-17 NOTE — Telephone Encounter (Signed)
Clonazepam 0.25 mg does not come in any other form besides disintegrating tablet. Therefore, I have changed rx to Clonazepam 0.5 mg tablet, take half tablet up to twice per day x30 days with no refills. He can pick up the prescription.

## 2018-01-17 NOTE — Telephone Encounter (Signed)
Patient called to ask about his clonazepam. And was then informed that his labs are normal. Maryjean Mornempestt S Roberts, CMA

## 2018-01-22 ENCOUNTER — Ambulatory Visit: Payer: Self-pay | Attending: Family Medicine

## 2018-01-22 ENCOUNTER — Telehealth: Payer: Self-pay | Admitting: Physician Assistant

## 2018-01-22 MED FILL — ARIPiprazole 2 MG TABS: 2 | 30 days supply | Qty: 30 | Fill #0

## 2018-01-22 MED FILL — $Viagra 50mg tablet: 50 | 30 days supply | Qty: 10 | Fill #1

## 2018-01-22 NOTE — Telephone Encounter (Signed)
LCSWA met with patient while he was in clinic. Pt was informed of his Financial Counseling appointment scheduled for today at 3:30 PM. No other concerns noted.

## 2018-01-22 NOTE — Telephone Encounter (Signed)
Pt called to request to speak with his social worker Jenel LucksJasmine Lewis, she was unavailable.He states he will be in the clinic later this afternoon for medications at our pharmacy. Please follow up when possible

## 2018-01-22 NOTE — Congregational Nurse Program (Signed)
Congregational Nurse Program Note  Date of Encounter: 01/22/2018  Past Medical History: Past Medical History:  Diagnosis Date  . Depression   . Gout   . Neuromuscular disorder (HCC)   . Thyroid disease     Encounter Details:   [facility]en [facility] the food [facility]. No voiced concerns B P [address] Carlo Guevarra [place], [place] [facility] [phone]

## 2018-02-11 ENCOUNTER — Other Ambulatory Visit (INDEPENDENT_AMBULATORY_CARE_PROVIDER_SITE_OTHER): Payer: Self-pay | Admitting: Physician Assistant

## 2018-02-11 DIAGNOSIS — E039 Hypothyroidism, unspecified: Secondary | ICD-10-CM

## 2018-02-11 MED FILL — SYNTHROID 175 MCG TABLET: 175 | 30 days supply | Qty: 30 | Fill #0

## 2018-02-11 MED FILL — ESCITALOPRAM 20 MG TABLET: 20 | 30 days supply | Qty: 30 | Fill #4

## 2018-02-11 NOTE — Telephone Encounter (Signed)
FWD to PCP. Tempestt S Roberts, CMA  

## 2018-02-17 ENCOUNTER — Ambulatory Visit: Payer: Self-pay | Attending: Physician Assistant

## 2018-02-17 MED FILL — ARIPiprazole 2 MG TABS: 2 | 30 days supply | Qty: 30 | Fill #1

## 2018-02-17 MED FILL — $Viagra 50mg tablet: 50 | 30 days supply | Qty: 10 | Fill #2

## 2018-02-18 ENCOUNTER — Encounter (INDEPENDENT_AMBULATORY_CARE_PROVIDER_SITE_OTHER): Payer: Self-pay | Admitting: Physician Assistant

## 2018-02-18 ENCOUNTER — Ambulatory Visit (INDEPENDENT_AMBULATORY_CARE_PROVIDER_SITE_OTHER): Payer: Self-pay | Admitting: Physician Assistant

## 2018-02-18 VITALS — BP 101/63 | HR 83 | Temp 97.7°F | Ht 75.0 in | Wt 222.4 lb

## 2018-02-18 DIAGNOSIS — Z23 Encounter for immunization: Secondary | ICD-10-CM

## 2018-02-18 DIAGNOSIS — F418 Other specified anxiety disorders: Secondary | ICD-10-CM

## 2018-02-18 DIAGNOSIS — G894 Chronic pain syndrome: Secondary | ICD-10-CM

## 2018-02-18 DIAGNOSIS — K0889 Other specified disorders of teeth and supporting structures: Secondary | ICD-10-CM

## 2018-02-18 DIAGNOSIS — E039 Hypothyroidism, unspecified: Secondary | ICD-10-CM

## 2018-02-18 DIAGNOSIS — M109 Gout, unspecified: Secondary | ICD-10-CM

## 2018-02-18 DIAGNOSIS — G4709 Other insomnia: Secondary | ICD-10-CM

## 2018-02-18 MED ORDER — ACETAMINOPHEN 500 MG PO TABS: 1000.0000 mg | ORAL_TABLET | Freq: Three times a day (TID) | ORAL | 0 refills | Status: AC | PRN

## 2018-02-18 MED ORDER — CLONAZEPAM 0.25 MG PO TBDP: 0.2500 mg | ORAL_TABLET | Freq: Two times a day (BID) | ORAL | 0 refills | Status: DC

## 2018-02-18 MED ORDER — CLONAZEPAM 0.25 MG PO TBDP: 0.2500 mg | ORAL_TABLET | Freq: Every day | ORAL | 0 refills | Status: DC | PRN

## 2018-02-18 MED ORDER — CLONAZEPAM 0.5 MG PO TABS: 0.2500 mg | ORAL_TABLET | Freq: Every day | ORAL | 0 refills | Status: DC | PRN

## 2018-02-18 MED ORDER — ZOLPIDEM TARTRATE 5 MG PO TABS: 5.0000 mg | ORAL_TABLET | Freq: Every evening | ORAL | 0 refills | Status: DC | PRN

## 2018-02-18 MED ORDER — ARIPIPRAZOLE 2 MG PO TABS: 2.0000 mg | ORAL_TABLET | Freq: Every day | ORAL | 5 refills | Status: DC

## 2018-02-18 MED ORDER — DOXYCYCLINE HYCLATE 100 MG PO TABS: 100.0000 mg | ORAL_TABLET | Freq: Two times a day (BID) | ORAL | 0 refills | Status: DC

## 2018-02-18 MED ORDER — PREGABALIN 75 MG PO CAPS: 75.0000 mg | ORAL_CAPSULE | Freq: Two times a day (BID) | ORAL | 3 refills | Status: DC

## 2018-02-18 MED ORDER — ESCITALOPRAM OXALATE 20 MG PO TABS: 20.0000 mg | ORAL_TABLET | Freq: Every day | ORAL | 5 refills | Status: DC

## 2018-02-18 MED ORDER — NAPROXEN 500 MG PO TABS: 500.0000 mg | ORAL_TABLET | Freq: Two times a day (BID) | ORAL | 0 refills | Status: DC | PRN

## 2018-02-18 MED FILL — DOXYCYCLINE HYCLATE 100 MG: 100 | 10 days supply | Qty: 20 | Fill #0

## 2018-02-18 MED FILL — NAPROXEN 500 MG TABLET: 500 | 30 days supply | Qty: 60 | Fill #0

## 2018-02-18 NOTE — Patient Instructions (Signed)
Varicella-Zoster Virus Vaccine Live injection  What is this medicine?  VARICELLA VIRUS VACCINE (var uh SEL uh VAHY ruhs vak SEEN) is used to prevent infections of chickenpox.  HERPES ZOSTER VIRUS VACCINE (HUR peez ZOS ter vahy ruhs vak SEEN) is used to prevent shingles in adults 64 years old and over. This vaccine is not used to treat shingles or nerve pain from shingles.  These medicines may be used for other purposes; ask your health care provider or pharmacist if you have questions.  This medicine may be used for other purposes; ask your health care provider or pharmacist if you have questions.  COMMON BRAND NAME(S): Varivax, Zostavax  What should I tell my health care provider before I take this medicine?  They need to know if you have any of the following conditions:  -blood disorders or disease  -cancer like leukemia or lymphoma  -immune system problems or therapy  -infection with fever  -recent immune globulin therapy  -tuberculosis  -an unusual or allergic reaction to vaccines, neomycin, gelatin, other medicines, foods, dyes, or preservatives  -pregnant or trying to get pregnant  -breast-feeding  How should I use this medicine?  These vaccines are for injection under the skin. They are given by a health care professional.  A copy of Vaccine Information Statements will be given before each varicella virus vaccination. Read this sheet carefully each time. The sheet may change frequently. A Vaccine Information Statement is not given before the herpes zoster virus vaccine.  Talk to your pediatrician regarding the use of the varicella virus vaccine in children. While this drug may be prescribed for children as young as 12 months of age for selected conditions, precautions do apply. The herpes zoster virus vaccine is not approved in children.  Overdosage: If you think you have taken too much of this medicine contact a poison control center or emergency room at once.  NOTE: This medicine is only for you. Do not  share this medicine with others.  What if I miss a dose?  Keep appointments for follow-up (booster) doses of varicella virus vaccine as directed. It is important not to miss your dose. Call your doctor or health care professional if you are unable to keep an appointment.  Follow-up (booster) doses are not needed for the herpes zoster virus vaccine.  What may interact with this medicine?  Do not take these medicines with any of the following medications:  -adalimumab  -anakinra  -etanercept  -infliximab  -medicines that suppress your immune system  -medicines to treat cancer  These medicines may also interact with the following medications:  -aspirin and aspirin-like medicines (varicella virus vaccine only)  -blood transfusions (varicella virus vaccine only)  -immunoglobulins (varicella virus vaccine only)  -steroid medicines like prednisone or cortisone  This list may not describe all possible interactions. Give your health care provider a list of all the medicines, herbs, non-prescription drugs, or dietary supplements you use. Also tell them if you smoke, drink alcohol, or use illegal drugs. Some items may interact with your medicine.  What should I watch for while using this medicine?  Visit your doctor for regular check ups.  These vaccines, like all vaccines, may not fully protect everyone.  After receiving these vaccines it may be possible to pass chickenpox infection to others. For up to 6 weeks, avoid people with immune system problems, pregnant women who have not had chickenpox, newborns of women who have not had chickenpox, and all newborns born at less than 28   weeks of pregnancy. Talk to your doctor for more information.  Do not become pregnant for 3 months after taking these vaccines. Women should inform their doctor if they wish to become pregnant or think they might be pregnant. There is a potential for serious side effects to an unborn child. Talk to your health care professional or pharmacist for more  information.  What side effects may I notice from receiving this medicine?  Side effects that you should report to your doctor or health care professional as soon as possible:  -allergic reactions like skin rash, itching or hives, swelling of the face, lips, or tongue  -breathing problems  -extreme changes in behavior  -feeling faint or lightheaded, falls  -fever over 102 degrees F  -pain, tingling, numbness in the hands or feet  -redness, blistering, peeling or loosening of the skin, including inside the mouth  -seizures  -unusually weak or tired  Side effects that usually do not require medical attention (report to your doctor or health care professional if they continue or are bothersome):  -aches or pains  -chickenpox-like rash  -diarrhea  -headache  -low-grade fever under 102 degrees F  -loss of appetite  -nausea, vomiting  -redness, pain, swelling at site where injected  -sleepy  -trouble sleeping  This list may not describe all possible side effects. Call your doctor for medical advice about side effects. You may report side effects to FDA at 1-800-FDA-1088.  Where should I keep my medicine?  These drugs are given in a hospital or clinic and will not be stored at home.  NOTE: This sheet is a summary. It may not cover all possible information. If you have questions about this medicine, talk to your doctor, pharmacist, or health care provider.  © 2018 Elsevier/Gold Standard (2013-01-23 14:24:35)

## 2018-02-18 NOTE — Progress Notes (Signed)
Subjective:  Patient ID: Paul Jefferson, male    DOB: 07/08/1955  Age: 62 y.o. MRN: [id number]  CC: Shingles vaccine, medication refill, and referral to dentistry  HPI  Paul PorterJames Mark [facility]Walkeris a 62 y.o.malewith a medical history of depression, anxiety, chronic pain syndrome, insomnia, hypothyroidism, HLD, gout, mild PAD, and lower extremity neuropathy presentsfor f/u of depression with anxiety. Was prescribed Abilify, Lexapro, and Clonazepam three months ago. Says medications are working and is grateful that he is feeling better. Has begun to spend time with friends and family as opposed to isolating himself. PHQ9 4 and GAD7 4 last month. PHQ9 3 and GAD7 2 today. Says he is sleeping better with Ambien use but recent tooth pain has been disturbing his sleep. Broke a right upper molar 3-4 days ago. Was chewing meat when he felt a "pop" and pieces of tooth came off. Taken ibuprofen OTC with moderate relief of pain. He has completed his CAFA application and was approved. Has orange card and would like to be referred to a dentist. He is also ready for referral to psychiatry.     Pt request shingles vaccination today. No rash or pain. Would like to be proactive.    Outpatient Medications Prior to Visit  Medication Sig Dispense Refill  . albuterol (VENTOLIN HFA) 108 (90 Base) MCG/ACT inhaler INHALE 2 PUFFS INTO THE LUNGS EVERY 6 HOURS AS NEEDED FOR WHEEZING OR SHORTNESS OF BREATH. 54 g 3  . allopurinol (ZYLOPRIM) 300 MG tablet Take 1 tablet (300 mg total) by mouth daily. 30 tablet 6  . ARIPiprazole (ABILIFY) 2 MG tablet Take 1 tablet (2 mg total) by mouth daily. 30 tablet 5  . aspirin EC 81 MG tablet Take 1 tablet (81 mg total) by mouth daily. 30 tablet 0  . colchicine 0.6 MG tablet Take 1 tablet (0.6 mg total) by mouth daily. 90 tablet 0  . escitalopram (LEXAPRO) 20 MG tablet Take 1 tablet (20 mg total) by mouth daily. 30 tablet 5  . pregabalin (LYRICA) 75 MG capsule Take 1 capsule (75 mg total)  by mouth 2 (two) times daily. 180 capsule 3  . rosuvastatin (CRESTOR) 20 MG tablet Take 1 tablet (20 mg total) by mouth daily. 90 tablet 1  . SYNTHROID 175 MCG tablet TAKE 1 TABLET BY MOUTH DAILY BEFORE BREAKFAST. 30 tablet 0  . VIAGRA 50 MG tablet Take 50 mg by mouth as needed.  3  . zolpidem (AMBIEN) 5 MG tablet Take 1 tablet (5 mg total) by mouth at bedtime as needed for sleep. 30 tablet 0  . acetaminophen-codeine (TYLENOL #3) 300-30 MG tablet Take 1 tablet by mouth every 12 (twelve) hours as needed for moderate pain. (Patient not taking: Reported on 01/16/2018) 30 tablet 0  . clonazePAM (KLONOPIN) 0.5 MG tablet Take 0.5 tablets (0.25 mg total) by mouth 2 (two) times daily as needed for anxiety. (Patient not taking: Reported on 02/18/2018) 30 tablet 0   No facility-administered medications prior to visit.      ROS Review of Systems  Constitutional: Negative for chills, fever and malaise/fatigue.  HENT:       Dental pain  Eyes: Negative for blurred vision.  Respiratory: Negative for shortness of breath.   Cardiovascular: Negative for chest pain and palpitations.  Gastrointestinal: Negative for abdominal pain and nausea.  Genitourinary: Negative for dysuria and hematuria.  Musculoskeletal: Negative for joint pain and myalgias.  Skin: Negative for rash.  Neurological: Negative for tingling and headaches.  Psychiatric/Behavioral: Positive for  depression. The patient is nervous/anxious and has insomnia.     Objective:  BP 101/63 (BP Location: Right Arm, Patient Position: Sitting, Cuff Size: Large)   Pulse 83   Temp 97.7 F (36.5 C) (Oral)   Ht 6\' 3"  (1.905 m)   Wt 222 lb 6.4 oz (100.9 kg)   SpO2 96%   BMI 27.80 kg/m   BP/Weight 02/18/2018 01/16/2018 12/19/2017  Systolic BP 101 100 121  Diastolic BP 63 69 76  Wt. (Lbs) 222.4 226.4 230.4  BMI 27.8 28.3 28.8      Physical Exam  Constitutional: He is oriented to person, place, and time.  Well developed, well nourished, NAD,  polite  HENT:  Head: Normocephalic and atraumatic.  Multiple decayed teeth with some molars completely avulsed including the right upper molar causing his pain. Seems tooth number 2 is tender to palpation but no sign of abscess.  No facial swelling or oropharyngeal swelling.  Eyes: No scleral icterus.  Neck: Normal range of motion. Neck supple. No thyromegaly present.  Cardiovascular: Normal rate, regular rhythm and normal heart sounds.  Pulmonary/Chest: Effort normal and breath sounds normal.  Musculoskeletal: He exhibits no edema.  Lymphadenopathy:    He has no cervical adenopathy.  Neurological: He is alert and oriented to person, place, and time.  Skin: Skin is warm and dry. No rash noted. No erythema. No pallor.  Psychiatric: His behavior is normal. Thought content normal.  Seems somewhat anxious  Vitals reviewed.    Assessment & Plan:     1. Pain, dental - Begin acetaminophen (TYLENOL) 500 MG tablet; Take 2 tablets (1,000 mg total) by mouth every 8 (eight) hours as needed.  Dispense: 90 tablet; Refill: 0 - Begin naproxen (NAPROSYN) 500 MG tablet; Take 1 tablet (500 mg total) by mouth 2 (two) times daily as needed.  Dispense: 60 tablet; Refill: 0 - Begin if needed, doxycycline (VIBRA-TABS) 100 MG tablet; Take 1 tablet (100 mg total) by mouth 2 (two) times daily.  Dispense: 20 tablet; Refill: 0  2. Depression with anxiety - Refill escitalopram (LEXAPRO) 20 MG tablet; Take 1 tablet (20 mg total) by mouth daily.  Dispense: 30 tablet; Refill: 5 - Refill ARIPiprazole (ABILIFY) 2 MG tablet; Take 1 tablet (2 mg total) by mouth daily.  Dispense: 30 tablet; Refill: 5 - Decrease clonazePAM (KLONOPIN) 0.5 MG tablet; Take 0.5 tablets (0.25 mg total) by mouth daily as needed for anxiety.  Dispense: 15 tablet; Refill: 0 - Ambulatory referral to Psychiatry  3. Other insomnia - Refill zolpidem (AMBIEN) 5 MG tablet; Take 1 tablet (5 mg total) by mouth at bedtime as needed for sleep.  Dispense:  30 tablet; Refill: 0  4. Chronic pain syndrome - Refill pregabalin (LYRICA) 75 MG capsule; Take 1 capsule (75 mg total) by mouth 2 (two) times daily.  Dispense: 180 capsule; Refill: 3  5. Need for shingles vaccine - AdministeredVaricella-zoster vaccine subcutaneous  6. Hypothyroidism, unspecified type - Thyroid Panel With TSH  7. Gout, unspecified cause, unspecified chronicity, unspecified site - Uric Acid. Last level 6.7     Meds ordered this encounter  Medications  . DISCONTD: clonazePAM (KLONOPIN) 0.25 MG disintegrating tablet    Sig: Take 1 tablet (0.25 mg total) by mouth 2 (two) times daily.    Dispense:  60 tablet    Refill:  0    Order Specific Question:   Supervising Provider    Answer:   Hoy Register [[id number]]  . escitalopram (LEXAPRO) 20 MG tablet  Sig: Take 1 tablet (20 mg total) by mouth daily.    Dispense:  30 tablet    Refill:  5    Order Specific Question:   Supervising Provider    Answer:   Hoy Register [[id number]]  . ARIPiprazole (ABILIFY) 2 MG tablet    Sig: Take 1 tablet (2 mg total) by mouth daily.    Dispense:  30 tablet    Refill:  5    Order Specific Question:   Supervising Provider    Answer:   Hoy Register [[id number]]  . pregabalin (LYRICA) 75 MG capsule    Sig: Take 1 capsule (75 mg total) by mouth 2 (two) times daily.    Dispense:  180 capsule    Refill:  3    Order Specific Question:   Supervising Provider    Answer:   Hoy Register [[id number]]  . zolpidem (AMBIEN) 5 MG tablet    Sig: Take 1 tablet (5 mg total) by mouth at bedtime as needed for sleep.    Dispense:  30 tablet    Refill:  0    Order Specific Question:   Supervising Provider    Answer:   Hoy Register [[id number]]  . acetaminophen (TYLENOL) 500 MG tablet    Sig: Take 2 tablets (1,000 mg total) by mouth every 8 (eight) hours as needed.    Dispense:  90 tablet    Refill:  0    Order Specific Question:   Supervising Provider    Answer:   Hoy Register [[id number]]  . naproxen (NAPROSYN)  500 MG tablet    Sig: Take 1 tablet (500 mg total) by mouth 2 (two) times daily as needed.    Dispense:  60 tablet    Refill:  0    Order Specific Question:   Supervising Provider    Answer:   Hoy Register [[id number]]  . doxycycline (VIBRA-TABS) 100 MG tablet    Sig: Take 1 tablet (100 mg total) by mouth 2 (two) times daily.    Dispense:  20 tablet    Refill:  0    Order Specific Question:   Supervising Provider    Answer:   Hoy Register [[id number]]  . DISCONTD: clonazePAM (KLONOPIN) 0.25 MG disintegrating tablet    Sig: Take 1 tablet (0.25 mg total) by mouth daily as needed for seizure.    Dispense:  30 tablet    Refill:  0    Order Specific Question:   Supervising Provider    Answer:   Hoy Register [[id number]]  . clonazePAM (KLONOPIN) 0.5 MG tablet    Sig: Take 0.5 tablets (0.25 mg total) by mouth daily as needed for anxiety.    Dispense:  15 tablet    Refill:  0    Order Specific Question:   Supervising Provider    Answer:   Hoy Register [[id number]]    Follow-up: Return in about 4 weeks (around 03/18/2018) for Anxiety and depression.   Loletta Specter PA

## 2018-02-19 ENCOUNTER — Telehealth (INDEPENDENT_AMBULATORY_CARE_PROVIDER_SITE_OTHER): Payer: Self-pay

## 2018-02-19 LAB — THYROID PANEL WITH TSH
Free Thyroxine Index: 2.3 (ref 1.2–4.9)
T3 UPTAKE RATIO: 27 % (ref 24–39)
T4 TOTAL: 8.4 ug/dL (ref 4.5–12.0)
TSH: 9.06 u[IU]/mL — ABNORMAL HIGH (ref 0.450–4.500)

## 2018-02-19 LAB — URIC ACID: URIC ACID: 5.5 mg/dL (ref 3.7–8.6)

## 2018-02-19 NOTE — Telephone Encounter (Signed)
-----   Message from Loletta Specteroger David Gomez, PA-C sent at 02/19/2018  9:30 AM EDT ----- TSH is beginning to elevate. Please ask if he is taking his Synthroid medication or if he has run out. Uric acid is well controlled.

## 2018-02-19 NOTE — Telephone Encounter (Signed)
Error message states " we are sorry your call can not be completed at this time, please hang up and try your call again later. Thank You. Will attempt to call patient once more before mailing results. Tempestt Budd PalmerS Roberts

## 2018-02-20 ENCOUNTER — Telehealth (INDEPENDENT_AMBULATORY_CARE_PROVIDER_SITE_OTHER): Payer: Self-pay

## 2018-02-20 ENCOUNTER — Encounter (INDEPENDENT_AMBULATORY_CARE_PROVIDER_SITE_OTHER): Payer: Self-pay

## 2018-02-20 NOTE — Telephone Encounter (Signed)
-----   Message from Loletta Specteroger David Gomez, PA-C sent at 02/19/2018  9:30 AM EDT ----- TSH is beginning to elevate. Please ask if he is taking his Synthroid medication or if he has run out. Uric acid is well controlled.

## 2018-02-20 NOTE — Telephone Encounter (Signed)
Called patient and received same error message stating " we're sorry your call can not be completed at this time, please hang up and try your call again later, Thank You." will mail results to patient as well as unable to reach letter asking patient to update his contact information. Maryjean Mornempestt S Roberts, CMA

## 2018-02-26 ENCOUNTER — Telehealth: Payer: Self-pay

## 2018-02-26 NOTE — Telephone Encounter (Signed)
As per Maria Ramirez Perez, [employer], they are still working with the patient.  

## 2018-02-28 ENCOUNTER — Telehealth: Payer: Self-pay | Admitting: Physician Assistant

## 2018-02-28 NOTE — Telephone Encounter (Signed)
I try to called Pt to informed him that is not qualified for the [place] card since his address show in [facility] since the OC is only for [employer]

## 2018-03-06 ENCOUNTER — Other Ambulatory Visit (INDEPENDENT_AMBULATORY_CARE_PROVIDER_SITE_OTHER): Payer: Self-pay | Admitting: Physician Assistant

## 2018-03-06 DIAGNOSIS — K0889 Other specified disorders of teeth and supporting structures: Secondary | ICD-10-CM

## 2018-03-06 DIAGNOSIS — F418 Other specified anxiety disorders: Secondary | ICD-10-CM

## 2018-03-11 ENCOUNTER — Other Ambulatory Visit (INDEPENDENT_AMBULATORY_CARE_PROVIDER_SITE_OTHER): Payer: Self-pay | Admitting: Physician Assistant

## 2018-03-11 DIAGNOSIS — E039 Hypothyroidism, unspecified: Secondary | ICD-10-CM

## 2018-03-11 MED FILL — ESCITALOPRAM 20 MG TABLET: 20 | 30 days supply | Qty: 30 | Fill #5

## 2018-03-11 NOTE — Telephone Encounter (Signed)
FWD to PCP. Paul Jefferson, CMA  

## 2018-03-12 MED FILL — SYNTHROID 175 MCG TABLET: 175 | 30 days supply | Qty: 30 | Fill #0

## 2018-03-18 ENCOUNTER — Other Ambulatory Visit: Payer: Self-pay

## 2018-03-18 ENCOUNTER — Other Ambulatory Visit: Payer: Self-pay | Admitting: Family Medicine

## 2018-03-18 ENCOUNTER — Ambulatory Visit (INDEPENDENT_AMBULATORY_CARE_PROVIDER_SITE_OTHER): Payer: Self-pay | Admitting: Physician Assistant

## 2018-03-18 ENCOUNTER — Encounter (INDEPENDENT_AMBULATORY_CARE_PROVIDER_SITE_OTHER): Payer: Self-pay | Admitting: Physician Assistant

## 2018-03-18 VITALS — BP 109/70 | HR 85 | Temp 98.0°F | Ht 75.0 in | Wt 222.6 lb

## 2018-03-18 DIAGNOSIS — M109 Gout, unspecified: Secondary | ICD-10-CM

## 2018-03-18 DIAGNOSIS — G894 Chronic pain syndrome: Secondary | ICD-10-CM

## 2018-03-18 DIAGNOSIS — E039 Hypothyroidism, unspecified: Secondary | ICD-10-CM

## 2018-03-18 DIAGNOSIS — G4709 Other insomnia: Secondary | ICD-10-CM

## 2018-03-18 DIAGNOSIS — F418 Other specified anxiety disorders: Secondary | ICD-10-CM

## 2018-03-18 MED ORDER — PREGABALIN 75 MG PO CAPS: 75.0000 mg | ORAL_CAPSULE | Freq: Two times a day (BID) | ORAL | 5 refills | Status: DC

## 2018-03-18 MED ORDER — ZOLPIDEM TARTRATE 5 MG PO TABS: 5.0000 mg | ORAL_TABLET | Freq: Every evening | ORAL | 2 refills | Status: DC | PRN

## 2018-03-18 MED ORDER — ALLOPURINOL 300 MG PO TABS: 300.0000 mg | ORAL_TABLET | Freq: Every day | ORAL | 6 refills | Status: DC

## 2018-03-18 MED ORDER — PREGABALIN 75 MG PO CAPS: 75.0000 mg | ORAL_CAPSULE | Freq: Two times a day (BID) | ORAL | 1 refills | Status: AC

## 2018-03-18 NOTE — Patient Instructions (Signed)

## 2018-03-18 NOTE — Progress Notes (Signed)
Subjective:  Patient ID: Paul Jefferson, male    DOB: 03/14/1956  Age: 62 y.o. MRN: [id number]  CC: f/u   HPI Paul Jefferson [facility] a 62 y.o.malewith a medical history of depression, anxiety, chronic pain syndrome, insomnia, hypothyroidism, HLD, gout, mild PAD, and lower extremity neuropathy presentsto f/u on depression, anxiety, chronic pain syndrome, and insomnia. Pt is feeling moderately better with current treatment regimen. However, he did not receive Lyrica for his chronic pain syndrome. Has a form that states prescriber has to sign enrollment form for the [employer] Patient Assistance Program. Pt was referred to psychiatry but has not received a call from the psychiatry department yet. Taking all medications as directed. Does not endorse any other symptoms or complaints.     Outpatient Medications Prior to Visit  Medication Sig Dispense Refill  . acetaminophen (TYLENOL) 500 MG tablet Take 2 tablets (1,000 mg total) by mouth every 8 (eight) hours as needed. 90 tablet 0  . albuterol (VENTOLIN HFA) 108 (90 Base) MCG/ACT inhaler INHALE 2 PUFFS INTO THE LUNGS EVERY 6 HOURS AS NEEDED FOR WHEEZING OR SHORTNESS OF BREATH. 54 g 3  . allopurinol (ZYLOPRIM) 300 MG tablet Take 1 tablet (300 mg total) by mouth daily. 30 tablet 6  . ARIPiprazole (ABILIFY) 2 MG tablet Take 1 tablet (2 mg total) by mouth daily. 30 tablet 5  . aspirin EC 81 MG tablet Take 1 tablet (81 mg total) by mouth daily. 30 tablet 0  . clonazePAM (KLONOPIN) 0.5 MG tablet Take 0.5 tablets (0.25 mg total) by mouth daily as needed for anxiety. 15 tablet 0  . escitalopram (LEXAPRO) 20 MG tablet Take 1 tablet (20 mg total) by mouth daily. 30 tablet 5  . naproxen (NAPROSYN) 500 MG tablet Take 1 tablet (500 mg total) by mouth 2 (two) times daily as needed. 60 tablet 0  . pregabalin (LYRICA) 75 MG capsule Take 1 capsule (75 mg total) by mouth 2 (two) times daily. 180 capsule 3  . rosuvastatin (CRESTOR) 20 MG tablet Take 1 tablet (20 mg  total) by mouth daily. 90 tablet 1  . SYNTHROID 175 MCG tablet TAKE 1 TABLET BY MOUTH DAILY BEFORE BREAKFAST. 30 tablet 0  . VIAGRA 50 MG tablet Take 50 mg by mouth as needed.  3  . zolpidem (AMBIEN) 5 MG tablet Take 1 tablet (5 mg total) by mouth at bedtime as needed for sleep. 30 tablet 0  . colchicine 0.6 MG tablet Take 1 tablet (0.6 mg total) by mouth daily. 90 tablet 0  . doxycycline (VIBRA-TABS) 100 MG tablet Take 1 tablet (100 mg total) by mouth 2 (two) times daily. 20 tablet 0   No facility-administered medications prior to visit.      ROS Review of Systems  Constitutional: Negative for chills, fever and malaise/fatigue.  Eyes: Negative for blurred vision.  Respiratory: Negative for shortness of breath.   Cardiovascular: Negative for chest pain and palpitations.  Gastrointestinal: Negative for abdominal pain and nausea.  Genitourinary: Negative for dysuria and hematuria.  Musculoskeletal: Negative for joint pain and myalgias.       Generalized pain  Skin: Negative for rash.  Neurological: Negative for tingling and headaches.  Psychiatric/Behavioral: Positive for depression. The patient is nervous/anxious and has insomnia.     Objective:  BP 109/70 (BP Location: Left Arm, Patient Position: Sitting, Cuff Size: Normal)   Pulse 85   Temp 98 F (36.7 C) (Oral)   Ht 6\' 3"  (1.905 m)   Wt 222 lb  9.6 oz (101 kg)   SpO2 96%   BMI 27.82 kg/m   BP/Weight 03/18/2018 02/18/2018 01/16/2018  Systolic BP 109 101 100  Diastolic BP 70 63 69  Wt. (Lbs) 222.6 222.4 226.4  BMI 27.82 27.8 28.3      Physical Exam  Constitutional: He is oriented to person, place, and time.  Well developed, well nourished, NAD, polite  HENT:  Head: Normocephalic and atraumatic.  Eyes: No scleral icterus.  Neck: Normal range of motion. Neck supple. No thyromegaly present.  Cardiovascular: Normal rate, regular rhythm and normal heart sounds.  Pulmonary/Chest: Effort normal and breath sounds normal.   Abdominal: Soft. Bowel sounds are normal. There is no tenderness.  Musculoskeletal: He exhibits no edema.  Neurological: He is alert and oriented to person, place, and time.  Skin: Skin is warm and dry. No rash noted. No erythema. No pallor.  Psychiatric: His behavior is normal. Thought content normal.  Body language portrays mild anxiety also some fatigue. Thoughts linear. Judgment fair, insight fair   Vitals reviewed.    Assessment & Plan:    1. Depression with anxiety - Fair control with Escitalopram and Klonopin. Klonopin has been reduced over time and patient will now cease Klonopin.   2. Chronic pain syndrome - Begin (2nd Rx) pregabalin (LYRICA) 75 MG capsule; Take 1 capsule (75 mg total) by mouth 2 (two) times daily.  Dispense: 60 capsule; Refill: 5. Seems pt will need to have [employer] patient assistant enrollment form completed to receive Lyrica.   3. Hypothyroidism, unspecified type - Thyroid Panel With TSH  4. Other insomnia - Refill zolpidem (AMBIEN) 5 MG tablet; Take 1 tablet (5 mg total) by mouth at bedtime as needed for sleep.  Dispense: 30 tablet; Refill: 2  5. Gout of ankle, unspecified cause, unspecified chronicity, unspecified laterality - Last uric acid below 6.0 while on allopurinol 300 mg.  - Refill allopurinol (ZYLOPRIM) 300 MG tablet; Take 1 tablet (300 mg total) by mouth daily.  Dispense: 30 tablet; Refill: 6   Meds ordered this encounter  Medications  . pregabalin (LYRICA) 75 MG capsule    Sig: Take 1 capsule (75 mg total) by mouth 2 (two) times daily.    Dispense:  60 capsule    Refill:  5    Order Specific Question:   Supervising Provider    Answer:   Hoy Register [[id number]]  . zolpidem (AMBIEN) 5 MG tablet    Sig: Take 1 tablet (5 mg total) by mouth at bedtime as needed for sleep.    Dispense:  30 tablet    Refill:  2    Order Specific Question:   Supervising Provider    Answer:   Hoy Register [[id number]]  . allopurinol (ZYLOPRIM) 300 MG tablet     Sig: Take 1 tablet (300 mg total) by mouth daily.    Dispense:  30 tablet    Refill:  6    Order Specific Question:   Supervising Provider    Answer:   Hoy Register [[id number]]    Follow-up: Return in about 8 weeks (around 05/13/2018) for depression.   Loletta Specter PA

## 2018-03-19 ENCOUNTER — Telehealth (INDEPENDENT_AMBULATORY_CARE_PROVIDER_SITE_OTHER): Payer: Self-pay

## 2018-03-19 LAB — THYROID PANEL WITH TSH
Free Thyroxine Index: 2.5 (ref 1.2–4.9)
T3 Uptake Ratio: 31 % (ref 24–39)
T4 TOTAL: 8.1 ug/dL (ref 4.5–12.0)
TSH: 0.672 u[IU]/mL (ref 0.450–4.500)

## 2018-03-19 NOTE — Telephone Encounter (Signed)
Called patient but received error message that call can not be completed at this time, please hang up and try your call again later. Will try calling patient once more before mailing results. Maryjean Morn, CMA

## 2018-03-19 NOTE — Telephone Encounter (Signed)
-----   Message from Loletta Specter, PA-C sent at 03/19/2018 12:42 PM EDT ----- Normal thyroid level.

## 2018-03-20 ENCOUNTER — Encounter (INDEPENDENT_AMBULATORY_CARE_PROVIDER_SITE_OTHER): Payer: Self-pay

## 2018-03-20 ENCOUNTER — Telehealth (INDEPENDENT_AMBULATORY_CARE_PROVIDER_SITE_OTHER): Payer: Self-pay

## 2018-03-20 NOTE — Telephone Encounter (Signed)
-----   Message from Roger David Gomez, PA-C sent at 03/19/2018 12:42 PM EDT ----- Normal thyroid level. 

## 2018-03-20 NOTE — Telephone Encounter (Signed)
Called patient and received the following message we're sorry your call can not be completed at this time. Please hang up and your try your call again later. Results mailed. Maryjean Morn, CMA

## 2018-04-07 ENCOUNTER — Other Ambulatory Visit (INDEPENDENT_AMBULATORY_CARE_PROVIDER_SITE_OTHER): Payer: Self-pay | Admitting: Physician Assistant

## 2018-04-07 ENCOUNTER — Ambulatory Visit: Payer: Self-pay | Attending: Family Medicine | Admitting: Licensed Clinical Social Worker

## 2018-04-07 DIAGNOSIS — F331 Major depressive disorder, recurrent, moderate: Secondary | ICD-10-CM

## 2018-04-07 DIAGNOSIS — F1010 Alcohol abuse, uncomplicated: Secondary | ICD-10-CM

## 2018-04-07 DIAGNOSIS — E039 Hypothyroidism, unspecified: Secondary | ICD-10-CM

## 2018-04-07 MED ORDER — LEVOTHYROXINE SODIUM 175 MCG PO TABS: ORAL_TABLET | ORAL | 5 refills | Status: AC

## 2018-04-07 MED FILL — ARIPiprazole 2 MG TABS: 2 | 30 days supply | Qty: 30 | Fill #2

## 2018-04-07 MED FILL — ESCITALOPRAM 20 MG TABLET: 20 | 30 days supply | Qty: 30 | Fill #0

## 2018-04-07 NOTE — Telephone Encounter (Signed)
FWD to PCP. Tempestt S Roberts, CMA  

## 2018-04-07 NOTE — BH Specialist Note (Signed)
Integrated Behavioral Health Initial Visit  MRN: [id number] Name: Jaycion Treml  Number of Integrated Behavioral Health Clinician visits:: 2/6 Session Start time: 1:55 PM  Session End time: 2:30 PM Total time: 35 minutes  Type of Service: Integrated Behavioral Health- Individual/Family Interpretor:No. Interpretor Name and Language: N/A   SUBJECTIVE: Sincere Liuzzi is a 62 y.o. male accompanied by self Patient was referred by Conley Simmonds for depression, anxiety, and substance use resources. Patient reports the following symptoms/concerns: Pt shared that he has lost his house to foreclosure and has been abusing alcohol to cope with stressors. He wants to complete detox and substance treatment (inpatient) Duration of problem: months; Severity of problem: severe  OBJECTIVE: Mood: Anxious and Depressed and Affect: Depressed Risk of harm to self or others: No plan to harm self or others  LIFE CONTEXT: Family and Social: Pt resides alone. He has siblings that reside out of state and aren't supportive School/Work: Pt does not receive income Self-Care: Pt drinks alcohol daily to cope with stressors Life Changes: Pt is at risk for homelessness, experiences financial strain, and has been abusing substances  GOALS ADDRESSED: Patient will: 1. Reduce symptoms of: anxiety, depression and stress 2. Increase knowledge and/or ability of: coping skills and self-management skills  3. Demonstrate ability to: Decrease self-medicating behaviors  INTERVENTIONS: Interventions utilized: Solution-Focused Strategies and Supportive Counseling  Standardized Assessments completed: Not Needed  ASSESSMENT: Patient currently experiencing depression and anxiety triggered by the foreclosure of his home, financial strain, and substance use. He receives limited support. No report of SI/HI.    Patient may benefit from inpatient substance use treatment. LCSWA educated pt on the correlation between health and  how substance use can negatively impact it. Pt is willing to participate in detox and substance use treatment through [facility]. Pt was provided medical records and refills on medication to assist with admission to treatment facility. A list if local [place] houses were provided.   PLAN: 1. Follow up with behavioral health clinician on : Pt was encouraged tocontact LCSWA if symptoms worsen or fail to improveto schedule [facility] appointments at [facility]. 2. Behavioral recommendations: LCSWA recommends that pt apply healthy coping skills discussed, comply with medication management, follow up with [facility], and utilize provided resources.  3. Referral(s): Substance Abuse Program and Community Resources:  Housing 4. "From scale of 1-10, how likely are you to follow plan?": 10/10  Bridgett Larsson, LCSW 04/11/18 2:30 PM

## 2018-04-08 MED FILL — LEVOTHYROXINE 175 MCG TAB: 175 | 30 days supply | Qty: 30 | Fill #0

## 2018-04-23 ENCOUNTER — Telehealth: Payer: Self-pay

## 2018-04-23 NOTE — Telephone Encounter (Signed)
As per Paul Jefferson, [employer], the case is still active 

## 2018-05-06 MED FILL — ARIPiprazole 2 MG TABS: 2 | 30 days supply | Qty: 30 | Fill #3

## 2018-05-06 MED FILL — ESCITALOPRAM 20 MG TABLET: 20 | 30 days supply | Qty: 30 | Fill #1

## 2018-05-12 MED FILL — SYNTHROID 175 MCG TABLET: 175 | 30 days supply | Qty: 30 | Fill #1

## 2018-05-13 ENCOUNTER — Other Ambulatory Visit: Payer: Self-pay

## 2018-05-13 ENCOUNTER — Ambulatory Visit (INDEPENDENT_AMBULATORY_CARE_PROVIDER_SITE_OTHER): Payer: Self-pay | Admitting: Physician Assistant

## 2018-05-13 ENCOUNTER — Encounter (INDEPENDENT_AMBULATORY_CARE_PROVIDER_SITE_OTHER): Payer: Self-pay | Admitting: Physician Assistant

## 2018-05-13 VITALS — BP 99/61 | HR 83 | Temp 97.7°F | Ht 75.0 in | Wt 229.0 lb

## 2018-05-13 DIAGNOSIS — F418 Other specified anxiety disorders: Secondary | ICD-10-CM

## 2018-05-13 DIAGNOSIS — J449 Chronic obstructive pulmonary disease, unspecified: Secondary | ICD-10-CM

## 2018-05-13 DIAGNOSIS — R059 Cough, unspecified: Secondary | ICD-10-CM

## 2018-05-13 DIAGNOSIS — R5381 Other malaise: Secondary | ICD-10-CM

## 2018-05-13 DIAGNOSIS — R05 Cough: Secondary | ICD-10-CM

## 2018-05-13 DIAGNOSIS — J019 Acute sinusitis, unspecified: Secondary | ICD-10-CM

## 2018-05-13 DIAGNOSIS — G4709 Other insomnia: Secondary | ICD-10-CM

## 2018-05-13 MED ORDER — BENZONATATE 200 MG PO CAPS: 200.0000 mg | ORAL_CAPSULE | Freq: Two times a day (BID) | ORAL | 0 refills | Status: DC | PRN

## 2018-05-13 MED ORDER — PHENYLEPHRINE-DM-GG-APAP 5-10-200-325 MG/10ML PO LIQD: 20.0000 mL | ORAL | 0 refills | Status: AC

## 2018-05-13 MED ORDER — ARIPIPRAZOLE 5 MG PO TABS: 5.0000 mg | ORAL_TABLET | Freq: Every day | ORAL | 2 refills | Status: DC

## 2018-05-13 MED ORDER — ALBUTEROL SULFATE HFA 108 (90 BASE) MCG/ACT IN AERS: INHALATION_SPRAY | RESPIRATORY_TRACT | 3 refills | Status: AC

## 2018-05-13 MED ORDER — AZITHROMYCIN 250 MG PO TABS: ORAL_TABLET | ORAL | 0 refills | Status: DC

## 2018-05-13 MED ORDER — ZOLPIDEM TARTRATE 5 MG PO TABS: 5.0000 mg | ORAL_TABLET | Freq: Every evening | ORAL | 0 refills | Status: DC | PRN

## 2018-05-13 MED FILL — BENZONATATE 100 MG CAP: 100 | 10 days supply | Qty: 40 | Fill #0

## 2018-05-13 MED FILL — ALBUTEROL SULFATE HFA 108 (: 108 (90 BAS | 25 days supply | Qty: 18 | Fill #0

## 2018-05-13 MED FILL — AZITHROMYCIN 250 MG TABLET: 250 | 5 days supply | Qty: 6 | Fill #0

## 2018-05-13 NOTE — Progress Notes (Signed)
Subjective:  Patient ID: Paul Jefferson, male    DOB: May 04, 1956  Age: 62 y.o. MRN: [id number]  CC: f/u depression  HPI Paul Jefferson [facility] a 62 y.o.malewith a medical history of depression, anxiety, chronic pain syndrome, insomnia, hypothyroidism, HLD, gout, mild PAD, and lower extremity neuropathy presentsto f/u on depression and insomnia. Prescribed Lexapro 20 mg and Abilify 2 mg. Believes he may need a stronger dose of an antidepressant. However, overall, he feels much better in regards to depression. Last PHQ9 4. PHQ9 7 and GAD7 5 in clinic today.      In regards to chronic pain syndrome, pt was prescribed Lyrica 75 mg. Pt feels much relief of leg pain with Lyrica. He is receiving Lyrica 75 mg through [employer] patient assistance program. Thinks he is approved for six months in total. Pain is now rated at 2-3/10.      Lastly, pt has been ill with malaise, fatigue, congestion, sinus pressure, cough, and sore throat for the past two weeks.           Outpatient Medications Prior to Visit  Medication Sig Dispense Refill  . acetaminophen (TYLENOL) 500 MG tablet Take 2 tablets (1,000 mg total) by mouth every 8 (eight) hours as needed. 90 tablet 0  . albuterol (VENTOLIN HFA) 108 (90 Base) MCG/ACT inhaler INHALE 2 PUFFS INTO THE LUNGS EVERY 6 HOURS AS NEEDED FOR WHEEZING OR SHORTNESS OF BREATH. 54 g 3  . allopurinol (ZYLOPRIM) 300 MG tablet Take 1 tablet (300 mg total) by mouth daily. 30 tablet 6  . ARIPiprazole (ABILIFY) 2 MG tablet Take 1 tablet (2 mg total) by mouth daily. 30 tablet 5  . aspirin EC 81 MG tablet Take 1 tablet (81 mg total) by mouth daily. 30 tablet 0  . colchicine 0.6 MG tablet Take 1 tablet (0.6 mg total) by mouth daily. 90 tablet 0  . escitalopram (LEXAPRO) 20 MG tablet Take 1 tablet (20 mg total) by mouth daily. 30 tablet 5  . levothyroxine (SYNTHROID) 175 MCG tablet TAKE 1 TABLET BY MOUTH DAILY BEFORE BREAKFAST. 30 tablet 5  . pregabalin (LYRICA) 75 MG  capsule Take 1 capsule (75 mg total) by mouth 2 (two) times daily. 180 capsule 1  . rosuvastatin (CRESTOR) 20 MG tablet Take 1 tablet (20 mg total) by mouth daily. 90 tablet 1  . VIAGRA 50 MG tablet Take 50 mg by mouth as needed.  3  . zolpidem (AMBIEN) 5 MG tablet Take 1 tablet (5 mg total) by mouth at bedtime as needed for sleep. 30 tablet 2  . naproxen (NAPROSYN) 500 MG tablet Take 1 tablet (500 mg total) by mouth 2 (two) times daily as needed. 60 tablet 0   No facility-administered medications prior to visit.      ROS Review of Systems  Constitutional: Positive for malaise/fatigue. Negative for chills and fever.  HENT: Positive for congestion, sinus pain and sore throat.   Eyes: Negative for blurred vision.  Respiratory: Positive for cough. Negative for shortness of breath.   Cardiovascular: Negative for chest pain and palpitations.  Gastrointestinal: Negative for abdominal pain and nausea.  Genitourinary: Negative for dysuria and hematuria.  Musculoskeletal: Negative for joint pain and myalgias.  Skin: Negative for rash.  Neurological: Negative for tingling and headaches.  Psychiatric/Behavioral: Positive for depression. The patient is nervous/anxious.     Objective:  BP 99/61 (BP Location: Left Arm, Patient Position: Sitting, Cuff Size: Normal)   Pulse 83   Temp 97.7 F (36.5  C) (Oral)   Ht 6\' 3"  (1.905 m)   Wt 229 lb (103.9 kg)   SpO2 95%   BMI 28.62 kg/m   BP/Weight 05/13/2018 03/18/2018 02/18/2018  Systolic BP 99 109 101  Diastolic BP 61 70 63  Wt. (Lbs) 229 222.6 222.4  BMI 28.62 27.82 27.8      Physical Exam  Constitutional: He is oriented to person, place, and time.  Well developed, well nourished, NAD, polite, mildly ill appearing.   HENT:  Head: Normocephalic and atraumatic.  Turbinates hypertrophic. TMs normal. Mild sinus tenderness to palpation  Eyes: No scleral icterus.  Neck: Normal range of motion. Neck supple. No thyromegaly present.   Cardiovascular: Normal rate, regular rhythm and normal heart sounds.  Pulmonary/Chest: Effort normal and breath sounds normal.  Musculoskeletal: He exhibits no edema.  Lymphadenopathy:    He has cervical adenopathy.  Neurological: He is alert and oriented to person, place, and time.  Skin: Skin is warm and dry. No rash noted. No erythema. No pallor.  Psychiatric: He has a normal mood and affect. His behavior is normal. Thought content normal.  Vitals reviewed.    Assessment & Plan:   1. Depression with anxiety - Increase ARIPiprazole (ABILIFY) 5 MG tablet; Take 1 tablet (5 mg total) by mouth daily.  Dispense: 30 tablet; Refill: 2 - Continue Lexapro 20 mg - Keep upcoming psychiatry appointment.   2. Acute sinusitis, recurrence not specified, unspecified location - azithromycin (ZITHROMAX) 250 MG tablet; Take two tablets on day one. Then take one tablet daily thereafter.  Dispense: 6 tablet; Refill: 0 - benzonatate (TESSALON) 200 MG capsule; Take 1 capsule (200 mg total) by mouth 2 (two) times daily as needed for cough.  Dispense: 20 capsule; Refill: 0 - Phenylephrine-DM-GG-APAP (MUCINEX FAST-MAX COLD FLU) 5-10-200-325 MG/10ML LIQD; Take 20 mLs by mouth every 4 (four) hours for 5 days.  Dispense: 1 Bottle; Refill: 0  3. Cough - azithromycin (ZITHROMAX) 250 MG tablet; Take two tablets on day one. Then take one tablet daily thereafter.  Dispense: 6 tablet; Refill: 0 - benzonatate (TESSALON) 200 MG capsule; Take 1 capsule (200 mg total) by mouth 2 (two) times daily as needed for cough.  Dispense: 20 capsule; Refill: 0 - Phenylephrine-DM-GG-APAP (MUCINEX FAST-MAX COLD FLU) 5-10-200-325 MG/10ML LIQD; Take 20 mLs by mouth every 4 (four) hours for 5 days.  Dispense: 1 Bottle; Refill: 0  4. Malaise - azithromycin (ZITHROMAX) 250 MG tablet; Take two tablets on day one. Then take one tablet daily thereafter.  Dispense: 6 tablet; Refill: 0 - benzonatate (TESSALON) 200 MG capsule; Take 1 capsule  (200 mg total) by mouth 2 (two) times daily as needed for cough.  Dispense: 20 capsule; Refill: 0 - Phenylephrine-DM-GG-APAP (MUCINEX FAST-MAX COLD FLU) 5-10-200-325 MG/10ML LIQD; Take 20 mLs by mouth every 4 (four) hours for 5 days.  Dispense: 1 Bottle; Refill: 0   Meds ordered this encounter  Medications  . ARIPiprazole (ABILIFY) 5 MG tablet    Sig: Take 1 tablet (5 mg total) by mouth daily.    Dispense:  30 tablet    Refill:  2    Order Specific Question:   Supervising Provider    Answer:   Hoy RegisterNEWLIN, ENOBONG [[id number]]  . azithromycin (ZITHROMAX) 250 MG tablet    Sig: Take two tablets on day one. Then take one tablet daily thereafter.    Dispense:  6 tablet    Refill:  0    Order Specific Question:   Supervising Provider  Answer:   Hoy Register [[id number]]  . benzonatate (TESSALON) 200 MG capsule    Sig: Take 1 capsule (200 mg total) by mouth 2 (two) times daily as needed for cough.    Dispense:  20 capsule    Refill:  0    Order Specific Question:   Supervising Provider    Answer:   Hoy Register [[id number]]  . Phenylephrine-DM-GG-APAP (MUCINEX FAST-MAX COLD FLU) 5-10-200-325 MG/10ML LIQD    Sig: Take 20 mLs by mouth every 4 (four) hours for 5 days.    Dispense:  1 Bottle    Refill:  0    Order Specific Question:   Supervising Provider    Answer:   Hoy Register [[id number]]    Follow-up: Return if symptoms worsen or fail to improve, for Keep psychiatry appointment.   Loletta Specter PA

## 2018-05-13 NOTE — Patient Instructions (Signed)

## 2018-05-19 MED FILL — ARIPiprazole 5 MG TABS: 5 | 30 days supply | Qty: 30 | Fill #0

## 2018-05-26 MED FILL — ESCITALOPRAM 20 MG TABLET: 20 | 30 days supply | Qty: 30 | Fill #2

## 2018-05-26 MED FILL — SYNTHROID 175 MCG TABLET: 175 | 30 days supply | Qty: 30 | Fill #2

## 2018-05-30 ENCOUNTER — Other Ambulatory Visit: Payer: Self-pay | Admitting: Physician Assistant

## 2018-05-30 NOTE — Telephone Encounter (Signed)
FWD to PCP. Skippy Marhefka S Alyrica Thurow, CMA  

## 2018-05-31 ENCOUNTER — Telehealth (HOSPITAL_COMMUNITY): Payer: Self-pay | Admitting: Emergency Medicine

## 2018-05-31 ENCOUNTER — Encounter (HOSPITAL_COMMUNITY): Payer: Self-pay

## 2018-05-31 ENCOUNTER — Ambulatory Visit (HOSPITAL_COMMUNITY)
Admission: EM | Admit: 2018-05-31 | Discharge: 2018-05-31 | Disposition: A | Discharge status: Home / Self Care | Payer: Self-pay | Attending: Physician Assistant | Admitting: Physician Assistant

## 2018-05-31 DIAGNOSIS — R5381 Other malaise: Secondary | ICD-10-CM | POA: Insufficient documentation

## 2018-05-31 DIAGNOSIS — J019 Acute sinusitis, unspecified: Secondary | ICD-10-CM | POA: Insufficient documentation

## 2018-05-31 DIAGNOSIS — J22 Unspecified acute lower respiratory infection: Secondary | ICD-10-CM | POA: Insufficient documentation

## 2018-05-31 DIAGNOSIS — R059 Cough, unspecified: Secondary | ICD-10-CM

## 2018-05-31 DIAGNOSIS — R05 Cough: Secondary | ICD-10-CM | POA: Insufficient documentation

## 2018-05-31 MED ORDER — HYDROCODONE-HOMATROPINE 5-1.5 MG/5ML PO SYRP: 2.5000 mL | ORAL_SOLUTION | Freq: Every day | ORAL | 0 refills | Status: AC

## 2018-05-31 MED ORDER — AZITHROMYCIN 250 MG PO TABS: ORAL_TABLET | ORAL | 0 refills | Status: DC

## 2018-05-31 NOTE — ED Provider Notes (Signed)
05/31/2018 10:32 AM   DOB: 07-21-1955 / MRN: [id number]  SUBJECTIVE:  Lucienne CapersJames Mark Jefferson is a 62 y.o. male presenting for cough starting 4 days ago.  Patient is chronic smoker.  Denies fever, chills, nausea.  Cough is productive of nonbloody sputum.  Denies chest pain, shortness of breath, leg swelling, leg pain.  He is allergic to mobic [meloxicam] and penicillins.   He  has a past medical history of Depression, Gout, Neuromuscular disorder (HCC), and Thyroid disease.    He  reports that he has been smoking cigarettes. He has been smoking about 0.50 packs per day. He has never used smokeless tobacco. He reports that he does not drink alcohol or use drugs. He  reports never being sexually active. The patient  has a past surgical history that includes Cholecystectomy.  His family history is not on file.  ROS per HPI  OBJECTIVE:  BP (!) 157/70 (BP Location: Left Arm)   Pulse 76   Temp 97.8 F (36.6 C) (Oral)   Resp 16   SpO2 99%   Wt Readings from Last 3 Encounters:  05/13/18 229 lb (103.9 kg)  03/18/18 222 lb 9.6 oz (101 kg)  02/18/18 222 lb 6.4 oz (100.9 kg)   Temp Readings from Last 3 Encounters:  05/31/18 97.8 F (36.6 C) (Oral)  05/13/18 97.7 F (36.5 C) (Oral)  03/18/18 98 F (36.7 C) (Oral)   BP Readings from Last 3 Encounters:  05/31/18 (!) 157/70  05/13/18 99/61  03/18/18 109/70   Pulse Readings from Last 3 Encounters:  05/31/18 76  05/13/18 83  03/18/18 85    Physical Exam Vitals signs and nursing note reviewed.  Constitutional:      Appearance: He is well-developed. He is not ill-appearing or diaphoretic.  Eyes:     Conjunctiva/sclera: Conjunctivae normal.     Pupils: Pupils are equal, round, and reactive to light.  Cardiovascular:     Rate and Rhythm: Normal rate.  Pulmonary:     Effort: Pulmonary effort is normal.     Breath sounds: Rhonchi present.  Abdominal:     General: There is no distension.  Musculoskeletal: Normal range of motion.   Skin:    General: Skin is warm and dry.  Neurological:     Mental Status: He is alert and oriented to person, place, and time.     Cranial Nerves: No cranial nerve deficit.     Coordination: Coordination normal.     No results found for this or any previous visit (from the past 72 hour(s)).  No results found.  ASSESSMENT AND PLAN:   LRTI (lower respiratory tract infection): Patient here for cough x4 days.  Vital signs stable and patient appears nontoxic.  Lung exam with rhonchi.  Will treat with azithromycin to cover for atypical vitamin with some cough syrup for symptomatic relief.  Advised Chest pain or shortness of breath please go to the hospital.  Acute sinusitis, recurrence not specified, unspecified location - Plan: azithromycin (ZITHROMAX) 250 MG tablet  Cough - Plan: azithromycin (ZITHROMAX) 250 MG tablet  Malaise - Plan: azithromycin (ZITHROMAX) 250 MG tablet  Discharge Instructions   None        The patient is advised to call or return to clinic if he does not see an improvement in symptoms, or to seek the care of the closest emergency department if he worsens with the above plan.   Deliah BostonMichael Brant Peets, MHS, PA-C 05/31/2018 10:32 AM   Ofilia Neaslark, Glendia Olshefski L, PA-C 05/31/18 [phone]

## 2018-05-31 NOTE — Telephone Encounter (Signed)
Called pharmacy to review scripts, pharmacy closed.  Reviewed scripts, written appropriately.

## 2018-05-31 NOTE — Discharge Instructions (Signed)
If you have chest pain or shortness of breath please go to the hospital.  Please start the medication I have provided today.

## 2018-05-31 NOTE — ED Triage Notes (Signed)
Pt presents with persistent cough with a little mucus, chest and nasal congestion, and nasal drainage.

## 2018-06-02 ENCOUNTER — Telehealth (HOSPITAL_COMMUNITY): Payer: Self-pay | Admitting: Emergency Medicine

## 2018-06-02 ENCOUNTER — Telehealth (HOSPITAL_COMMUNITY): Payer: Self-pay | Admitting: Family Medicine

## 2018-06-02 MED ORDER — GUAIFENESIN-CODEINE 100-10 MG/5ML PO SOLN: 10.0000 mL | ORAL | 0 refills | Status: DC | PRN

## 2018-06-02 MED ORDER — GUAIFENESIN-CODEINE 100-10 MG/5ML PO SOLN: 10.0000 mL | ORAL | 0 refills | Status: AC | PRN

## 2018-06-02 NOTE — Telephone Encounter (Signed)
Pt contacted and made aware of medicine changes. All questions answered.

## 2018-06-02 NOTE — Telephone Encounter (Signed)
Needs rx sent to another pharmacy

## 2018-06-02 NOTE — Telephone Encounter (Signed)
Patient called and left voicemail that pharmacy did not have cough syrup. Pharmacy contacted at [facility] in [place] and scrip canceled. Message given to Dr. Delton SeeNelson.

## 2018-06-02 NOTE — Telephone Encounter (Signed)
Cough medicine not "strong enough" Tessalon causes nausea vomiting Delsym does not work at all Will call in robitussin with codeine with precautions.   Discussed with patient

## 2018-06-11 ENCOUNTER — Telehealth: Payer: Self-pay

## 2018-06-11 NOTE — Telephone Encounter (Signed)
As per Ranee Gosselin, [employer] [facility] [place], the are still providing assistance to the patient

## 2018-06-16 ENCOUNTER — Ambulatory Visit (INDEPENDENT_AMBULATORY_CARE_PROVIDER_SITE_OTHER): Payer: Self-pay | Admitting: Psychiatry

## 2018-06-16 ENCOUNTER — Encounter (HOSPITAL_COMMUNITY): Payer: Self-pay | Admitting: Psychiatry

## 2018-06-16 VITALS — BP 142/80 | Ht 75.0 in | Wt 230.0 lb

## 2018-06-16 DIAGNOSIS — F411 Generalized anxiety disorder: Secondary | ICD-10-CM

## 2018-06-16 DIAGNOSIS — F331 Major depressive disorder, recurrent, moderate: Secondary | ICD-10-CM

## 2018-06-16 MED ORDER — CLONAZEPAM 0.5 MG PO TABS: 0.5000 mg | ORAL_TABLET | Freq: Every day | ORAL | 1 refills | Status: DC

## 2018-06-16 NOTE — Progress Notes (Signed)
Psychiatric Initial Adult Assessment   Patient Identification: Paul Jefferson MRN:  [id number]  Date of Evaluation:  06/16/2018   Referral Source: Primary care physician Dr. Lily Kocher.  Chief Complaint: I have a lot of anxiety and depression.  I do not think Ambien working from anxiety.   Visit Diagnosis:    ICD-10-CM   1. MDD (major depressive disorder), recurrent episode, moderate (HCC) F33.1 clonazePAM (KLONOPIN) 0.5 MG tablet  2. GAD (generalized anxiety disorder) F41.1 clonazePAM (KLONOPIN) 0.5 MG tablet    History of Present Illness: Augusta Propson is a 63 year old Caucasian, retired, single man who is referred from his primary care physician Mariana Arn, physician assistant for the management of depression and anxiety symptoms.  Patient struggles with anxiety and depression more than 5 years ago when he had a accident and fell at work caused fracture in his left leg.  He was unable to work since then and finally got settlement from [employer].  His depression and anxiety got worse last year when his 66 year old mother died due to breast cancer in 11-05-22 and his aunt died few weeks ago.  He also struggling with finances and now facing his home foreclosure.  He is behind payment and he is very worried about his future.  He is having crying spells, feeling of hopelessness, worthlessness, anhedonia, excessive fear and sometimes panic attack.  He has decreased energy, fatigue, social isolation and hopelessness.  Though he denies any suicidal thoughts or homicidal thought but endorsed lot of negative thoughts.  He has limited social network.  He lives by himself.  He never married and he has no children.  He has a brother who lives on [address] which he has not seen since the funeral of the mother.  He has a sister who lives in [place] and another sister who lives in [place].  He talked to his one sister on a regular basis.  He has been taking Lexapro and Abilify which was switched from previous  antidepressant few months ago.  He noticed medicine is working better as he has no more severe depression but he continued to struggle with anxiety, nervousness and fear.  He used to take Klonopin for many years until recently his new provider switched to Ambien.  He does not see any improvement with the Ambien 5 mg.  He has struggle sleeping and having a lot of racing thoughts and anxiety attacks in the night.  Patient has multiple health issues including chronic pain, fatigue, tingling and numbness, gout and hypothyroidism.  He is not taking Lyrica as he cannot afford but also he does not feel it helped his chronic pain.  He worried about his future because he is not sure where he is going to live if he leaves the house.  He tried working with legal aid but is not very hopeful.  He felt the Klonopin help his anxiety attacks sleep and nervousness.  He like to go back on Klonopin.  Patient admitted history of drinking in the past with DUI in 2005.  He still drinks on and off but denies any recent intoxication, blackouts or any withdrawal symptoms.  His last drink was few months ago.  He understand that drinking and psychotropic medication cannot go together.  He is also not seeing any therapist but willing to see someone to help his coping skills.  Patient denies any mania, psychosis, hallucination, paranoia, nightmares, OCD or any PTSD symptoms.  He admitted significant weight loss more than 40 pounds since the  May.  He used to exercise but lately he has no energy and motivation to do things.  Patient is tolerating Lexapro 20 mg daily and Abilify 5 mg prescribed by his primary care provider without any side effects.  He has no tremors, shakes or any EPS.  He sees primary care physician on a regular basis.  Recently had blood work which is essentially normal.  Associated Signs/Symptoms: Depression Symptoms:  depressed mood, anhedonia, insomnia, psychomotor retardation, fatigue, feelings of  worthlessness/guilt, difficulty concentrating, hopelessness, anxiety, loss of energy/fatigue, disturbed sleep, weight loss, (Hypo) Manic Symptoms:  Irritable Mood, Anxiety Symptoms:  Excessive Worry, Social Anxiety, Psychotic Symptoms:  no psychotic symptoms PTSD Symptoms: Negative  Past Psychiatric History: No history of psychiatric inpatient treatment or any suicidal attempt.  Tried Zoloft and Paxil prescribed by primary care physician.  It was switched to Lexapro and Abilify in 2019 as depression got worse after the death of mother.  No history of psychosis, hallucination or paranoia.  History of DUI in 2005.  Previous Psychotropic Medications: Yes   Substance Abuse History in the last 12 months:  Yes.   history of etoh few months ago but no intoxication.  Consequences of Substance Abuse: h/o DUI in 2005  Past Medical History:  Past Medical History:  Diagnosis Date  . Depression   . Gout   . Neuromuscular disorder (HCC)   . Thyroid disease     Past Surgical History:  Procedure Laterality Date  . CHOLECYSTECTOMY      Family Psychiatric History: Mother had depression and anxiety.  Sister has depression.  Family History: No family history on file.  Social History:   Social History   Socioeconomic History  . Marital status: Single    Spouse name: Not on file  . Number of children: Not on file  . Years of education: Not on file  . Highest education level: Not on file  Occupational History  . Not on file  Social Needs  . Financial resource strain: Not on file  . Food insecurity:    Worry: Not on file    Inability: Not on file  . Transportation needs:    Medical: Not on file    Non-medical: Not on file  Tobacco Use  . Smoking status: Current Every Day Smoker    Packs/day: 0.50    Types: Cigarettes  . Smokeless tobacco: Never Used  Substance and Sexual Activity  . Alcohol use: No  . Drug use: No  . Sexual activity: Never  Lifestyle  . Physical activity:     Days per week: Not on file    Minutes per session: Not on file  . Stress: Not on file  Relationships  . Social connections:    Talks on phone: Not on file    Gets together: Not on file    Attends religious service: Not on file    Active member of club or organization: Not on file    Attends meetings of clubs or organizations: Not on file    Relationship status: Not on file  Other Topics Concern  . Not on file  Social History Narrative  . Not on file    Additional Social History: Patient [place]  He endorsed parents having argument when he was growing up.  He has been in multiple relationship but never married.  He has no children.  He works in [employer] until he got injured while at work and could not continue his job.  Allergies:   Allergies  Allergen Reactions  . Mobic [Meloxicam] Nausea Only  . Penicillins Nausea And Vomiting    Metabolic Disorder Labs: No results found for this or any previous visit (from the past 2160 hour(s)). Lab Results  Component Value Date   HGBA1C 5.5 01/16/2018   No results found for: PROLACTIN Lab Results  Component Value Date   CHOL 191 02/18/2017   TRIG 553 (HH) 02/18/2017   HDL 34 (L) 02/18/2017   CHOLHDL 5.6 (H) 02/18/2017   VLDL NOT CALC 07/11/2016   LDLCALC Comment 02/18/2017   LDLCALC NOT CALC 07/11/2016   Lab Results  Component Value Date   TSH 0.672 03/18/2018    Therapeutic Level Labs: No results found for: LITHIUM No results found for: CBMZ No results found for: VALPROATE  Current Medications: Current Outpatient Medications  Medication Sig Dispense Refill  . acetaminophen (TYLENOL) 500 MG tablet Take 2 tablets (1,000 mg total) by mouth every 8 (eight) hours as needed. 90 tablet 0  . albuterol (VENTOLIN HFA) 108 (90 Base) MCG/ACT inhaler INHALE 2 PUFFS INTO THE LUNGS EVERY 6 HOURS AS NEEDED FOR WHEEZING OR SHORTNESS OF BREATH. 54 g 3  . allopurinol (ZYLOPRIM) 300 MG tablet Take 1  tablet (300 mg total) by mouth daily. 30 tablet 6  . ARIPiprazole (ABILIFY) 5 MG tablet Take 1 tablet (5 mg total) by mouth daily. 30 tablet 2  . aspirin EC 81 MG tablet Take 1 tablet (81 mg total) by mouth daily. 30 tablet 0  . azithromycin (ZITHROMAX) 250 MG tablet Take two tablets on day one. Then take one tablet daily thereafter. 6 tablet 0  . benzonatate (TESSALON) 200 MG capsule Take 1 capsule (200 mg total) by mouth 2 (two) times daily as needed for cough. 20 capsule 0  . colchicine 0.6 MG tablet Take 1 tablet (0.6 mg total) by mouth daily. 90 tablet 0  . escitalopram (LEXAPRO) 20 MG tablet Take 1 tablet (20 mg total) by mouth daily. 30 tablet 5  . guaiFENesin-codeine 100-10 MG/5ML syrup Take 10 mLs by mouth every 4 (four) hours as needed for cough. 240 mL 0  . levothyroxine (SYNTHROID) 175 MCG tablet TAKE 1 TABLET BY MOUTH DAILY BEFORE BREAKFAST. 30 tablet 5  . pregabalin (LYRICA) 75 MG capsule Take 1 capsule (75 mg total) by mouth 2 (two) times daily. 180 capsule 1  . rosuvastatin (CRESTOR) 20 MG tablet Take 1 tablet (20 mg total) by mouth daily. 90 tablet 1  . VIAGRA 50 MG tablet Take 50 mg by mouth as needed.  3  . zolpidem (AMBIEN) 5 MG tablet Take 1 tablet (5 mg total) by mouth at bedtime as needed for sleep. 30 tablet 0   No current facility-administered medications for this visit.     Musculoskeletal: Strength & Muscle Tone: within normal limits Gait & Station: normal Patient leans: N/A  Psychiatric Specialty Exam: Review of Systems  Constitutional: Positive for malaise/fatigue and weight loss.  Musculoskeletal: Positive for joint pain.  Skin: Negative.   Neurological: Positive for tingling.  Psychiatric/Behavioral: Positive for depression. The patient is nervous/anxious and has insomnia.     Blood pressure (!) 142/80, height 6\' 3"  (1.905 m), weight 230 lb (104.3 kg).There is no height or weight on file to calculate BMI.  General Appearance: Casual  Eye Contact:  Fair   Speech:  Slow  Volume:  Decreased  Mood:  Anxious, Depressed and Dysphoric  Affect:  Constricted and Depressed  Thought Process:  Goal Directed  Orientation:  Full (Time, Place, and Person)  Thought Content:  Rumination  Suicidal Thoughts:  No  Homicidal Thoughts:  No  Memory:  Immediate;   Good Recent;   Fair Remote;   Fair  Judgement:  Good  Insight:  Good  Psychomotor Activity:  Decreased  Concentration:  Concentration: Fair and Attention Span: Fair  Recall:  Fair  Fund of Knowledge:Good  Language: Good  Akathisia:  No  Handed:  Right  AIMS (if indicated):  not done  Assets:  Communication Skills Desire for Improvement Resilience  ADL's:  Intact  Cognition: WNL  Sleep:  Fair   Screenings: GAD-7     [facility]  Total GAD-7 Score  5  2  4  3  19     PHQ2-9     [facility]  PHQ-2 Total Score  2  2  1  1  1   PHQ-9 Total Score  7  4  3  4  3       Assessment and Plan: Major depressive disorder, recurrent.  Generalized anxiety disorder.  Panic attacks.  I reviewed his symptoms, history, psychosocial stressors and current medication with blood work results.  We discussed use of benzodiazepine causing tolerance, dependency and withdrawal symptoms.  We also talked about psychotropic medication especially benzodiazepine interaction with alcohol.  Patient  claims that he has not drinking for more than few months and he has no plan to drink again.  Since taking the Lexapro and Abilify he is feeling less depressed.  However he continues to struggle with anxiety and panic attacks.  He had a good response with Klonopin.  I recommended to discontinue Ambien.  He is no longer taking Lyrica.  We will start Klonopin 0.5 mg at bedtime to help with anxiety, racing thoughts and insomnia.  He will continue Lexapro 20 mg daily and Abilify 5 mg daily which is prescribed by his primary care physician.  Discussed medication side effects and benefits in detail.  We will refer him to see a therapist for coping skills.  Discussed safety concerns at any time having active suicidal thoughts or homicidal thought that he need to call 911 or go to local emergency room.  I will see him again in 4 to 6 weeks.   Cleotis NipperSyed T Arfeen, MD 1/13/202011:25 AM

## 2018-06-25 ENCOUNTER — Ambulatory Visit (HOSPITAL_COMMUNITY): Payer: Self-pay | Admitting: Licensed Clinical Social Worker

## 2018-06-25 MED FILL — ESCITALOPRAM 20 MG TABLET: 20 | 30 days supply | Qty: 30 | Fill #3

## 2018-06-25 MED FILL — SYNTHROID 175 MCG TABLET: 175 | 30 days supply | Qty: 30 | Fill #3

## 2018-06-25 MED FILL — ARIPiprazole 5 MG TABS: 5 | 30 days supply | Qty: 30 | Fill #1

## 2018-06-26 ENCOUNTER — Telehealth (INDEPENDENT_AMBULATORY_CARE_PROVIDER_SITE_OTHER): Payer: Self-pay | Admitting: Physician Assistant

## 2018-06-26 DIAGNOSIS — G4709 Other insomnia: Secondary | ICD-10-CM

## 2018-06-26 NOTE — Telephone Encounter (Signed)
Patient called to request a med refill for zolpidem (AMBIEN) 5 MG tablet   Patient uses [facility]  Please advice [phone]  Thank you Louisa Second

## 2018-06-27 MED ORDER — ZOLPIDEM TARTRATE 5 MG PO TABS: 5.0000 mg | ORAL_TABLET | Freq: Every evening | ORAL | 0 refills | Status: DC | PRN

## 2018-06-27 NOTE — Telephone Encounter (Signed)
Please send to [facility] on [address] Paul Jefferson, CMA

## 2018-06-27 NOTE — Telephone Encounter (Signed)
Sent to Dr. Newlin. Paul Jefferson S Daveena Elmore, CMA  

## 2018-06-27 NOTE — Telephone Encounter (Signed)
[facility] pharmacy does not fill Ambien; what other pharmacy would he like?

## 2018-06-27 NOTE — Telephone Encounter (Signed)
Done

## 2018-07-21 ENCOUNTER — Telehealth (INDEPENDENT_AMBULATORY_CARE_PROVIDER_SITE_OTHER): Payer: Self-pay

## 2018-07-21 MED FILL — SYNTHROID 175 MCG TABLET: 175 | 30 days supply | Qty: 30 | Fill #4

## 2018-07-21 MED FILL — ARIPiprazole 5 MG TABS: 5 | 30 days supply | Qty: 30 | Fill #2

## 2018-07-21 MED FILL — ESCITALOPRAM 20 MG TABLET: 20 | 30 days supply | Qty: 30 | Fill #4

## 2018-07-21 NOTE — Telephone Encounter (Signed)
Patient called to request med refill for VIAGRA 50 MG tablet  Patient uses [facility]  Please advice [phone]  Patient sates he is going out of town on Wednesday and would to pick up the refill before leaving.  Thank you Louisa Second

## 2018-07-22 ENCOUNTER — Other Ambulatory Visit (INDEPENDENT_AMBULATORY_CARE_PROVIDER_SITE_OTHER): Payer: Self-pay

## 2018-07-22 DIAGNOSIS — N529 Male erectile dysfunction, unspecified: Secondary | ICD-10-CM

## 2018-07-22 MED ORDER — VIAGRA 50 MG PO TABS: 50.0000 mg | ORAL_TABLET | ORAL | 2 refills | Status: AC | PRN

## 2018-07-22 NOTE — Telephone Encounter (Signed)
Patient is aware that refill has been sent to pharmacy. Tempestt S Roberts, CMA  

## 2018-07-23 MED FILL — !VIAGRA 50 MG TABLET: 50 | 30 days supply | Qty: 10 | Fill #0

## 2018-08-13 ENCOUNTER — Ambulatory Visit (INDEPENDENT_AMBULATORY_CARE_PROVIDER_SITE_OTHER): Payer: Self-pay | Admitting: Psychiatry

## 2018-08-13 ENCOUNTER — Other Ambulatory Visit: Payer: Self-pay

## 2018-08-13 DIAGNOSIS — F411 Generalized anxiety disorder: Secondary | ICD-10-CM

## 2018-08-13 DIAGNOSIS — F331 Major depressive disorder, recurrent, moderate: Secondary | ICD-10-CM

## 2018-08-13 MED ORDER — ESCITALOPRAM OXALATE 20 MG PO TABS: 20.0000 mg | ORAL_TABLET | Freq: Every day | ORAL | 1 refills | Status: DC

## 2018-08-13 MED ORDER — HYDROXYZINE PAMOATE 25 MG PO CAPS: 25.0000 mg | ORAL_CAPSULE | Freq: Every evening | ORAL | 0 refills | Status: DC | PRN

## 2018-08-13 MED ORDER — ARIPIPRAZOLE 5 MG PO TABS: 5.0000 mg | ORAL_TABLET | Freq: Every day | ORAL | 1 refills | Status: DC

## 2018-08-13 MED ORDER — CLONAZEPAM 0.5 MG PO TABS: 0.5000 mg | ORAL_TABLET | Freq: Every day | ORAL | 0 refills | Status: DC

## 2018-08-13 MED FILL — SYNTHROID 175 MCG TABLET: 175 | 30 days supply | Qty: 30 | Fill #5

## 2018-08-13 MED FILL — ESCITALOPRAM 20 MG TABLET: 20 | 30 days supply | Qty: 30 | Fill #5

## 2018-08-13 MED FILL — hydrOXYzine HCL 25 MG TABS: 25 | 30 days supply | Qty: 30 | Fill #0

## 2018-08-13 NOTE — Progress Notes (Signed)
BH MD/PA/NP OP Progress Note  08/13/2018 2:55 PM Paul Jefferson  MRN:  [id number]  Chief Complaint: Feeling better with Klonopin.  I do not take every day but it helps my anxiety and nervousness.  HPI: Paul Jefferson came for his appointment.  He is a 63 year old Caucasian, retired single man who is referred from primary care physician Dr. Mariana Jefferson physician assistant for the management of depression and anxiety symptoms.  He is taking Abilify and Lexapro prescribed by PA but continued to have nervousness and anxiety symptoms.  We started him on Klonopin which had helped him in the past and recommended to stop the Ambien which was not helping his sleep.  He noticed much improvement in his anxiety and nervousness.  He also feels relaxed since he sold his house and now moved into apartment.  He is unable to see a therapist because he cannot afford co-pay.  He still struggle with sleep and some nights he have racing thoughts but denies any paranoia, hallucination, suicidal thoughts or homicidal thought.  He is wondering if he can take something to help his sleep since he is not taking Ambien.  He does not want to take Klonopin every night.  He denies any feeling of hopelessness or any crying spells.  He denies any agitation or any anger.  He has no tremors shakes or any EPS.  He also like to continue his Lexapro and Abilify from this office.  He recall taking hydroxyzine when he was in [place] which helped his anxiety and sleep.  He is not drinking since he finished the program.  Appetite is okay.  He start walking almost every day and that helps his energy level.  Visit Diagnosis:    ICD-10-CM   1. MDD (major depressive disorder), recurrent episode, moderate (HCC) F33.1 hydrOXYzine (VISTARIL) 25 MG capsule    escitalopram (LEXAPRO) 20 MG tablet    ARIPiprazole (ABILIFY) 5 MG tablet  2. GAD (generalized anxiety disorder) F41.1 hydrOXYzine (VISTARIL) 25 MG capsule    escitalopram (LEXAPRO) 20 MG tablet     ARIPiprazole (ABILIFY) 5 MG tablet    clonazePAM (KLONOPIN) 0.5 MG tablet    Past Psychiatric History: Reviewed. H/O ETOH, depression and anxiety.  Had DUI in 2005. Tried Zoloft, Paxil, Trazodone and Ambien from PCP. Switched to Lexapro and Abilify in 2019.  H/O rehab at [facility] for 14 days in 04/2018. No h/o suicidal attempt, inpatient treatment, psychosis, hallucination.    Past Medical History:  Past Medical History:  Diagnosis Date  . Depression   . Gout   . Neuromuscular disorder (HCC)   . Thyroid disease     Past Surgical History:  Procedure Laterality Date  . ANKLE ARTHROSCOPY WITH RECONSTRUCTION Bilateral   . CHOLECYSTECTOMY    . TONSILLECTOMY AND ADENOIDECTOMY      Family Psychiatric History: Reviewed.  Family History:  Family History  Problem Relation Age of Onset  . Depression Mother   . Depression Sister   . Depression Sister     Social History:  Social History   Socioeconomic History  . Marital status: Single    Spouse name: Not on file  . Number of children: 0  . Years of education: Not on file  . Highest education level: Not on file  Occupational History  . Not on file  Social Needs  . Financial resource strain: Not on file  . Food insecurity:    Worry: Not on file    Inability: Not on file  . Transportation  needs:    Medical: Not on file    Non-medical: Not on file  Tobacco Use  . Smoking status: Current Every Day Smoker    Packs/day: 0.50    Types: Cigarettes  . Smokeless tobacco: Never Used  Substance and Sexual Activity  . Alcohol use: No  . Drug use: No  . Sexual activity: Not Currently  Lifestyle  . Physical activity:    Days per week: Not on file    Minutes per session: Not on file  . Stress: Not on file  Relationships  . Social connections:    Talks on phone: Not on file    Gets together: Not on file    Attends religious service: Not on file    Active member of club or organization: Not on file    Attends meetings of clubs or  organizations: Not on file    Relationship status: Not on file  Other Topics Concern  . Not on file  Social History Narrative  . Not on file    Allergies:  Allergies  Allergen Reactions  . Mobic [Meloxicam] Nausea Only  . Penicillins Nausea And Vomiting    Metabolic Disorder Labs: Lab Results  Component Value Date   HGBA1C 5.5 01/16/2018   No results found for: PROLACTIN Lab Results  Component Value Date   CHOL 191 02/18/2017   TRIG 553 (HH) 02/18/2017   HDL 34 (L) 02/18/2017   CHOLHDL 5.6 (H) 02/18/2017   VLDL NOT CALC 07/11/2016   LDLCALC Comment 02/18/2017   LDLCALC NOT CALC 07/11/2016   Lab Results  Component Value Date   TSH 0.672 03/18/2018   TSH 9.060 (H) 02/18/2018    Therapeutic Level Labs: No results found for: LITHIUM No results found for: VALPROATE No components found for:  CBMZ  Current Medications: Current Outpatient Medications  Medication Sig Dispense Refill  . acetaminophen (TYLENOL) 500 MG tablet Take 2 tablets (1,000 mg total) by mouth every 8 (eight) hours as needed. 90 tablet 0  . albuterol (VENTOLIN HFA) 108 (90 Base) MCG/ACT inhaler INHALE 2 PUFFS INTO THE LUNGS EVERY 6 HOURS AS NEEDED FOR WHEEZING OR SHORTNESS OF BREATH. 54 g 3  . ARIPiprazole (ABILIFY) 5 MG tablet Take 1 tablet (5 mg total) by mouth daily. 30 tablet 2  . aspirin EC 81 MG tablet Take 1 tablet (81 mg total) by mouth daily. 30 tablet 0  . clonazePAM (KLONOPIN) 0.5 MG tablet Take 1 tablet (0.5 mg total) by mouth at bedtime. 30 tablet 1  . colchicine 0.6 MG tablet Take 1 tablet (0.6 mg total) by mouth daily. 90 tablet 0  . escitalopram (LEXAPRO) 20 MG tablet Take 1 tablet (20 mg total) by mouth daily. 30 tablet 5  . guaiFENesin-codeine 100-10 MG/5ML syrup Take 10 mLs by mouth every 4 (four) hours as needed for cough. 240 mL 0  . ibuprofen (ADVIL,MOTRIN) 800 MG tablet Take by mouth.    . levothyroxine (SYNTHROID) 175 MCG tablet TAKE 1 TABLET BY MOUTH DAILY BEFORE BREAKFAST. 30  tablet 5  . pregabalin (LYRICA) 75 MG capsule Take 1 capsule (75 mg total) by mouth 2 (two) times daily. 180 capsule 1  . VIAGRA 50 MG tablet Take 1 tablet (50 mg total) by mouth as needed. 10 tablet 2  . zolpidem (AMBIEN) 5 MG tablet Take 1 tablet (5 mg total) by mouth at bedtime as needed for sleep. 30 tablet 0   No current facility-administered medications for this visit.  Musculoskeletal: Strength & Muscle Tone: within normal limits Gait & Station: normal Patient leans: N/A  Psychiatric Specialty Exam: Review of Systems  Musculoskeletal: Positive for joint pain.  Neurological: Positive for tingling.    Blood pressure 126/74, height  (1.905 m), weight 244 lb (110.7 kg).There is no height or weight on file to calculate BMI.  General Appearance: Casual  Eye Contact:  Good  Speech:  Clear and Coherent and Slow  Volume:  Normal  Mood:  Anxious and Dysphoric  Affect:  Congruent  Thought Process:  Goal Directed  Orientation:  Full (Time, Place, and Person)  Thought Content: Rumination   Suicidal Thoughts:  No  Homicidal Thoughts:  No  Memory:  Immediate;   Good Recent;   Good Remote;   Good  Judgement:  Good  Insight:  Good  Psychomotor Activity:  Decreased  Concentration:  Concentration: Fair and Attention Span: Fair  Recall:  Good  Fund of Knowledge: Good  Language: Good  Akathisia:  No  Handed:  Right  AIMS (if indicated): not done  Assets:  Communication Skills Desire for Improvement Housing Resilience Social Support  ADL's:  Intact  Cognition: WNL  Sleep:  improved   Screenings: GAD-7     [facility] Visit from 05/13/2018 [facility] Office Visit from 02/18/2018 in [facility] Office Visit from 01[facility]16/2018 in [facility] Office Visit from 12/19/2017 in [facility] Office Visit from 11/06/2017 in [facility]  Total GAD-7 Score  PHQ2-9     Office Visit from 05/13/2018 in [facility] Office Visit from 03/18/2018 in [facility] Office Visit from 02/18/2018 in [facility] Office Visit from 01/16/2018 in [facility] Office Visit from 12/19/2017 in [facility]  PHQ-2 Total Score  PHQ-9 Total Score  Assessment and Plan: Depressive disorder, recurrent.  Generalized anxiety disorder.  Patient is doing better since started Klonopin.  He does not take Klonopin every day but continues struggle some nights with insomnia.  He is not taking Ambien and wondering if we can prescribe low-dose hydroxyzine which helped him when he was in rehab program at [facility] in November 2019.  He is not drinking or using any illegal substances.  He cannot afford therapy at this time.  I recommend to try hydroxyzine 25 mg capsule to take it as needed for insomnia.  Continue Klonopin 0.5 mg as needed for anxiety.  Continue Lexapro 20 mg daily and 5 mg Abilify.  I encouraged to explore the [facility] which is a free group therapy.  He promised that he will look into that.  He has no tremors, shakes or any EPS.  Recommended to call us back if is any question or any concern.  Follow-up in 2 months.   Cleotis Nipper, MD 08/13/2018, 2:55 PM

## 2018-09-17 ENCOUNTER — Other Ambulatory Visit (HOSPITAL_COMMUNITY): Payer: Self-pay

## 2018-09-17 DIAGNOSIS — F331 Major depressive disorder, recurrent, moderate: Secondary | ICD-10-CM

## 2018-09-17 DIAGNOSIS — F411 Generalized anxiety disorder: Secondary | ICD-10-CM

## 2018-09-17 MED ORDER — HYDROXYZINE PAMOATE 25 MG PO CAPS: 25.0000 mg | ORAL_CAPSULE | Freq: Every evening | ORAL | 0 refills | Status: DC | PRN

## 2018-09-17 MED ORDER — CLONAZEPAM 0.5 MG PO TABS: 0.5000 mg | ORAL_TABLET | Freq: Every day | ORAL | 0 refills | Status: DC

## 2018-09-17 MED FILL — hydrOXYzine PAMOATE 25 MG C: 25 | 30 days supply | Qty: 30 | Fill #0

## 2018-09-19 MED FILL — ESCITALOPRAM 20 MG TABLET: 20 | 30 days supply | Qty: 30 | Fill #0

## 2018-09-23 ENCOUNTER — Other Ambulatory Visit (HOSPITAL_COMMUNITY): Payer: Self-pay

## 2018-09-23 DIAGNOSIS — F411 Generalized anxiety disorder: Secondary | ICD-10-CM

## 2018-09-24 ENCOUNTER — Telehealth: Payer: Self-pay | Admitting: Physician Assistant

## 2018-09-24 NOTE — Telephone Encounter (Signed)
Please address this request.

## 2018-09-24 NOTE — Telephone Encounter (Signed)
Patient is followed by behavior health . Ask him to inform his therapist he is unable to sleep and will they prescribe him medication.

## 2018-09-24 NOTE — Telephone Encounter (Signed)
Pt called in wanting to see if he could get his ambien 5mg  prescribed again cause he is having trouble difficulty breathing would like it sent over to [facility] on [address]

## 2018-09-25 ENCOUNTER — Other Ambulatory Visit (INDEPENDENT_AMBULATORY_CARE_PROVIDER_SITE_OTHER): Payer: Self-pay | Admitting: Family Medicine

## 2018-09-25 DIAGNOSIS — G4709 Other insomnia: Secondary | ICD-10-CM

## 2018-09-25 NOTE — Telephone Encounter (Signed)
Called PT infromed to contact BH pt understood

## 2018-09-25 NOTE — Telephone Encounter (Signed)
Patient needs to contact behavioral health to discuss the concern and have medication prescribed.

## 2018-10-13 ENCOUNTER — Ambulatory Visit (HOSPITAL_COMMUNITY): Payer: Self-pay | Admitting: Psychiatry

## 2018-10-20 MED FILL — ?ARIPRAZOLE 5 MG TABS: 5 | 30 days supply | Qty: 30 | Fill #0

## 2018-10-20 MED FILL — ESCITALOPRAM 20 MG TABLET: 20 | 30 days supply | Qty: 30 | Fill #1

## 2018-10-22 ENCOUNTER — Other Ambulatory Visit: Payer: Self-pay

## 2018-10-22 ENCOUNTER — Ambulatory Visit (INDEPENDENT_AMBULATORY_CARE_PROVIDER_SITE_OTHER): Payer: Self-pay | Admitting: Psychiatry

## 2018-10-22 ENCOUNTER — Encounter (HOSPITAL_COMMUNITY): Payer: Self-pay | Admitting: Psychiatry

## 2018-10-22 ENCOUNTER — Telehealth: Payer: Self-pay

## 2018-10-22 DIAGNOSIS — F411 Generalized anxiety disorder: Secondary | ICD-10-CM

## 2018-10-22 DIAGNOSIS — F331 Major depressive disorder, recurrent, moderate: Secondary | ICD-10-CM

## 2018-10-22 MED ORDER — ESCITALOPRAM OXALATE 20 MG PO TABS: 20.0000 mg | ORAL_TABLET | Freq: Every day | ORAL | 2 refills | Status: AC

## 2018-10-22 MED ORDER — CLONAZEPAM 0.5 MG PO TABS: 0.5000 mg | ORAL_TABLET | Freq: Every day | ORAL | 0 refills | Status: AC

## 2018-10-22 MED ORDER — HYDROXYZINE PAMOATE 25 MG PO CAPS: 25.0000 mg | ORAL_CAPSULE | Freq: Every evening | ORAL | 2 refills | Status: AC | PRN

## 2018-10-22 MED ORDER — ARIPIPRAZOLE 5 MG PO TABS: 5.0000 mg | ORAL_TABLET | Freq: Every day | ORAL | 2 refills | Status: AC

## 2018-10-22 MED FILL — hydrOXYzine PAMOATE 25 MG C: 25 | 30 days supply | Qty: 30 | Fill #0

## 2018-10-22 NOTE — Progress Notes (Signed)
Virtual Visit via Telephone Note  I connected with Paul Jefferson on 10/22/18 at  2:40 PM EDT by telephone and verified that I am speaking with the correct person using two identifiers.   I discussed the limitations, risks, security and privacy concerns of performing an evaluation and management service by telephone and the availability of in person appointments. I also discussed with the patient that there may be a patient responsible charge related to this service. The patient expressed understanding and agreed to proceed.   History of Present Illness: Patient evaluated by phone session.  He is doing better on his medication.  He is sleeping better with hydroxyzine and his anxiety is less intense.  Is compliant with Abilify and Lexapro and reported no tremors shakes or any EPS.  He is concerned about COVID-19 as sometimes he feel nervous about the future.  He denies drinking or using any illegal substances.  He has no tremors, shakes or any EPS.  He takes Klonopin as needed for anxiety.  Does not want to change medication because he feels the current medicine working well.  His appetite is okay.  His energy level is fair.     Past Psychiatric History: Reviewed. H/O ETOH, depression and anxiety.  Had DUI in 2005. Tried Zoloft, Paxil, Trazodone and Ambien from PCP. Switched to Lexapro and Abilify in 2019.  H/O rehab at [facility] for 14 days in 04/2018. No h/o suicidal attempt, inpatient treatment, psychosis, hallucination.    Psychiatric Specialty Exam: Physical Exam  ROS  There were no vitals taken for this visit.There is no height or weight on file to calculate BMI.  General Appearance: NA  Eye Contact:  NA  Speech:  Clear and Coherent and Slow  Volume:  Normal  Mood:  Anxious  Affect:  Congruent  Thought Process:  Goal Directed  Orientation:  Full (Time, Place, and Person)  Thought Content:  Logical  Suicidal Thoughts:  No  Homicidal Thoughts:  No  Memory:  Immediate;   Good Recent;    Good Remote;   Good  Judgement:  Good  Insight:  Good  Psychomotor Activity:  NA  Concentration:  Concentration: Good and Attention Span: Good  Recall:  Good  Fund of Knowledge:  Good  Language:  Good  Akathisia:  NA  Handed:  Right  AIMS (if indicated):     Assets:  Communication Skills Desire for Improvement Housing Resilience  ADL's:  Intact  Cognition:  WNL  Sleep:   6 hrs         Assessment and Plan: Major depressive disorder, recurrent.  Generalized anxiety disorder.  Patient is doing better on his current medication.  He feels proud that he is not drinking or using any drugs.  He feels better since we started the hydroxyzine.  I will continue Lexapro 20 mg daily, Abilify 5 mg daily, Klonopin 0.5 mg as needed for anxiety and hydroxyzine 25 mg at bedtime to help his sleep.  Patient at this time not able to afford counseling.  Discussed medication side effects and benefits.  Recommended to call us back if is any question or any concern.  Follow-up in 3 months.  Follow Up Instructions:    I discussed the assessment and treatment plan with the patient. The patient was provided an opportunity to ask questions and all were answered. The patient agreed with the plan and demonstrated an understanding of the instructions.   The patient was advised to call back or seek an in-person evaluation  if the symptoms worsen or if the condition fails to improve as anticipated.  I provided 20 minutes of non-face-to-face time during this encounter.   Kathlee Nations, MD

## 2018-10-22 NOTE — Telephone Encounter (Signed)
As per Maria Ramirez Perez, [employer] [facility] [place], the patient's case is still ongoing 

## 2018-11-19 ENCOUNTER — Telehealth: Payer: Self-pay

## 2018-11-19 NOTE — Telephone Encounter (Signed)
As per Paul Jefferson, [employer] [facility] [place], the case is still open and they continue to work with the patient.  

## 2018-12-17 ENCOUNTER — Telehealth (INDEPENDENT_AMBULATORY_CARE_PROVIDER_SITE_OTHER): Payer: Self-pay

## 2018-12-17 NOTE — Telephone Encounter (Signed)
As per Maria Ramirez Perez, [employer] [facility] [place], they are still working with the patient.  

## 2019-01-07 ENCOUNTER — Telehealth: Payer: Self-pay

## 2019-01-07 NOTE — Telephone Encounter (Signed)
As per Paul Jefferson, [employer] [facility] [place], the patient's case is now closed.

## 2019-01-22 ENCOUNTER — Other Ambulatory Visit: Payer: Self-pay

## 2019-01-22 ENCOUNTER — Ambulatory Visit (HOSPITAL_COMMUNITY): Payer: Self-pay | Admitting: Psychiatry

## 2019-03-17 ENCOUNTER — Ambulatory Visit: Payer: Self-pay | Attending: Family Medicine | Admitting: Pharmacist

## 2019-03-17 ENCOUNTER — Other Ambulatory Visit: Payer: Self-pay

## 2019-03-17 DIAGNOSIS — Z23 Encounter for immunization: Secondary | ICD-10-CM

## 2019-03-17 NOTE — Progress Notes (Signed)
Patient presents for vaccination against influenza per orders of Dr. Newlin. Consent given. Counseling provided. No contraindications exists. Vaccine administered without incident.
